# Patient Record
Sex: Female | Born: 1935
Health system: Southern US, Community
[De-identification: ages and names within clinical notes are randomized; demographics above are authoritative.]

## PROBLEM LIST (undated history)

## (undated) DIAGNOSIS — K649 Unspecified hemorrhoids: Secondary | ICD-10-CM

## (undated) DIAGNOSIS — S90221A Contusion of right lesser toe(s) with damage to nail, initial encounter: Secondary | ICD-10-CM

## (undated) DIAGNOSIS — M1990 Unspecified osteoarthritis, unspecified site: Secondary | ICD-10-CM

## (undated) DIAGNOSIS — K2970 Gastritis, unspecified, without bleeding: Secondary | ICD-10-CM

## (undated) DIAGNOSIS — I10 Essential (primary) hypertension: Secondary | ICD-10-CM

## (undated) DIAGNOSIS — S82892A Other fracture of left lower leg, initial encounter for closed fracture: Secondary | ICD-10-CM

## (undated) HISTORY — PX: APPENDECTOMY: SHX54

## (undated) HISTORY — DX: Contusion of right lesser toe(s) with damage to nail, initial encounter: S90.221A

## (undated) HISTORY — PX: BACK SURGERY: SHX140

## (undated) HISTORY — DX: Unspecified hemorrhoids: K64.9

## (undated) HISTORY — DX: Other fracture of left lower leg, initial encounter for closed fracture: S82.892A

## (undated) HISTORY — PX: TRABECULECTOMY: SHX107

## (undated) HISTORY — PX: LUMBAR DISC SURGERY: SHX700

## (undated) HISTORY — DX: Gastritis, unspecified, without bleeding: K29.70

## (undated) HISTORY — PX: CRYOABLATION: SHX1415

## (undated) HISTORY — PX: ABDOMINAL HYSTERECTOMY: SHX81

---
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
## 1985-08-02 HISTORY — PX: CHOLECYSTECTOMY: SHX55

## 1986-08-02 HISTORY — PX: VAGINAL HYSTERECTOMY: SHX2639

## 1997-10-31 ENCOUNTER — Encounter (INDEPENDENT_AMBULATORY_CARE_PROVIDER_SITE_OTHER): Payer: Self-pay | Admitting: *Deleted

## 1997-10-31 LAB — CONVERTED CEMR LAB

## 1997-11-19 ENCOUNTER — Encounter: Admission: RE | Admit: 1997-11-19 | Discharge: 1997-11-19 | Payer: Self-pay | Admitting: Family Medicine

## 1997-11-27 ENCOUNTER — Encounter: Admission: RE | Admit: 1997-11-27 | Discharge: 1997-11-27 | Payer: Self-pay | Admitting: Family Medicine

## 1998-10-01 DIAGNOSIS — S82892A Other fracture of left lower leg, initial encounter for closed fracture: Secondary | ICD-10-CM

## 1998-10-01 HISTORY — DX: Other fracture of left lower leg, initial encounter for closed fracture: S82.892A

## 1998-10-16 ENCOUNTER — Encounter: Admission: RE | Admit: 1998-10-16 | Discharge: 1998-10-16 | Payer: Self-pay | Admitting: Family Medicine

## 1999-04-28 ENCOUNTER — Encounter: Admission: RE | Admit: 1999-04-28 | Discharge: 1999-04-28 | Payer: Self-pay | Admitting: Family Medicine

## 1999-05-12 ENCOUNTER — Encounter: Admission: RE | Admit: 1999-05-12 | Discharge: 1999-05-12 | Payer: Self-pay | Admitting: Family Medicine

## 1999-09-14 ENCOUNTER — Encounter: Payer: Self-pay | Admitting: Family Medicine

## 1999-09-14 ENCOUNTER — Encounter: Admission: RE | Admit: 1999-09-14 | Discharge: 1999-09-14 | Payer: Self-pay | Admitting: Family Medicine

## 1999-11-03 ENCOUNTER — Encounter: Admission: RE | Admit: 1999-11-03 | Discharge: 1999-11-03 | Payer: Self-pay | Admitting: Sports Medicine

## 2000-03-07 ENCOUNTER — Encounter: Payer: Self-pay | Admitting: Family Medicine

## 2000-03-07 ENCOUNTER — Encounter: Admission: RE | Admit: 2000-03-07 | Discharge: 2000-03-07 | Payer: Self-pay | Admitting: Obstetrics and Gynecology

## 2000-05-10 ENCOUNTER — Encounter: Admission: RE | Admit: 2000-05-10 | Discharge: 2000-05-10 | Payer: Self-pay | Admitting: Family Medicine

## 2000-05-30 ENCOUNTER — Encounter: Admission: RE | Admit: 2000-05-30 | Discharge: 2000-05-30 | Payer: Self-pay | Admitting: Family Medicine

## 2000-07-18 ENCOUNTER — Encounter: Admission: RE | Admit: 2000-07-18 | Discharge: 2000-07-18 | Payer: Self-pay | Admitting: Family Medicine

## 2000-09-19 ENCOUNTER — Encounter: Admission: RE | Admit: 2000-09-19 | Discharge: 2000-09-19 | Payer: Self-pay | Admitting: Family Medicine

## 2000-09-19 ENCOUNTER — Encounter: Payer: Self-pay | Admitting: Family Medicine

## 2000-11-15 ENCOUNTER — Encounter: Admission: RE | Admit: 2000-11-15 | Discharge: 2000-11-15 | Payer: Self-pay | Admitting: Family Medicine

## 2001-06-06 ENCOUNTER — Encounter: Admission: RE | Admit: 2001-06-06 | Discharge: 2001-06-06 | Payer: Self-pay | Admitting: Family Medicine

## 2001-09-12 ENCOUNTER — Encounter: Payer: Self-pay | Admitting: Family Medicine

## 2001-09-12 ENCOUNTER — Encounter: Admission: RE | Admit: 2001-09-12 | Discharge: 2001-09-12 | Payer: Self-pay | Admitting: Family Medicine

## 2001-12-19 ENCOUNTER — Encounter: Admission: RE | Admit: 2001-12-19 | Discharge: 2001-12-19 | Payer: Self-pay | Admitting: Family Medicine

## 2002-03-08 ENCOUNTER — Encounter: Admission: RE | Admit: 2002-03-08 | Discharge: 2002-03-08 | Payer: Self-pay | Admitting: Family Medicine

## 2002-09-14 ENCOUNTER — Encounter: Admission: RE | Admit: 2002-09-14 | Discharge: 2002-09-14 | Payer: Self-pay | Admitting: Family Medicine

## 2002-09-14 ENCOUNTER — Encounter: Payer: Self-pay | Admitting: Family Medicine

## 2003-02-12 ENCOUNTER — Encounter: Admission: RE | Admit: 2003-02-12 | Discharge: 2003-02-12 | Payer: Self-pay | Admitting: Family Medicine

## 2003-09-24 ENCOUNTER — Encounter: Admission: RE | Admit: 2003-09-24 | Discharge: 2003-09-24 | Payer: Self-pay | Admitting: Family Medicine

## 2003-09-26 ENCOUNTER — Encounter: Admission: RE | Admit: 2003-09-26 | Discharge: 2003-09-26 | Payer: Self-pay | Admitting: Family Medicine

## 2003-11-13 ENCOUNTER — Encounter: Admission: RE | Admit: 2003-11-13 | Discharge: 2003-11-13 | Payer: Self-pay | Admitting: Family Medicine

## 2003-12-03 ENCOUNTER — Encounter: Admission: RE | Admit: 2003-12-03 | Discharge: 2003-12-03 | Payer: Self-pay | Admitting: Family Medicine

## 2004-01-31 HISTORY — PX: BLADDER SUSPENSION: SHX72

## 2004-02-11 ENCOUNTER — Encounter: Admission: RE | Admit: 2004-02-11 | Discharge: 2004-02-11 | Payer: Self-pay | Admitting: Urology

## 2004-02-14 ENCOUNTER — Ambulatory Visit (HOSPITAL_BASED_OUTPATIENT_CLINIC_OR_DEPARTMENT_OTHER): Admission: RE | Admit: 2004-02-14 | Discharge: 2004-02-14 | Payer: Self-pay | Admitting: Urology

## 2004-02-14 ENCOUNTER — Ambulatory Visit (HOSPITAL_COMMUNITY): Admission: RE | Admit: 2004-02-14 | Discharge: 2004-02-14 | Payer: Self-pay | Admitting: Urology

## 2004-10-13 ENCOUNTER — Encounter: Admission: RE | Admit: 2004-10-13 | Discharge: 2004-10-13 | Payer: Self-pay | Admitting: Family Medicine

## 2005-01-26 ENCOUNTER — Ambulatory Visit: Payer: Self-pay | Admitting: Family Medicine

## 2005-10-20 ENCOUNTER — Encounter: Admission: RE | Admit: 2005-10-20 | Discharge: 2005-10-20 | Payer: Self-pay | Admitting: Family Medicine

## 2006-04-18 ENCOUNTER — Ambulatory Visit: Payer: Self-pay | Admitting: Family Medicine

## 2006-05-02 ENCOUNTER — Ambulatory Visit: Payer: Self-pay | Admitting: Family Medicine

## 2006-07-02 DIAGNOSIS — K2970 Gastritis, unspecified, without bleeding: Secondary | ICD-10-CM

## 2006-07-02 HISTORY — DX: Gastritis, unspecified, without bleeding: K29.70

## 2006-09-29 DIAGNOSIS — N6029 Fibroadenosis of unspecified breast: Secondary | ICD-10-CM

## 2006-09-29 DIAGNOSIS — E785 Hyperlipidemia, unspecified: Secondary | ICD-10-CM | POA: Insufficient documentation

## 2006-09-29 DIAGNOSIS — N819 Female genital prolapse, unspecified: Secondary | ICD-10-CM | POA: Insufficient documentation

## 2006-09-29 DIAGNOSIS — I10 Essential (primary) hypertension: Secondary | ICD-10-CM | POA: Insufficient documentation

## 2006-09-29 DIAGNOSIS — E663 Overweight: Secondary | ICD-10-CM | POA: Insufficient documentation

## 2006-09-29 DIAGNOSIS — K649 Unspecified hemorrhoids: Secondary | ICD-10-CM

## 2006-09-29 DIAGNOSIS — H409 Unspecified glaucoma: Secondary | ICD-10-CM | POA: Insufficient documentation

## 2006-09-29 DIAGNOSIS — M5430 Sciatica, unspecified side: Secondary | ICD-10-CM

## 2006-09-29 HISTORY — DX: Unspecified hemorrhoids: K64.9

## 2006-09-30 ENCOUNTER — Encounter (INDEPENDENT_AMBULATORY_CARE_PROVIDER_SITE_OTHER): Payer: Self-pay | Admitting: *Deleted

## 2006-09-30 ENCOUNTER — Ambulatory Visit: Payer: Self-pay | Admitting: Family Medicine

## 2006-09-30 ENCOUNTER — Encounter: Admission: RE | Admit: 2006-09-30 | Discharge: 2006-09-30 | Payer: Self-pay | Admitting: Sports Medicine

## 2006-10-04 ENCOUNTER — Ambulatory Visit: Payer: Self-pay | Admitting: Family Medicine

## 2006-10-14 ENCOUNTER — Telehealth: Payer: Self-pay | Admitting: Family Medicine

## 2006-10-17 ENCOUNTER — Telehealth: Payer: Self-pay | Admitting: *Deleted

## 2006-11-02 ENCOUNTER — Encounter: Payer: Self-pay | Admitting: Family Medicine

## 2006-11-02 ENCOUNTER — Encounter: Admission: RE | Admit: 2006-11-02 | Discharge: 2006-11-02 | Payer: Self-pay | Admitting: Family Medicine

## 2007-11-06 ENCOUNTER — Encounter: Admission: RE | Admit: 2007-11-06 | Discharge: 2007-11-06 | Payer: Self-pay | Admitting: Family Medicine

## 2008-01-15 ENCOUNTER — Encounter: Payer: Self-pay | Admitting: Family Medicine

## 2008-01-17 ENCOUNTER — Encounter: Payer: Self-pay | Admitting: Family Medicine

## 2008-06-13 ENCOUNTER — Ambulatory Visit: Payer: Self-pay | Admitting: Family Medicine

## 2008-06-13 LAB — CONVERTED CEMR LAB
CO2: 24 meq/L (ref 19–32)
Creatinine, Ser: 0.88 mg/dL (ref 0.40–1.20)
HCT: 47.7 % — ABNORMAL HIGH (ref 36.0–46.0)
HDL: 55 mg/dL (ref >39)
MCV: 95.2 fL (ref 78.0–100.0)
Potassium: 4 meq/L (ref 3.5–5.3)
RDW: 12.7 % (ref 11.5–15.5)
Sodium: 142 meq/L (ref 135–145)
Total CHOL/HDL Ratio: 2.8
Total Protein: 7.3 g/dL (ref 6.0–8.3)
Vit D, 1,25-Dihydroxy: 34 (ref 30–89)
WBC: 5.7 10*3/uL (ref 4.0–10.5)

## 2008-06-18 ENCOUNTER — Encounter: Payer: Self-pay | Admitting: Family Medicine

## 2008-07-22 ENCOUNTER — Telehealth: Payer: Self-pay | Admitting: *Deleted

## 2008-07-23 ENCOUNTER — Ambulatory Visit: Payer: Self-pay | Admitting: Family Medicine

## 2008-08-02 HISTORY — PX: SPIDER VEIN INJECTION: SHX783

## 2008-11-06 ENCOUNTER — Encounter: Admission: RE | Admit: 2008-11-06 | Discharge: 2008-11-06 | Payer: Self-pay | Admitting: Family Medicine

## 2009-05-13 ENCOUNTER — Ambulatory Visit: Payer: Self-pay | Admitting: Family Medicine

## 2009-05-13 LAB — CONVERTED CEMR LAB
ALT: 23 U/L
AST: 27 U/L
Albumin: 4.5 g/dL
Alkaline Phosphatase: 50 U/L
BUN: 17 mg/dL
CO2: 25 meq/L
Calcium: 10.2 mg/dL
Chloride: 101 meq/L
Cholesterol, target level: 200 mg/dL
Cholesterol: 137 mg/dL
Creatinine, Ser: 0.92 mg/dL
Glucose, Bld: 91 mg/dL
HDL goal, serum: 40 mg/dL
HDL: 59 mg/dL
LDL Cholesterol: 61 mg/dL
LDL Goal: 130 mg/dL
Potassium: 3.8 meq/L
Sodium: 142 meq/L
Total Bilirubin: 0.6 mg/dL
Total CHOL/HDL Ratio: 2.3
Total Protein: 7.2 g/dL
Triglycerides: 87 mg/dL
VLDL: 17 mg/dL

## 2009-05-15 ENCOUNTER — Encounter: Payer: Self-pay | Admitting: Family Medicine

## 2009-06-20 ENCOUNTER — Encounter: Payer: Self-pay | Admitting: Family Medicine

## 2009-07-22 DIAGNOSIS — I872 Venous insufficiency (chronic) (peripheral): Secondary | ICD-10-CM

## 2009-08-19 ENCOUNTER — Telehealth: Payer: Self-pay | Admitting: *Deleted

## 2009-08-19 ENCOUNTER — Ambulatory Visit: Payer: Self-pay | Admitting: Family Medicine

## 2009-09-09 ENCOUNTER — Telehealth: Payer: Self-pay | Admitting: Family Medicine

## 2009-11-07 ENCOUNTER — Encounter: Admission: RE | Admit: 2009-11-07 | Discharge: 2009-11-07 | Payer: Self-pay | Admitting: Family Medicine

## 2009-11-17 ENCOUNTER — Ambulatory Visit: Payer: Self-pay | Admitting: Family Medicine

## 2009-11-17 LAB — CONVERTED CEMR LAB
CO2: 28 meq/L (ref 19–32)
Calcium: 10.1 mg/dL (ref 8.4–10.5)
Chloride: 101 meq/L (ref 96–112)
Creatinine, Ser: 0.89 mg/dL (ref 0.40–1.20)
Eosinophils Absolute: 0.3 10*3/uL (ref 0.0–0.7)
Hemoglobin: 15.7 g/dL — ABNORMAL HIGH (ref 12.0–15.0)
Lymphocytes Relative: 37 % (ref 12–46)
Lymphs Abs: 2.2 10*3/uL (ref 0.7–4.0)
MCHC: 33.3 g/dL (ref 30.0–36.0)
Monocytes Relative: 7 % (ref 3–12)
Neutro Abs: 3.1 10*3/uL (ref 1.7–7.7)
Neutrophils Relative %: 52 % (ref 43–77)
Platelets: 225 10*3/uL (ref 150–400)
RBC: 4.9 M/uL (ref 3.87–5.11)
RDW: 12.8 % (ref 11.5–15.5)
WBC: 6 10*3/uL (ref 4.0–10.5)

## 2009-11-20 ENCOUNTER — Ambulatory Visit: Payer: Self-pay | Admitting: Family Medicine

## 2009-12-05 ENCOUNTER — Encounter: Admission: RE | Admit: 2009-12-05 | Discharge: 2009-12-05 | Payer: Self-pay | Admitting: Family Medicine

## 2009-12-30 ENCOUNTER — Encounter: Payer: Self-pay | Admitting: Family Medicine

## 2010-05-25 ENCOUNTER — Telehealth: Payer: Self-pay | Admitting: *Deleted

## 2010-05-25 ENCOUNTER — Encounter: Payer: Self-pay | Admitting: Family Medicine

## 2010-05-28 ENCOUNTER — Encounter: Payer: Self-pay | Admitting: Family Medicine

## 2010-05-28 ENCOUNTER — Telehealth: Payer: Self-pay | Admitting: Family Medicine

## 2010-05-28 ENCOUNTER — Ambulatory Visit: Payer: Self-pay | Admitting: Family Medicine

## 2010-05-28 LAB — CONVERTED CEMR LAB
ALT: 14 units/L (ref 0–35)
CO2: 30 meq/L (ref 19–32)
Chloride: 101 meq/L (ref 96–112)
Cholesterol: 152 mg/dL (ref 0–200)
HDL: 58 mg/dL (ref >39)
LDL Cholesterol: 74 mg/dL (ref 0–99)
Potassium: 4.8 meq/L (ref 3.5–5.3)
Sodium: 142 meq/L (ref 135–145)
Total Bilirubin: 0.6 mg/dL (ref 0.3–1.2)

## 2010-05-29 ENCOUNTER — Encounter: Payer: Self-pay | Admitting: Family Medicine

## 2010-06-07 ENCOUNTER — Encounter: Payer: Self-pay | Admitting: Family Medicine

## 2010-06-11 ENCOUNTER — Ambulatory Visit: Payer: Self-pay | Admitting: Family Medicine

## 2010-06-11 DIAGNOSIS — E739 Lactose intolerance, unspecified: Secondary | ICD-10-CM | POA: Insufficient documentation

## 2010-06-11 LAB — CONVERTED CEMR LAB
Calcium: 11.1 mg/dL — ABNORMAL HIGH (ref 8.4–10.5)
Chloride: 97 meq/L (ref 96–112)
Creatinine, Ser: 1.04 mg/dL (ref 0.40–1.20)
Glucose, Bld: 104 mg/dL — ABNORMAL HIGH (ref 70–99)
Potassium: 4.3 meq/L (ref 3.5–5.3)
Sodium: 144 meq/L (ref 135–145)

## 2010-06-12 ENCOUNTER — Encounter: Payer: Self-pay | Admitting: *Deleted

## 2010-06-15 ENCOUNTER — Encounter: Payer: Self-pay | Admitting: Family Medicine

## 2010-06-15 DIAGNOSIS — N183 Chronic kidney disease, stage 3 (moderate): Secondary | ICD-10-CM

## 2010-06-29 ENCOUNTER — Telehealth: Payer: Self-pay | Admitting: Family Medicine

## 2010-07-03 ENCOUNTER — Encounter: Payer: Self-pay | Admitting: Family Medicine

## 2010-09-03 NOTE — Miscellaneous (Signed)
Summary: Stage 3 renal insufficiency,Calcium 11.1  Clinical Lists Changes  Problems: Added new problem of CHRONIC KIDNEY DISEASE STAGE III (MODERATE) (ICD-585.3)

## 2010-09-03 NOTE — Assessment & Plan Note (Signed)
Summary: infected 5th finger/[place]/Xaidyn Kepner   Vital Signs:  Patient profile:   77 year old female Menstrual status:  hysterectomy Height:      63 inches Weight:      157 pounds BMI:     27.91 BSA:     1.75 Temp:     98.1 degrees F Pulse rate:   69 / minute BP sitting:   146 / 83  Vitals Entered By: Jone Baseman CMA (November 17, 2009 9:24 AM) CC: infected finger Is Patient Diabetic? No Pain Assessment Patient in pain? yes     Location: finger   Primary Care Provider:  Zachery Dauer  CC:  infected finger.  History of Present Illness: Got a splinter in her L little finger 7 days ago. Got it out with a sterilized pin, but it has remained swollen despite applying fat back. Hurting more.   Lost weight by portion control and limiting carbs. Also increased walking. Came in fasting in case she needs blood drawn. Hasn't been checking her blood pressure, but can measure at home.   Habits & Providers  Alcohol-Tobacco-Diet     Tobacco Status: never  Current Medications (verified): 1)  Bayer Childrens Aspirin 81 Mg Chew (Aspirin) .... Take 1 Tablet Once A Day 2)  Caltrate 600+d Plus 600-400 Mg-Unit Tabs (Calcium Carbonate-Vit D-Min) .... Take 1 Tablet By Mouth Twice A Day 3)  Hydrochlorothiazide 25 Mg Tabs (Hydrochlorothiazide) .... Take One Tablet Daily 4)  Simvastatin 20 Mg Tabs (Simvastatin) .... Take One Tab Daily 5)  Tylenol 325 Mg Tabs (Acetaminophen) .... Take 2 Tablets 3 X Day. 6)  Tramadol Hcl 50 Mg Tabs (Tramadol Hcl) .... Take 1 Tablet Every 8 Hours As Needed For Pain 7)  Enalapril Maleate 5 Mg Tabs (Enalapril Maleate) .... Take One Tablet Daily  Allergies (verified): No Known Drug Allergies  Physical Exam  General:  Thinner appearing Extremities:  left 5th finger tuft reddened and mildly swollen. Closed over entry wound. 1 cm incision made from there proximally after digital block.  Neurologic:  Normal finger sensation   Impression & Recommendations:  Problem # 1:   FOREIGN BODY, FINGER (ICD-915.6)  Splinter removed. No antibiotic unless symptoms of infection develop  Orders: [facility]- Est Level  3 ([address])  Problem # 2:  OVERWEIGHT (ICD-278.02)  Congratulated on her weight loss. Goal of 7 pounds over 4 months discussed. Set action plan next visit  Orders: [facility]- Est Level  3 (60454)  Problem # 3:  RIB PAIN, LEFT SIDED (ICD-786.50)  Finally improving.   Orders: [facility]- Est Level  3 (09811)  Problem # 4:  HYPERTENSION, BENIGN SYSTEMIC (ICD-401.1) Still elevated. She'll check at home and we'll remeasure in 3 days to decide if medication increase indicated.  Her updated medication list for this problem includes:    Hydrochlorothiazide 25 Mg Tabs (Hydrochlorothiazide) .Marland Kitchen... Take one tablet daily    Enalapril Maleate 5 Mg Tabs (Enalapril maleate) .Marland Kitchen... Take one tablet daily  Orders: Basic Met-[facility] [phone]) CBC w/Diff-[facility] ([address]) [facility]- Est Level  3 (57846)  Complete Medication List: 1)  Bayer Childrens Aspirin 81 Mg Chew (Aspirin) .... Take 1 tablet once a day 2)  Caltrate 600+d Plus 600-400 Mg-unit Tabs (Calcium carbonate-vit d-min) .... Take 1 tablet by mouth twice a day 3)  Hydrochlorothiazide 25 Mg Tabs (Hydrochlorothiazide) .... Take one tablet daily 4)  Simvastatin 20 Mg Tabs (Simvastatin) .... Take one tab daily 5)  Tylenol 325 Mg Tabs (Acetaminophen) .... Take 2 tablets 3 x day. 6)  Tramadol Hcl 50 Mg Tabs (Tramadol hcl) .... Take 1 tablet every 8 hours as needed for pain 7)  Enalapril Maleate 5 Mg Tabs (Enalapril maleate) .... Take one tablet daily  Patient Instructions: 1)  Please come to [facility] in 3 days to check your finger and blood pressure.  Call if any signs of infection  Change the dressing tomorrow, sooner if it bleeds through. Apply antibiotic ointment to the cut with dressing changes.  2)  Your blood pressure today is 146/83  Procedure Note Last Tetanus: given (06/13/2008)  Foreign Body Removal: Date of onset:  11/10/2009 Indication: inflamed lesion Work related: no  Procedure # 1: incision-dissection & FB removal    Type of foreign body: wood    Region: palmar    Location: hand-palmar-left    Comment: fifth finger tip, no pus,  1.5 cm thin spinter removed    Instrument used: #11 blade-splinter forceps    Anesthesia: 1% lidocaine w/o epinephrine  Cleaned and prepped with: alcohol and betadine Wound dressing: bulky gauze dressing Instructions: daily dressing changes and RTC in 3 days  Prescriptions: SIMVASTATIN 20 MG TABS (SIMVASTATIN) Take one tab daily  #90 x 3   Entered and Authorized by:   Myrtie Soman  MD   Signed by:   Zachery Dauer MD on 11/17/2009   Method used:   Historical   RxID:   [id number]     Prevention & Chronic Care Immunizations   Influenza vaccine: Historical  (05/13/2009)   Influenza vaccine due: 04/26/2009    Tetanus booster: 06/13/2008: given   Tetanus booster due: 06/13/2018    Pneumococcal vaccine: Done.  (07/08/1998)   Pneumococcal vaccine due: None    H. zoster vaccine: Not documented  Colorectal Screening   Hemoccult: Done.  (01/30/2005)   Hemoccult due: Not Indicated    Colonoscopy: Done.  (06/02/2006)   Colonoscopy due: 06/02/2016  Other Screening   Pap smear: Done.  (10/31/1997)   Pap smear due: Not Indicated    Mammogram: ASSESSMENT: Negative - BI-RADS 1^MM DIGITAL SCREENING  (11/07/2009)   Mammogram due: 11/08/2010    DXA bone density scan: Done.  (03/02/2002)   DXA scan due: None    Smoking status: never  (11/17/2009)  Lipids   Total Cholesterol: 137  (05/13/2009)   Lipid panel action/deferral: Lipid Panel ordered   LDL: 61  (05/13/2009)   LDL Direct: Not documented   HDL: 59  (05/13/2009)   Triglycerides: 87  (05/13/2009)   Lipid panel due: 05/13/2010    SGOT (AST): 27  (05/13/2009)   SGPT (ALT): 23  (05/13/2009)   Alkaline phosphatase: 50  (05/13/2009)   Total bilirubin: 0.6  (05/13/2009)   Liver panel due:  05/13/2010  Hypertension   Last Blood Pressure: 146 / 83  (11/17/2009)   Serum creatinine: 0.92  (05/13/2009)   Serum potassium 3.8  (05/13/2009)  Self-Management Support :   Personal Goals (by the next clinic visit) :      Personal blood pressure goal: 140/90  (05/13/2009)     Personal LDL goal: 130  (05/13/2009)    Hypertension self-management support: BP self-monitoring log  (05/13/2009)    Lipid self-management support: Education handout  (05/13/2009)

## 2010-09-03 NOTE — Assessment & Plan Note (Signed)
Summary: recheck finger and bp /KH  Nurse Visit   Vital Signs:  Patient profile:   77 year old female Menstrual status:  hysterectomy Height:      63 inches Weight:      152 pounds BMI:     27.02 Temp:     97.6 degrees F oral Pulse rate:   72 / minute BP sitting:   151 / 80  (left arm) Cuff size:   regular  Vitals Entered By: Tessie Fass CMA (November 20, 2009 9:19 AM)  Serial Vital Signs/Assessments:  Time      Position  BP       Pulse  Resp  Temp     By                     126/76                         Zachery Dauer MD   Primary Care Provider:  Zachery Dauer  CC:  F/U splinter removal from finger and Hypertension Management.  History of Present Illness: The finger is improved without signs of infection.   Still hurting left side of chest.   Hypertension History:      She complains of peripheral edema and visual symptoms, but denies headache, chest pain, palpitations, dyspnea with exertion, orthopnea, PND, neurologic problems, syncope, and side effects from treatment.  She notes no problems with any antihypertensive medication side effects.  Further comments include: Usually wears support stockings Blurry vision Opth May 5 for glaucoma laser follow-up .        Positive major cardiovascular risk factors include female age 61 years old or older, hyperlipidemia, and hypertension.  Negative major cardiovascular risk factors include no history of diabetes and non-tobacco-user status.        Further assessment for target organ damage reveals no history of ASHD, stroke/TIA, or peripheral vascular disease.     CC: F/U splinter removal from finger, Hypertension Management Pain Assessment Patient in pain? no        Current Medications (verified): 1)  Bayer Childrens Aspirin 81 Mg Chew (Aspirin) .... Take 1 Tablet Once A Day 2)  Caltrate 600+d Plus 600-400 Mg-Unit Tabs (Calcium Carbonate-Vit D-Min) .... Take 1 Tablet By Mouth Twice A Day 3)  Hydrochlorothiazide 25 Mg Tabs  (Hydrochlorothiazide) .... Take One Tablet Daily 4)  Simvastatin 20 Mg Tabs (Simvastatin) .... Take One Tab Daily 5)  Tylenol 325 Mg Tabs (Acetaminophen) .... Take 2 Tablets 3 X Day. 6)  Tramadol Hcl 50 Mg Tabs (Tramadol Hcl) .... Take 1 Tablet Every 8 Hours As Needed For Pain 7)  Enalapril Maleate 5 Mg Tabs (Enalapril Maleate) .... Take One Tablet Daily  Allergies (verified): No Known Drug Allergies  Orders Added: 1)  [facility]- Est Level  3 [72536] 2)  Dexa scan [Dexa scan]   Prevention & Chronic Care Immunizations   Influenza vaccine: Historical  (05/13/2009)   Influenza vaccine due: 04/26/2009    Tetanus booster: 06/13/2008: given   Tetanus booster due: 06/13/2018    Pneumococcal vaccine: Done.  (07/08/1998)   Pneumococcal vaccine due: None    H. zoster vaccine: Not documented  Colorectal Screening   Hemoccult: Done.  (01/30/2005)   Hemoccult due: Not Indicated    Colonoscopy: Done.  (06/02/2006)   Colonoscopy due: 06/02/2016  Other Screening   Pap smear: Done.  (10/31/1997)   Pap smear due: Not Indicated    Mammogram: ASSESSMENT: Negative -  BI-RADS 1^MM DIGITAL SCREENING  (11/07/2009)   Mammogram due: 11/08/2010    DXA bone density scan: Done.  (03/02/2002)   DXA scan due: None    Smoking status: never  (11/17/2009)  Lipids   Total Cholesterol: 137  (05/13/2009)   Lipid panel action/deferral: Lipid Panel ordered   LDL: 61  (05/13/2009)   LDL Direct: Not documented   HDL: 59  (05/13/2009)   Triglycerides: 87  (05/13/2009)   Lipid panel due: 05/13/2010    SGOT (AST): 27  (05/13/2009)   SGPT (ALT): 23  (05/13/2009)   Alkaline phosphatase: 50  (05/13/2009)   Total bilirubin: 0.6  (05/13/2009)   Liver panel due: 05/13/2010    Lipid flowsheet reviewed?: Yes   Progress toward LDL goal: At goal  Hypertension   Last Blood Pressure: 151 / 80  (11/20/2009)   Serum creatinine: 0.89  (11/17/2009)   Serum potassium 4.8  (11/17/2009)    Hypertension flowsheet  reviewed?: Yes   Progress toward BP goal: Improved   Hypertension comments: BP at home 4/20 was 126/76  Self-Management Support :   Personal Goals (by the next clinic visit) :      Personal blood pressure goal: 140/90  (05/13/2009)     Personal LDL goal: 130  (05/13/2009)    Hypertension self-management support: BP self-monitoring log  (05/13/2009)    Lipid self-management support: Education handout  (05/13/2009)      Patient Instructions: 1)  Please schedule a follow-up appointment in 6 months .  2)  Please return for a FASTING Lipid Profile one(1) week before your next appointment as scheduled.  3)  Call the office when nearing time to refill HDTZ and Enalapril and we'll order a combo pill 4)  Limit your Sodium(salt) .     Physical Exam  General:  alert.  Thinner Chest Wall:  Tender with pressure over left lower lateral ribs Lungs:  Normal respiratory effort, chest expands symmetrically. Lungs are clear to auscultation, no crackles or wheezes.  Good air movement throughout all lobes bilateral including site of injury. Heart:  Normal rate and regular rhythm. S1 and S2 normal without gallop, murmur, click, rub or other extra sounds. Extremities:  left 5th finger tuft wound closed without signs of infection 1+ left pedal edema and 1+ right pedal edema.        Impression & Recommendations:  Problem # 1:  FOREIGN BODY, FINGER (ICD-915.6) healing without infection Orders: [facility]- Est Level  3 ([address])  Problem # 2:  RIB PAIN, LEFT SIDED (ICD-786.50) Persists. Due for Dexa and will make sure osteoporosis not contributing to slow healing Orders: [facility]- Est Level  3 (99213) Dexa scan (Dexa scan)  Problem # 3:  HYPERTENSION, BENIGN SYSTEMIC (ICD-401.1) Consider increasing Enalapril to 10 mg if remains elevated, but home measure reassuring Her updated medication list for this problem includes:    Hydrochlorothiazide 25 Mg Tabs (Hydrochlorothiazide) .Marland Kitchen... Take one tablet daily     Enalapril Maleate 5 Mg Tabs (Enalapril maleate) .Marland Kitchen... Take one tablet daily  Orders: [facility]- Est Level  3 (60454)  Problem # 4:  OVERWEIGHT (ICD-278.02) Congratulated on weight loss.  Orders: [facility]- Est Level  3 (09811)  Complete Medication List: 1)  Bayer Childrens Aspirin 81 Mg Chew (Aspirin) .... Take 1 tablet once a day 2)  Caltrate 600+d Plus 600-400 Mg-unit Tabs (Calcium carbonate-vit d-min) .... Take 1 tablet by mouth twice a day 3)  Hydrochlorothiazide 25 Mg Tabs (Hydrochlorothiazide) .... Take one tablet daily 4)  Simvastatin  20 Mg Tabs (Simvastatin) .... Take one tab daily 5)  Tylenol 325 Mg Tabs (Acetaminophen) .... Take 2 tablets 3 x day. 6)  Tramadol Hcl 50 Mg Tabs (Tramadol hcl) .... Take 1 tablet every 8 hours as needed for pain 7)  Enalapril Maleate 5 Mg Tabs (Enalapril maleate) .... Take one tablet daily  Hypertension Assessment/Plan:      The patient's hypertensive risk group is category B: At least one risk factor (excluding diabetes) with no target organ damage.  Her calculated 10 year risk of coronary heart disease is 9 %.  Today's blood pressure is 151/80.  Her blood pressure goal is < 140/90.

## 2010-09-03 NOTE — Progress Notes (Signed)
Summary: triage  Phone Note Call from Patient Call back at Home Phone [phone]   Caller: Patient Summary of Call: Pt fell last Friday and has hurting in back and wondered if she can be seen today. Initial call taken by: Clydell Hakim,  August 19, 2009 9:45 AM  Follow-up for Phone Call        taking advil. fell onto back. able to move everything well. hurts to breathe deeply. wearing a back brace to help pain. work in at 1:30. aware she will not be seeing her md & that there will be a wait. husband will drive her Follow-up by: Golden Circle RN,  August 19, 2009 9:52 AM

## 2010-09-03 NOTE — Miscellaneous (Signed)
  Clinical Lists Changes  Observations: Added new observation of FLU VAX: Historical (05/05/2010 12:15)      Immunization History:  Influenza Immunization History:    Influenza:  historical (05/05/2010)

## 2010-09-03 NOTE — Progress Notes (Signed)
Summary: Rx Req Enalapril  Phone Note Refill Request Call back at Home Phone [phone] Message from:  Patient  Refills Requested: Medication #1:  ENALAPRIL MALEATE 5 MG TABS Take one tablet daily [facility]  Initial call taken by: Clydell Hakim,  May 28, 2010 11:35 AM    New/Updated Medications: ENALAPRIL MALEATE 10 MG TABS (ENALAPRIL MALEATE) Take one tab daily Prescriptions: ENALAPRIL MALEATE 10 MG TABS (ENALAPRIL MALEATE) Take one tab daily  #30 x 11   Entered and Authorized by:   Zachery Dauer MD   Signed by:   Zachery Dauer MD on 05/28/2010   Method used:   Electronically to        [facility]* (retail)       [address]       [address] Shipping/mailing       [place], [place]  [address]       Ph: [phone]       Fax: [phone]   RxID:   [id number]  Use of larger tabs discussed with the patient by phone. She only has enough of the 5 mg to last 5 days.

## 2010-09-03 NOTE — Letter (Signed)
Summary: Results Follow-up Letter  [facility]  [address]   [place], [place] [address]   Phone: [phone]  Fax: [phone]    06/15/2010  [address] [place], [place]  [address]  Dear Ms. Dada,   The following are the results of your recent test(s):  Patient: Amanda Shaw  Tests: (1) BMP with Estimated GFR (2404)   Sodium                    144 mEq/L                   135-145   Potassium                 4.3 mEq/L                   3.5-5.3   Chloride                  97 mEq/L                    96-112   CO2                       32 mEq/L                    19-32   Glucose              [H]  104 mg/dL                   29-52   BUN                       21 mg/dL                    8-41   Creatinine                1.04 mg/dL                  0.40-1.20   Calcium              [H]  11.1 mg/dL                  3.2-44.0 ! Est GFR, NonAfrican American                        [L]  51 mL/min                   >60 Your calcium is mildly elevated and should be rechecked. Stop taking the Calcium and vitamin D and schedule a blood test in about a month.   Document Creation Date: 06/12/2010 12:38 AM _______________________________________________________________________  Sincerely,  Zachery Dauer MD [facility]           Appended Document: Results Follow-up Letter    Clinical Lists Changes  Orders: Added new Test order of Basic Met-[facility] [phone]) - Signed      Appended Document: Results Follow-up Letter mailed

## 2010-09-03 NOTE — Letter (Signed)
Summary: Results Follow-up Letter  [facility]  [address]   [place], [place] [address]   Phone: [phone]  Fax: [phone]    12/30/2009  [address] [place], [place]  [address]  Dear Ms. Fujiwara,   The following are the results of your recent test(s):  The bones in your forearm are well calcified (no loss of bone)whereas that in your hip showed osteopenia (mild loss of bone). Your should continue the calcium and vitamin D plus walking. We should repeat the test in about 3 years.   Sincerely,  Zachery Dauer MD [facility]            Appended Document: Results Follow-up Letter mailed letter to pt

## 2010-09-03 NOTE — Miscellaneous (Signed)
Summary:  shingles vaccine  Clinical Lists Changes received notification from [facility], [address]  that patient received Zostavax on 07/02/2010 . Theresia Lo RN  July 03, 2010 8:34 AM  Observations: Added new observation of ZOSTAVAX: Zostavax (07/02/2010 8:35)      Other Immunization History:    Zostavax # 1:  Zostavax (07/02/2010)

## 2010-09-03 NOTE — Progress Notes (Signed)
Summary: BP med increase   Phone Note Outgoing Call   Call placed by: Zachery Dauer MD,  September 09, 2009 10:37 AM Call placed to: Patient Summary of Call: BP's she recorded at home predominately above 140 systolic with highest 162, diastolics all under 86. She feels well. Plan: add Enalapril 5 mg one tab daily. She will call for follow-up appointment in one month.  Initial call taken by: patient    New/Updated Medications: ENALAPRIL MALEATE 5 MG TABS (ENALAPRIL MALEATE) Take one tablet daily

## 2010-09-03 NOTE — Assessment & Plan Note (Signed)
Summary: CPE/TS   Vital Signs:  Patient profile:   77 year old female Menstrual status:  hysterectomy Height:      63 inches Weight:      157.1 pounds BMI:     27.93 Pulse rate:   82 / minute BP sitting:   162 / 96  (right arm) Cuff size:   regular  Vitals Entered By: Arlyss Repress CMA, (May 28, 2010 8:39 AM)  Serial Vital Signs/Assessments:  Time      Position  BP       Pulse  Resp  Temp     By 8:53 AM             152/94                         Zachery Dauer MD  CC: CPE. refill meds. Is Patient Diabetic? No Pain Assessment Patient in pain? no        Primary Care Provider:  Zachery Dauer  CC:  CPE. refill meds..  History of Present Illness: Started nasal cogestion and took Coricidine HBP cough and cold Acetaminophen and Chlorpheniramine) last evening. This morning throat congestion, cough. No sore throat, ear ache, myalgias. No ill contacts.   Bumps on anterior neck and R arm for a few days. New one this AM, thinks they are mosquito bites  No back pain.   blood pressures at home systolics 140/?. Didn't take meds this AM.   Sees opthalmologist, Dr Dione Booze annually. No need for glaucoma drops since laser surgery.   Habits & Providers  Alcohol-Tobacco-Diet     Tobacco Status: never  Current Medications (verified): 1)  Bayer Childrens Aspirin 81 Mg Chew (Aspirin) .... Take 1 Tablet Once A Day 2)  Caltrate 600+d Plus 600-400 Mg-Unit Tabs (Calcium Carbonate-Vit D-Min) .... Take 1 Tablet By Mouth Twice A Day 3)  Hydrochlorothiazide 25 Mg Tabs (Hydrochlorothiazide) .... Take One Tablet Daily 4)  Simvastatin 20 Mg Tabs (Simvastatin) .... Take One Tab Daily 5)  Tylenol 325 Mg Tabs (Acetaminophen) .... Take 2 Tablets 3 X Day. 6)  Enalapril Maleate 5 Mg Tabs (Enalapril Maleate) .... Take One Tablet Daily  Allergies (verified): No Known Drug Allergies  Physical Exam  General:  alert and well-nourished.   Nasal congestion voice Eyes:  pupils equal, pupils round, pupils  reactive to light, and conjunctival injection.   Ears:  R ear normal and L ear normal.   Nose:  mucosal erythema.   Mouth:  Moist mucous membranes.  Mild erythema, no exudate. Lower dentures Neck:  No deformities, masses, or tenderness noted. Lungs:  Normal respiratory effort, chest expands symmetrically. Lungs are clear to auscultation, no crackles or wheezes. Heart:  Normal rate and regular rhythm. S1 and S2 normal without gallop, murmur, click, rub or other extra sounds. Abdomen:  Bowel sounds positive,abdomen soft and non-tender without masses, organomegaly or hernias noted.   Extremities:  Wearing compression stockings Skin:  Excoriated papules 2 anterior neck and 3 right upper arm suggestive of insect bites Psych:  Cognition and judgment appear intact. Alert and cooperative with normal attention span and concentration. No apparent delusions, illusions, hallucinations   Impression & Recommendations:  Problem # 1:  HYPERTENSION, BENIGN SYSTEMIC (ICD-401.1)  Not well controlled, increase Enalapril Her updated medication list for this problem includes:    Hydrochlorothiazide 25 Mg Tabs (Hydrochlorothiazide) .Marland Kitchen... Take one tablet daily    Enalapril Maleate 10 Mg Tabs (Enalapril maleate) .Marland Kitchen... Take one tab daily  Orders: [facility]- Est  Level 4 (74259)  Problem # 2:  URI (ICD-465.9)  Her updated medication list for this problem includes:    Bayer Childrens Aspirin 81 Mg Chew (Aspirin) .Marland Kitchen... Take 1 tablet once a day    Tylenol 325 Mg Tabs (Acetaminophen) .Marland Kitchen... Take 2 tablets 3 x day.    Coricidin Hbp Cough/cold 4-30 Mg Tabs (Chlorpheniramine-dm) .Marland Kitchen... Take one tablet daily at bedtime  Orders: [facility]- Est  Level 4 (56387)  Problem # 3:  HYPERLIPIDEMIA (ICD-272.4)  Adjust based on labs today Her updated medication list for this problem includes:    Simvastatin 20 Mg Tabs (Simvastatin) .Marland Kitchen... Take one tab daily  Orders: [facility]- Est  Level 4 (56433)  Problem # 4:  SCIATICA  (ICD-724.3)  Improved The following medications were removed from the medication list:    Tramadol Hcl 50 Mg Tabs (Tramadol hcl) .Marland Kitchen... Take 1 tablet every 8 hours as needed for pain Her updated medication list for this problem includes:    Bayer Childrens Aspirin 81 Mg Chew (Aspirin) .Marland Kitchen... Take 1 tablet once a day    Tylenol 325 Mg Tabs (Acetaminophen) .Marland Kitchen... Take 2 tablets 3 x day.  Orders: [facility]- Est  Level 4 (99214)  Problem # 5:  GLAUCOMA, HX OF (ICD-V13.8) better after laser surgery, Not on drops.   Complete Medication List: 1)  Bayer Childrens Aspirin 81 Mg Chew (Aspirin) .... Take 1 tablet once a day 2)  Caltrate 600+d Plus 600-400 Mg-unit Tabs (Calcium carbonate-vit d-min) .... Take 1 tablet by mouth twice a day 3)  Hydrochlorothiazide 25 Mg Tabs (Hydrochlorothiazide) .... Take one tablet daily 4)  Simvastatin 20 Mg Tabs (Simvastatin) .... Take one tab daily 5)  Tylenol 325 Mg Tabs (Acetaminophen) .... Take 2 tablets 3 x day. 6)  Enalapril Maleate 10 Mg Tabs (Enalapril maleate) .... Take one tab daily 7)  Zostavax 29518 Unt/0.13ml Solr (Zoster vaccine live) .... Inject x 1 8)  Coricidin Hbp Cough/cold 4-30 Mg Tabs (Chlorpheniramine-dm) .... Take one tablet daily at bedtime  Patient Instructions: 1)  Increase Enalapril to two 5 mg tablets daily in addition to the HCTZ one daily.  2)  Please schedule a follow-up appointment in 2 weeks.  3)  Walk 30 minutes 3 x weekly in good shoes 4)  Continue Coricidine 1-2 tabs at bedtime. Could Loratadine 10 mg one daily to dry up nose. Can take Acetaminophen (Tylenol) but not at same time as Coricidine 5)  Get plenty of rest, drink lots of clear liquids, and use Tylenol or Ibuprofen for fever and comfort. Return in 7-10 days if you're not better: sooner if you'er feeling worse.  Prescriptions: ZOSTAVAX 84166 UNT/0.65ML SOLR (ZOSTER VACCINE LIVE) inject x 1  #1 x 0   Entered and Authorized by:   Zachery Dauer MD   Signed by:   Zachery Dauer MD on  05/28/2010   Method used:   Print then Give to Patient   RxID:   [id number] SIMVASTATIN 20 MG TABS (SIMVASTATIN) Take one tab daily  #90 x 3   Entered and Authorized by:   Zachery Dauer MD   Signed by:   Zachery Dauer MD on 05/28/2010   Method used:   Electronically to        [facility] [phone]* (retail)       [address]       [place], [place]  [address]       Ph: [phone]       Fax: [phone]  RxID:   [id number]    Orders Added: 1)  [facility]- Est  Level 4 [[address]]     Prevention & Chronic Care Immunizations   Influenza vaccine: Historical  (05/13/2009)   Influenza vaccine due: 04/26/2009    Tetanus booster: 06/13/2008: given   Tetanus booster due: 06/13/2018    Pneumococcal vaccine: Done.  (07/08/1998)   Pneumococcal vaccine due: None    H. zoster vaccine: Not documented  Colorectal Screening   Hemoccult: Done.  (01/30/2005)   Hemoccult due: Not Indicated    Colonoscopy: Done.  (06/02/2006)   Colonoscopy due: 06/02/2016  Other Screening   Pap smear: Done.  (10/31/1997)   Pap smear due: Not Indicated    Mammogram: ASSESSMENT: Negative - BI-RADS 1^MM DIGITAL SCREENING  (11/07/2009)   Mammogram due: 11/08/2010    DXA bone density scan: Done.  (03/02/2002)   DXA scan due: None    Smoking status: never  (05/28/2010)  Lipids   Total Cholesterol: 137  (05/13/2009)   Lipid panel action/deferral: Lipid Panel ordered   LDL: 61  (05/13/2009)   LDL Direct: Not documented   HDL: 59  (05/13/2009)   Triglycerides: 87  (05/13/2009)   Lipid panel due: 05/13/2010    SGOT (AST): 27  (05/13/2009)   SGPT (ALT): 23  (05/13/2009)   Alkaline phosphatase: 50  (05/13/2009)   Total bilirubin: 0.6  (05/13/2009)   Liver panel due: 05/13/2010    Lipid flowsheet reviewed?: Yes   Progress toward LDL goal: At goal  Hypertension   Last Blood Pressure: 162 / 96  (05/28/2010)   Serum creatinine: 0.89  (11/17/2009)   Serum potassium 4.8  (11/17/2009)     Hypertension flowsheet reviewed?: Yes   Progress toward BP goal: Deteriorated  Self-Management Support :   Personal Goals (by the next clinic visit) :      Personal blood pressure goal: 140/90  (05/13/2009)     Personal LDL goal: 130  (05/13/2009)    Patient will work on the following items until the next clinic visit to reach self-care goals:     Medications and monitoring: take my medicines every day, check my blood pressure  (05/28/2010)     Eating: eat foods that are low in salt  (05/28/2010)     Activity: take a 30 minute walk every day  (05/28/2010)    Hypertension self-management support: BP self-monitoring log  (05/13/2009)    Lipid self-management support: Education handout  (05/13/2009)

## 2010-09-03 NOTE — Miscellaneous (Signed)
Summary: Procedure Consent   Procedure Consent   Imported By: Bradly Bienenstock 11/17/2009 17:10:10  _____________________________________________________________________  External Attachment:    Type:   Image     Comment:   External Document

## 2010-09-03 NOTE — Progress Notes (Signed)
Summary: refill simvastatin  Phone Note Refill Request Call back at [phone] Message from:  Patient  Refills Requested: Medication #1:  SIMVASTATIN 20 MG TABS Take one tab daily   Supply Requested: 3 months [facility] [address]  Initial call taken by: De Nurse,  June 29, 2010 10:12 AM    Prescriptions: SIMVASTATIN 20 MG TABS (SIMVASTATIN) Take one tab daily  #90 x 11   Entered and Authorized by:   Zachery Dauer MD   Signed by:   Zachery Dauer MD on 06/29/2010   Method used:   Electronically to        [facility] [phone]* (retail)       [address]       [place], [place]  [address]       Ph: [phone]       Fax: [phone]   RxID:   [id number]

## 2010-09-03 NOTE — Miscellaneous (Signed)
Summary: walk in  Clinical Lists Changes came in with c/o finger infection. got a splinter in 5th finger of L hand last Tuesday. now red, swollen & painful. warm to touch. placed in 8:30 work in slot.[place]en Circle RN  November 17, 2009 8:52 AM

## 2010-09-03 NOTE — Letter (Signed)
Summary: Results Follow-up Letter  [facility]  [address]   [place], [place] [address]   Phone: [phone]  Fax: [phone]    05/29/2010  [address] [place], [place]  [address]  Dear [name],   The following are the results of your recent test(s): Patient: Amanda Shaw Note: All result statuses are Final unless otherwise noted.  Tests: (1) CMP with Estimated GFR (2402)   Order Note: FASTING   Sodium                    142 mEq/L                   135-145   Potassium                 4.8 mEq/L                   3.5-5.3   Chloride                  101 mEq/L                   96-112   CO2                       30 mEq/L                    19-32   Glucose              [H]  113 mg/dL                   62-95   BUN                       18 mg/dL                    2-84   Creatinine                1.01 mg/dL                  0.40-1.20   Bilirubin, Total          0.6 mg/dL                   1.3-2.4   Alkaline Phosphatase      55 U/L                      39-117   AST/SGOT                  21 U/L                      0-37   ALT/SGPT                  14 U/L                      0-35   Total Protein             7.5 g/dL                    4.0-1.0   Albumin                   4.5 g/dL  3.5-5.2   Calcium                   10.2 mg/dL                  1.6-10.9 ! Est GFR, African American                             >60 mL/min                  >60 ! Est GFR, NonAfrican American                        [L]  53 mL/min                   >60 Your blood sugar is in the prediabetes range which is a reason to reduce weight and increase exercise. Fasting blood sugars above 124 make the diagnosis of diabetes.   Tests: (2) Lipid Profile ([address])   Cholesterol               152 mg/dL                   0-981     ATP III Classification:           < 200        mg/dL        Desirable          200 - 239     mg/dL        Borderline High          >= 240         mg/dL        High         Triglyceride              101 mg/dL                   <191   HDL Cholesterol           58 mg/dL                    >47   Total Chol/HDL Ratio      2.6 Ratio  VLDL Cholesterol (Calc)                             20 mg/dL                    8-29  LDL Cholesterol (Calc)                             74 mg/dL                    5-62        Document Creation Date: 05/29/2010 1:02 AM Cholesterol levels are good. Continue the Simvastatin the same.  LDL(Bad cholesterol):          Your goal is less than:  130     HDL (Good cholesterol):        Your goal is more than:   45 _________________________________________________________  Sincerely,  Zachery Dauer MD [facility]           Appended Document: Results Follow-up Letter mailed.

## 2010-09-03 NOTE — Miscellaneous (Signed)
Summary: RX transfer, needs labs  Prescriptions: SIMVASTATIN 20 MG TABS (SIMVASTATIN) Take one tab daily  #30 x 0   Entered and Authorized by:   Zachery Dauer MD   Signed by:   Zachery Dauer MD on 05/25/2010   Method used:   Electronically to        [facility] [phone]* (retail)       [address]       [place], [place]  [address]       Ph: [phone]       Fax: [phone]   RxID:   [id number]  Patient needs a refill of Simvastatin called in to [facility] [address]. Amanda Shaw  May 25, 2010 8:58 AM

## 2010-09-03 NOTE — Assessment & Plan Note (Signed)
Summary: left rib fracture   Vital Signs:  Patient profile:   77 year old female Menstrual status:  hysterectomy Weight:      166 pounds Pulse rate:   77 / minute BP sitting:   163 / 88  (right arm)  Vitals Entered By: Arlyss Repress CMA, (August 19, 2009 1:40 PM) CC: fell last friday in the grocery store. hit the counter. worse with movements. bruised. Is Patient Diabetic? No Pain Assessment Patient in pain? yes     Location: left flank Intensity: 8 Onset of pain  x 5 days.   Primary Care Provider:  Zachery Dauer  CC:  fell last friday in the grocery store. hit the counter. worse with movements. bruised.Marland Kitchen  History of Present Illness: Left Rib pain: Last Friday was in grocery store feel against counter hitting left side.   Tripped over shopping basket that was sitting on floor.  Pain located over left lateral ribs.  Pain with deep breath.  Using a supportive back "lifting belt" to help with pain.  No urinary or bowel complaints.  No head trauma. No fever.  No difficulty breathing.    Habits & Providers  Alcohol-Tobacco-Diet     Tobacco Status: never  Current Medications (verified): 1)  Bayer Childrens Aspirin 81 Mg Chew (Aspirin) .... Take 1 Tablet Once A Day 2)  Caltrate 600+d Plus 600-400 Mg-Unit Tabs (Calcium Carbonate-Vit D-Min) .... Take 1 Tablet By Mouth Twice A Day 3)  Hydrochlorothiazide 25 Mg Tabs (Hydrochlorothiazide) .... Take One Tablet Daily 4)  Zocor 80 Mg Tabs (Simvastatin) .... Take 1/4 Tablet By Mouth Once A Day 5)  Tylenol 325 Mg Tabs (Acetaminophen) .... Take 2 Tablets 3 X Day. 6)  Tramadol Hcl 50 Mg Tabs (Tramadol Hcl) .... Take 1 Tablet Every 8 Hours As Needed For Pain  Allergies (verified): No Known Drug Allergies  Review of Systems  The patient denies anorexia, fever, chest pain, syncope, dyspnea on exertion, peripheral edema, and hemoptysis.    Physical Exam  General:  VSS Well-developed,well-nourished,in no acute distress; alert,appropriate  and cooperative throughout examination Lungs:  Normal respiratory effort, chest expands symmetrically. Lungs are clear to auscultation, no crackles or wheezes.  Good air movement throughout all lobes bilateral including site of injury. Heart:  Normal rate and regular rhythm. S1 and S2 normal without gallop, murmur, click, rub or other extra sounds. Abdomen:  Bowel sounds positive,abdomen soft and non-tender without masses, organomegaly or hernias noted.  Bruising present along ribs in mid axillary line.  Pain present in this location with deep respiration. Msk:  Pain in left rib area with deep breath. Pulses:  2+ pulses bilateral Extremities:  no edema Psych:  Oriented X3.     Impression & Recommendations:  Problem # 1:  RIB PAIN, LEFT SIDED (ICD-786.50) Assessment New Physical exam indicates most likely patient has rib fracture in left rib mid axillary area.  Pt has good airmovement in all lobes and no SOB which help to rule out pneumothorax 2/2 to rib fracture.  No x-ray indicated since not able to always pick up rib fracture on film.  Pt clinically has a rib fracture and the treatment plan is to use scheduled tylenol paired with tramadol for breakthrough pain.  Pt aware that it may take 4-6 weeks before pain resolves.  Recommended pt take deep breaths and continue to move/exercise as tolerated in order to prevent Pnuemonia.  Pt is to return if any concerns or questions.  Orders: [facility]- Est Level  3 ([address])  Complete Medication List: 1)  Bayer Childrens Aspirin 81 Mg Chew (Aspirin) .... Take 1 tablet once a day 2)  Caltrate 600+d Plus 600-400 Mg-unit Tabs (Calcium carbonate-vit d-min) .... Take 1 tablet by mouth twice a day 3)  Hydrochlorothiazide 25 Mg Tabs (Hydrochlorothiazide) .... Take one tablet daily 4)  Zocor 80 Mg Tabs (Simvastatin) .... Take 1/4 tablet by mouth once a day 5)  Tylenol 325 Mg Tabs (Acetaminophen) .... Take 2 tablets 3 x day. 6)  Tramadol Hcl 50 Mg Tabs (Tramadol hcl)  .... Take 1 tablet every 8 hours as needed for pain  Patient Instructions: 1)  Take tylenol and tramadol as directed. 2)  Return if any worsening of symptoms, shortness of breath, or any lack of pain control.  Prescriptions: TRAMADOL HCL 50 MG TABS (TRAMADOL HCL) take 1 tablet every 8 hours as needed for pain  #40 x 0   Entered and Authorized by:   Ellin Mayhew MD   Signed by:   Ellin Mayhew MD on 08/19/2009   Method used:   Print then Give to Patient   RxID:   [id number]

## 2010-09-03 NOTE — Miscellaneous (Signed)
Summary: Problem review   

## 2010-09-03 NOTE — Progress Notes (Signed)
Summary: NEEDS OV/TS  ---- Converted from flag ---- ---- 05/25/2010 11:25 AM, Zachery Dauer MD wrote: Please call her to schedule a fasting lipid profile and follow-up office visit. Will need before further cholesterol medicine refills. ------------------------------  called pt and lmvm to sched. ov with dr.hale

## 2010-09-03 NOTE — Assessment & Plan Note (Signed)
Summary: LAB AND F/U  [facility]   Vital Signs:  Patient profile:   77 year old female Menstrual status:  hysterectomy Height:      63 inches Weight:      153 pounds BMI:     27.20 Pulse rate:   76 / minute BP sitting:   136 / 79  (left arm) Cuff size:   regular  Vitals Entered By: Tessie Fass CMA (June 11, 2010 8:40 AM) CC: F/U BP Is Patient Diabetic? No Pain Assessment Patient in pain? no        Primary Care Provider:  Zachery Dauer  CC:  F/U BP.  History of Present Illness: She is her for follow-up of her elevated blood sugar on her fasting labs last visit. She denies polydipsia or polyuria. Has been exercising at least 3 days weekly walking 30 minutes on most of those days.   Her uri symptoms have improved though still has a dry cough.   She is now taking 10 mg of lisinopril daily. blood pressure from home range from 117-143/68-87 with more recent blood pressure readings being at the lower end of that range.   No numbness in her feet unless her stockings are too tight.   Allergies: No Known Drug Allergies  Physical Exam  Neck:  No deformities, masses, or tenderness noted. Lungs:  Normal respiratory effort, chest expands symmetrically. Lungs are clear to auscultation, no crackles or wheezes. Heart:  Normal rate and regular rhythm. S1 and S2 normal without gallop, murmur, click, rub or other extra sounds. Extremities:  Very thick callouses under the pressure areas on her feet Neurologic:  sensation intact to light touch of monofilament on great toes. .     Impression & Recommendations:  Problem # 1:  IMPAIRED GLUCOSE TOLERANCE (ICD-271.3) Her A1c indicates that she is not averaging in the diabetic range. Her increased exercise and weight loss may be helping Orders: A1C-[facility] ([address]) [facility]- Est Level  3 (60454)  Problem # 2:  HYPERLIPIDEMIA (ICD-272.4)  Her updated medication list for this problem includes:    Simvastatin 20 Mg Tabs (Simvastatin) .Marland Kitchen... Take one tab  daily  Problem # 3:  HYPERTENSION, BENIGN SYSTEMIC (ICD-401.1) Controlled in increased Enalapril. HCTZ helps her chronic ankle edema. Will check creatinine and K  Her updated medication list for this problem includes:    Hydrochlorothiazide 25 Mg Tabs (Hydrochlorothiazide) .Marland Kitchen... Take one tablet daily    Enalapril Maleate 10 Mg Tabs (Enalapril maleate) .Marland Kitchen... Take one tab daily  Orders: Basic Met-[facility] [phone]) [facility]- Est Level  3 ([address])  Problem # 4:  OVERWEIGHT (ICD-278.02) Assessment: Improved  Complete Medication List: 1)  Bayer Childrens Aspirin 81 Mg Chew (Aspirin) .... Take 1 tablet once a day 2)  Caltrate 600+d Plus 600-400 Mg-unit Tabs (Calcium carbonate-vit d-min) .... Take 1 tablet by mouth twice a day 3)  Hydrochlorothiazide 25 Mg Tabs (Hydrochlorothiazide) .... Take one tablet daily 4)  Simvastatin 20 Mg Tabs (Simvastatin) .... Take one tab daily 5)  Tylenol 325 Mg Tabs (Acetaminophen) .... Take 2 tablets 3 x day. 6)  Enalapril Maleate 10 Mg Tabs (Enalapril maleate) .... Take one tab daily 7)  Zostavax 13086 Unt/0.20ml Solr (Zoster vaccine live) .... Inject x 1  Patient Instructions: 1)  Congratulations on losing weight and exercising regularly 2)  Your A1c is 5.9 which is in the normal range.  3)  Get a pummice file to keep your callouses from getting too hard.  4)  Please schedule a follow-up appointment  in 6 months .    Orders Added: 1)  Basic Met-[facility] [[address]] 2)  A1C-[facility] [83036] 3)  [facility]- Est Level  3 [[address]11]     Prevention & Chronic Care Immunizations   Influenza vaccine: Historical  (05/13/2009)   Influenza vaccine due: 04/26/2009    Tetanus booster: 06/13/2008: given   Tetanus booster due: 06/13/2018    Pneumococcal vaccine: Done.  (07/08/1998)   Pneumococcal vaccine due: None    H. zoster vaccine: Not documented  Colorectal Screening   Hemoccult: Done.  (01/30/2005)   Hemoccult due: Not Indicated    Colonoscopy: Done.  (06/02/2006)    Colonoscopy due: 06/02/2016  Other Screening   Pap smear: Done.  (10/31/1997)   Pap smear due: Not Indicated    Mammogram: ASSESSMENT: Negative - BI-RADS 1^MM DIGITAL SCREENING  (11/07/2009)   Mammogram due: 11/08/2010    DXA bone density scan: Done.  (03/02/2002)   DXA scan due: None    Smoking status: never  (05/28/2010)  Lipids   Total Cholesterol: 152  (05/28/2010)   Lipid panel action/deferral: Lipid Panel ordered   LDL: 74  (05/28/2010)   LDL Direct: Not documented   HDL: 58  (05/28/2010)   Triglycerides: 101  (05/28/2010)   Lipid panel due: 05/13/2010    SGOT (AST): 21  (05/28/2010)   SGPT (ALT): 14  (05/28/2010)   Alkaline phosphatase: 55  (05/28/2010)   Total bilirubin: 0.6  (05/28/2010)   Liver panel due: 05/13/2010    Lipid flowsheet reviewed?: Yes   Progress toward LDL goal: At goal  Hypertension   Last Blood Pressure: 136 / 79  (06/11/2010)   Serum creatinine: 1.01  (05/28/2010)   Serum potassium 4.8  (05/28/2010)    Hypertension flowsheet reviewed?: Yes   Progress toward BP goal: At goal  Self-Management Support :   Personal Goals (by the next clinic visit) :      Personal blood pressure goal: 140/90  (05/13/2009)     Personal LDL goal: 130  (05/13/2009)    Hypertension self-management support: BP self-monitoring log  (05/13/2009)    Lipid self-management support: Education handout  (05/13/2009)   Laboratory Results   Blood Tests   Date/Time Received: June 11, 2010 9:09 AM  Date/Time Reported: June 11, 2010 9:21 AM   HGBA1C: 5.9%   (Normal Range: Non-Diabetic - 3-6%   Control Diabetic - 6-8%)  Comments: ...........test performed by...........Marland KitchenTerese Door, CMA

## 2010-10-02 ENCOUNTER — Other Ambulatory Visit: Payer: Self-pay | Admitting: Family Medicine

## 2010-10-02 DIAGNOSIS — Z1231 Encounter for screening mammogram for malignant neoplasm of breast: Secondary | ICD-10-CM

## 2010-10-12 ENCOUNTER — Encounter: Payer: Self-pay | Admitting: Home Health Services

## 2010-11-10 ENCOUNTER — Ambulatory Visit
Admission: RE | Admit: 2010-11-10 | Discharge: 2010-11-10 | Disposition: A | Discharge status: Home / Self Care | Payer: Medicare Other | Source: Ambulatory Visit | Attending: Family Medicine | Admitting: Family Medicine

## 2010-11-10 DIAGNOSIS — Z1231 Encounter for screening mammogram for malignant neoplasm of breast: Secondary | ICD-10-CM

## 2010-12-10 ENCOUNTER — Encounter: Payer: Self-pay | Admitting: Home Health Services

## 2010-12-10 ENCOUNTER — Ambulatory Visit (INDEPENDENT_AMBULATORY_CARE_PROVIDER_SITE_OTHER): Payer: Medicare Other | Admitting: Home Health Services

## 2010-12-10 VITALS — BP 119/78 | Temp 97.5°F | Ht 64.5 in | Wt 157.8 lb

## 2010-12-10 DIAGNOSIS — Z0000 Encounter for general adult medical examination without abnormal findings: Secondary | ICD-10-CM

## 2010-12-10 DIAGNOSIS — I10 Essential (primary) hypertension: Secondary | ICD-10-CM

## 2010-12-10 NOTE — Progress Notes (Signed)
Patient here for annual wellness visit, patient reports: Risk Factors/Conditions needing evaluation or treatment: Patient has had consistent blood pressure readings in the 90/60 range but has not had any dizziness or weakness.  Scheduled fu appointment with PCP. Home Safety: Patient lives in 1 story home with husband.  Patient reports having smoke detectors and does not have adaptive equipment in bathroom. Other Information: Corrective lens: Patient uses corrective lens for reading.  Had glaucoma in both eyes but received lasik surgery. Dentures: Patient does not have dentures. Memory: Patient denies any memory problems.  Patient's Mini Mental Score (recorded in doc. flowsheet): 30  Balance/Gait: Patient does not have any balance/gait impairments Balance Abnormal Patient value  Sitting balance    Arise    Attempts to arise    Immediate standing balance    Standing balance    Nudge    Eyes closed    360 degree turn    Sitting down     Gait Abnormal Patient value  Initiation of gait    Step length-left    Step length-right    Step height-left    Step height-right    Step symmetry    Step continuity    Path    Trunk    Walking stance        Annual Wellness Visit Requirements Recorded Today In  Medical, family, social history Past Medical, Family, Social History Section  Current providers Care team  Current medications Medications  Wt, BP, Ht, BMI Vital signs  Hearing assessment (welcome visit) declined  Tobacco, alcohol, illicit drug use History  ADL Nurse Assessment  Depression Screening Nurse Assessment  Cognitive impairment Nurse Assessment  Mini Mental Status Document Flowsheet  Fall Risk Nurse Assessment  Home Safety Progress Note  End of Life Planning (welcome visit) Social Documentation  Medicare preventative services Progress Note  Risk factors/conditions needing evaluation/treatment Progress Note  Personalized health advice Patient Instructions, goals,  letter  Diet & Exercise Social Documentation  Emergency Contact Social Documentation  Seat Belts Social Documentation  Sun exposure/protection Social Documentation    Prevention Plan: Patient is up to date with all recommended screenings.   Recommended Medicare Prevention Screenings Women over 81 Test For Frequency Date of Last- BOLD if needed  Breast Cancer 1-2 yrs 4/11  Cervical Cancer 1-3 yrs 4/99  Colorectal Cancer 1-10 yrs 11/07  Osteoporosis once 8/03  Cholesterol 5 yrs 10/11  Diabetes yearly 11/11  HIV yearly declined  Influenza Shot yearly 10/11  Pneumonia Shot once 12/99  Zostavax Shot once 12/11   .I have reviewed this visit and discussed with Arlys John and agree with her documentation.

## 2010-12-10 NOTE — Patient Instructions (Signed)
1. Focus on eating 3-4 vegetables a day. 2. Continue walking 3-4 times a week for at least 30 minutes. 3. Continue to take your blood pressure daily and keep a record. 4. Complete Living Will and bring a copy to Dr. Sheffield Slider.

## 2010-12-11 ENCOUNTER — Other Ambulatory Visit: Payer: Medicare Other

## 2010-12-11 DIAGNOSIS — I10 Essential (primary) hypertension: Secondary | ICD-10-CM

## 2010-12-11 LAB — BASIC METABOLIC PANEL
Chloride: 101 mEq/L (ref 96–112)
Creat: 0.98 mg/dL (ref 0.40–1.20)
Potassium: 5 mEq/L (ref 3.5–5.3)
Sodium: 141 mEq/L (ref 135–145)

## 2010-12-11 NOTE — Progress Notes (Signed)
Bmp done today [place] 

## 2010-12-15 ENCOUNTER — Ambulatory Visit (INDEPENDENT_AMBULATORY_CARE_PROVIDER_SITE_OTHER): Payer: Medicare Other | Admitting: Family Medicine

## 2010-12-15 ENCOUNTER — Encounter: Payer: Self-pay | Admitting: Family Medicine

## 2010-12-15 VITALS — BP 133/84 | HR 69 | Temp 97.5°F | Ht 64.5 in | Wt 154.0 lb

## 2010-12-15 DIAGNOSIS — M7061 Trochanteric bursitis, right hip: Secondary | ICD-10-CM

## 2010-12-15 DIAGNOSIS — M76899 Other specified enthesopathies of unspecified lower limb, excluding foot: Secondary | ICD-10-CM

## 2010-12-15 DIAGNOSIS — M5430 Sciatica, unspecified side: Secondary | ICD-10-CM

## 2010-12-15 DIAGNOSIS — I10 Essential (primary) hypertension: Secondary | ICD-10-CM

## 2010-12-15 DIAGNOSIS — E739 Lactose intolerance, unspecified: Secondary | ICD-10-CM

## 2010-12-15 NOTE — Assessment & Plan Note (Signed)
Now adequately controlled. Recommended that she move the HCTZ to AM dosing when her edema is least for better diuresis

## 2010-12-15 NOTE — Progress Notes (Signed)
  Subjective:    Patient ID: Amanda Shaw, female    DOB: 01/28/1930, 77 y.o.   MRN: [id number]  HPI is here for followup of her blood pressure measurements have been generally below 120/80 when checked at home Is always higher in the offic, she's had no effects ill effects from the enalapril 10 mg daily that  was added to the hydrochlorothiazide 25 mg daily except that she now notes dry mouth  She recalls having a bone mineral density test done which had a mammogram in August of 2009  For couple years she has had pain over the right lateral hip this particular bothers her when she lies on that side which first gets up after sitting   She is followup of her of the superficial vein injections in February and no further treatment was planned she does get trace ankle edema  She is occasional mild urge incontinence. She's been taking her hydrochlorothiazide at nighttime because that's when she notes most ankle swelling    Review of Systems     Objective:   Physical Exam well-appearing in good spirit Thyroid normal Chest clear Heart regular rhythm without murmur Abdomen soft without masses or tenderness no surgical scars Trace ankle edema Local tenderness over the right greater trochanter with a diameter of about 4 cm Straight leg raising is completely normal bilateral       Assessment & Plan:

## 2010-12-15 NOTE — Assessment & Plan Note (Signed)
Still at lower end of range with fasting glucose of 102

## 2010-12-15 NOTE — Patient Instructions (Signed)
Return in 6 months for blood pressure check and blood test.  Recheck sooner if the right hip worsens again.

## 2010-12-15 NOTE — Assessment & Plan Note (Signed)
After explaining the procedure and cleansing with Betadine and Alcohol. The tender area was marked, Ethyl Chloride spray for cryoanesthesia, the bursa was injected with 2 cc Triamcinolone 10 mg and 3 cc Marcaine via injection fanning from the central point

## 2010-12-15 NOTE — Assessment & Plan Note (Addendum)
Unchanged on last labs after starting Enalapril. Her calcium was reduced to one tab daily due to Calcium of 11.1. It is now back in the normal range. May be able to cut HCTZ dose in half later, especially if she loses some weight.

## 2010-12-15 NOTE — Assessment & Plan Note (Signed)
No recent sx  

## 2010-12-17 ENCOUNTER — Telehealth: Payer: Self-pay | Admitting: *Deleted

## 2010-12-17 NOTE — Telephone Encounter (Signed)
Called [facility]. They will fax result of Bone Density Test (12-05-09)  to Dr.Hale .Arlyss Repress

## 2010-12-18 NOTE — Op Note (Signed)
NAME:  Amanda Shaw, Amanda Shaw                    ACCOUNT NO.:  [id number]   MEDICAL RECORD NO.:  [id number]                   PATIENT TYPE:  AMB   LOCATION:  [facility]                                 FACILITY:  [facility]   PHYSICIAN:  Mark C. Vernie Ammons, M.D.               DATE OF BIRTH:  05/12/1930   DATE OF PROCEDURE:  02/14/2004  DATE OF DISCHARGE:                                 OPERATIVE REPORT   PREOPERATIVE DIAGNOSES:  1. Cystocele.  2. Stress urinary incontinence.   POSTOPERATIVE DIAGNOSES:  1. Cystocele.  2. Stress urinary incontinence.   PROCEDURE:  1. Anterior repair.  2. Obturator tape sling.   SURGEON:  Mark C. Vernie Ammons, M.D.   ANESTHESIA:  General LMA.   ESTIMATED BLOOD LOSS:  Approximately 75 mL.   DRAINS:  None.   SPECIMENS:  None.   COMPLICATIONS:  None.   INDICATIONS:  The patient is a 77 year old white female who was found to  have a significant cystocele, which was present for a number of years.  She  was found at the time of urodynamic study to have a normal to low bladder  capacity but nonobstructed voiding pattern and significant stress  incontinence with elevation of her cystocele.  She is therefore brought to  the OR today for surgical correction, and I have discussed the risks,  complications, alternatives, and limitations with her.   DESCRIPTION OF OPERATION:  After informed consent, patient brought to the  major OR and placed on the table and administered general anesthesia, then  moved to the dorsal lithotomy position.  Her genitalia, vagina, and perineal  regions were sterilely prepped and draped.  A 16 French Foley catheter was  then placed in the bladder and 1% lidocaine with epinephrine was used to  infiltrate the subvaginal mucosa in the midline over a large cystocele.  After allowing adequate time for epinephrine effect, a midline incision was  then made from just inside the introitus at the 12 o'clock position back to  the vaginal cuff.  Using a  combination of blunt and sharp technique, the  mucosa was then dissected off of the bulging bladder back to the  pubocervical ligament on each side.  Vicryl 2-0 suture was then used in an  interrupted fashion to reapproximate the pubocervical ligament in the  midline, obliterating the cystocele completely.  The redundant vaginal  mucosa was then excised.   The obturator fossa was palpated on each side lateral and just superior to  the level of the clitoris.  Stab incisions were made at this location at its  medial border.  The bladder was then fully drained with a Foley catheter and  the sling trocar was passed through the skin incision through the obturator  fossa, behind the bone, and out at the midlevel of the urethra, noted by  palpation of the Foley catheter balloon at the bladder neck.  This was  performed on the left side, then  right side.  The ob-tape sling material was  placed in the eyes of the trocar and brought out through the skin incisions.   The Foley catheter was removed, a 21 French cystoscope was inserted in the  bladder with 70 degree lens.  The patient received an amp of indigo carmine,  and blue efflux was noted from both ureteral orifices.  I then fully  inspected the bladder and noted it to be free of any tumors or stones.  Some  areas of cystitis follicularis were noted.  No evidence of injury to the  bladder, perforation, or foreign bodies were noted.  The urethra was also  noted to be intact.  The cystoscope was therefore removed and the Foley  catheter replaced, the ob-tape sling adjusted so that no tension was  present, and the excess material was cut at the skin level.  The skin  incisions on either side were then closed with Dermabond.  I closed the  vaginal incision with a running 2-0 Vicryl suture.  Two-inch iodoform gauze  with Neosporin was then used as a vaginal packing and the Foley catheter was  used to drain the bladder, after which the patient was  awakened and taken to  the recovery room in stable and satisfactory condition.  She tolerated the  procedure well with no intraoperative complications, with needle, sponge,  and instrument counts correct at the end of the operation.                                               Mark C. Vernie Ammons, M.D.    MCO/MEDQ  D:  02/14/2004  T:  02/14/2004  Job:  [id number]

## 2011-01-29 ENCOUNTER — Ambulatory Visit (INDEPENDENT_AMBULATORY_CARE_PROVIDER_SITE_OTHER): Payer: Medicare Other

## 2011-01-29 ENCOUNTER — Ambulatory Visit (INDEPENDENT_AMBULATORY_CARE_PROVIDER_SITE_OTHER): Payer: Medicare Other | Admitting: Family Medicine

## 2011-01-29 ENCOUNTER — Encounter: Payer: Self-pay | Admitting: Family Medicine

## 2011-01-29 VITALS — BP 126/78 | Temp 97.5°F | Ht 64.0 in | Wt 152.0 lb

## 2011-01-29 DIAGNOSIS — M169 Osteoarthritis of hip, unspecified: Secondary | ICD-10-CM

## 2011-01-29 MED ORDER — TRAMADOL HCL 50 MG PO TABS: 50.0000 mg | ORAL_TABLET | Freq: Two times a day (BID) | ORAL | Status: AC

## 2011-02-01 NOTE — Progress Notes (Signed)
  Subjective:    Patient ID: Amanda Shaw, female    DOB: 02-10-1930, 77 y.o.   MRN: [id number]  HPI Chronic right hip pain.  Worsening.  Relates onset to a time several years ago when felt a popping of hip during a pelvic exam.  Did not recall any X rays.  Reviewing, had Rt hip films done 2008 which showed osteoarthritis.  Also has chronic back pain that may be contributing.  Seen recently by Sheffield Slider who did injection for greater trochanteric bursitis without much benefit.    Review of Systems     Objective:   Physical Exam Right hip pain on internal and rotation of Rt hip.  Some paraspinous muscle spasm of lumbar spine.  Mild diffuse tenderness over greater trochanter.       Assessment & Plan:

## 2011-02-01 NOTE — Assessment & Plan Note (Signed)
I feel most of pain likely due to osteoarthritis of hip.  Could also have component of trochanteric bursitis and/or lumbar radiculopathy.  Given my approach to treat as a chronic pain syndrome, the differentiation is less important.  Scheduled tramadol.  She was not taking scheduled tylenol.

## 2011-02-05 ENCOUNTER — Telehealth: Payer: Self-pay | Admitting: Family Medicine

## 2011-02-05 MED ORDER — IBUPROFEN 600 MG PO TABS: 600.0000 mg | ORAL_TABLET | Freq: Four times a day (QID) | ORAL | Status: AC | PRN

## 2011-02-05 NOTE — Telephone Encounter (Signed)
Called husband for his cholesterol. Amanda Shaw continues weight bearing hip pain. She will try a 5 day course of Ibuprofen and call me for orthopedics referral if not improved

## 2011-03-16 ENCOUNTER — Ambulatory Visit (INDEPENDENT_AMBULATORY_CARE_PROVIDER_SITE_OTHER): Payer: Medicare Other | Admitting: Family Medicine

## 2011-03-16 ENCOUNTER — Encounter: Payer: Self-pay | Admitting: Family Medicine

## 2011-03-16 VITALS — BP 128/78 | HR 73 | Ht 64.0 in | Wt 156.0 lb

## 2011-03-16 DIAGNOSIS — E785 Hyperlipidemia, unspecified: Secondary | ICD-10-CM

## 2011-03-16 DIAGNOSIS — I10 Essential (primary) hypertension: Secondary | ICD-10-CM

## 2011-03-16 DIAGNOSIS — M1610 Unilateral primary osteoarthritis, unspecified hip: Secondary | ICD-10-CM

## 2011-03-16 DIAGNOSIS — M169 Osteoarthritis of hip, unspecified: Secondary | ICD-10-CM

## 2011-03-16 MED ORDER — HYDROCHLOROTHIAZIDE 25 MG PO TABS: 12.5000 mg | ORAL_TABLET | Freq: Every day | ORAL | Status: DC

## 2011-03-16 NOTE — Assessment & Plan Note (Signed)
Schedule lipid profile before her next visit

## 2011-03-16 NOTE — Assessment & Plan Note (Addendum)
well controlled pressure is on the low side of normal so we will decrease her hydrochlorothiazide to half of a 25 mg tablet daily. BP's at home 97-113/74-80

## 2011-03-16 NOTE — Progress Notes (Signed)
  Subjective:    Patient ID: Amanda Shaw, female    DOB: [birth year], 77 y.o.   MRN: [id number]  HPI she is here for followup of her right hip pain. This did not improve with the trochanteric bursa injection, nor with the subsequent course of ibuprofen and tramadol prescribed by Dr. Leveda Anna. She stopped the tramadol after a few days because it made her stool yellow but did not cause constipation or diarrhea. She also took the ibuprofen for only a short course. She dates this right hip pains since so 2008 when she felt a pop and she was abducting her right leg for pelvic examination. she felt that the practitioner pushed the leg sideways which cause the injury. She questions whether it could relate to a bladder 6 sling procedure she had in years past. She's not noted any increase in urinary incontinence. Her husband does complain that the hip pain is interfering with their sexual activity. She indicates the pain is sometimes lateral over the trochanter area but more often in the right groin. It is interfering with her walking.    Review of Systems  Cardiovascular: Negative for chest pain.  Musculoskeletal: Positive for gait problem. Negative for back pain.       Objective:   Physical Exam  Cardiovascular: Normal rate and regular rhythm.   Murmur heard. Pulmonary/Chest: Effort normal and breath sounds normal.  Musculoskeletal:       Well-healed surgical scar over the lumbar spine. She has some low back discomfort with hyperextension but no radiation down to the legs. Straight leg raising does not cause discomfort but she had pain with hip abduction and external rotation. Mild decrease in internal rotation range of motion. Mildly tender over the greater trochanter.           Assessment & Plan:

## 2011-03-16 NOTE — Patient Instructions (Addendum)
We will contact you with the orthopedic consult for your right hip pain  Decrease your HCTZ to 1/2 tablet daily.  Recheck blood pressure with Dr Sheffield Slider in three months. (printed as one month, but will call her to change it.)  Lipid profile and CMET a week before that visit

## 2011-03-18 ENCOUNTER — Telehealth: Payer: Self-pay | Admitting: *Deleted

## 2011-03-18 NOTE — Telephone Encounter (Signed)
Message copied by Arlyss Repress on Thu Mar 18, 2011 12:01 PM ------      Message from: Zachery Dauer      Created: Tue Mar 16, 2011  8:24 PM      Regarding: followup appt       When you call her for the orthopedic referral, ask her to change her follow-up blood pressure visit to 3 months from now with a lab visit for lipid profile and CMET a week before that visit.

## 2011-03-18 NOTE — Telephone Encounter (Signed)
Waiting for pt to call back. Please tell pt: Scheduled appt with Dr.Kendall (ph [phone]) for 03-25-11 at 8:45 am. [address] .Arlyss Repress

## 2011-03-18 NOTE — Telephone Encounter (Signed)
Waiting for call back. (see previous message with appt info with dr.kendall) .Amanda Shaw

## 2011-03-19 NOTE — Telephone Encounter (Signed)
Called pt again with appt info and Dr.Hale's message. Waiting for call back to confirm .Amanda Shaw

## 2011-05-26 ENCOUNTER — Other Ambulatory Visit: Payer: Self-pay | Admitting: Family Medicine

## 2011-05-26 NOTE — Telephone Encounter (Signed)
Refill request

## 2011-06-03 ENCOUNTER — Ambulatory Visit (INDEPENDENT_AMBULATORY_CARE_PROVIDER_SITE_OTHER): Payer: Medicare Other | Admitting: Family Medicine

## 2011-06-03 VITALS — BP 148/84 | HR 76 | Ht 64.0 in | Wt 152.5 lb

## 2011-06-03 DIAGNOSIS — E785 Hyperlipidemia, unspecified: Secondary | ICD-10-CM

## 2011-06-03 DIAGNOSIS — L089 Local infection of the skin and subcutaneous tissue, unspecified: Secondary | ICD-10-CM

## 2011-06-03 DIAGNOSIS — Z87898 Personal history of other specified conditions: Secondary | ICD-10-CM

## 2011-06-03 DIAGNOSIS — K1120 Sialoadenitis, unspecified: Secondary | ICD-10-CM

## 2011-06-03 DIAGNOSIS — M7061 Trochanteric bursitis, right hip: Secondary | ICD-10-CM

## 2011-06-03 DIAGNOSIS — R197 Diarrhea, unspecified: Secondary | ICD-10-CM

## 2011-06-03 DIAGNOSIS — I10 Essential (primary) hypertension: Secondary | ICD-10-CM

## 2011-06-03 DIAGNOSIS — M76899 Other specified enthesopathies of unspecified lower limb, excluding foot: Secondary | ICD-10-CM

## 2011-06-03 DIAGNOSIS — A499 Bacterial infection, unspecified: Secondary | ICD-10-CM

## 2011-06-03 DIAGNOSIS — H81399 Other peripheral vertigo, unspecified ear: Secondary | ICD-10-CM

## 2011-06-03 MED ORDER — SULFAMETHOXAZOLE-TRIMETHOPRIM 800-160 MG PO TABS: 1.0000 | ORAL_TABLET | Freq: Two times a day (BID) | ORAL | Status: AC

## 2011-06-03 NOTE — Patient Instructions (Signed)
Could take husband's Meclizine, but will dry mouth. Do exercises as tolerated. Call if not better in one week.   Take the antibiotic for one week. If the sore gland doesn't get better or comes back I will refer you to an ENT.

## 2011-06-03 NOTE — Assessment & Plan Note (Signed)
She is to call if this worsens on the antibiotic or doesn't improve

## 2011-06-03 NOTE — Assessment & Plan Note (Signed)
Awaiting a letter from Dr. Penni Bombard who had done an MRI of the hip

## 2011-06-03 NOTE — Assessment & Plan Note (Signed)
This is about prescribed it would not seem to predispose to this but she does report dry mouth. She will take the trimethoprim sulfa and suck on lemon drops and drink lots of fluids ENT referral if this fails to improve

## 2011-06-03 NOTE — Assessment & Plan Note (Addendum)
Apparent peripheral vertigo do to latency and improvement with time. She was given exercises at home to attempt to reposition labyrinthine crystals. Her husband had responded to Epley's maneuver he had vertigo this past year Could take husband's Meclizine, but will dry mouth. Do exercises as tolerated. Call if not better in one week.

## 2011-06-03 NOTE — Progress Notes (Signed)
  Subjective:    Patient ID: Amanda Shaw, female    DOB: 04-Sep-1929, 77 y.o.   MRN: [id number]  HPI her primary concern is vertigo which started about a week ago. This can come when she is lying down and changes her head position. Her left external ear feels tender. No pain inside the ear or changes in hearing. No pressure feeling in the ear.  Also for about a week she has had pain at the right angle of her jaw. Does not note a bad taste in her mouth. Is not taking drying of over-the-counter medications. He wonders if her blood pressure medicines are contributing to her dry mouth.   She's had loose bowel movements intermittently for the past 2 weeks. No ill contacts. No recent antibiotics. she had a formed bowel movement this morning.  She has continued right hip pain spite having received 3 injections from Dr. Penni Bombard. 2 shots were in the trochanteric area and one in the inguinal area. She still cannot sleep on her right side  3 days ago she got a splinter out of her right thumb, but it remains painful and she would like it opened to see if there is more splinter remaining    Review of Systems     Objective:   Physical ExamShe is well-nourished and in no acute distress Chest is clear heart regular rhythm Ears both tympanic membranes are normal. Pinnae are normal in both sides She is tender and slightly swollen at the right mandibular angle.  Mouth there is a small amount of pus coming from Wharton's duct on the Right. No stone was physical nor palpable. Dix-Hallpike maneuver was positive for rotary nystagmus with head turned to the left, but not to the right. I cannot do Epley's maneuver due to her right hip discomfort. Neurologic cranial nerves worse symmetric and normal Gait is steady. Finger to nose testing normal. Sharpened Romberg was normal. Extremities right thumb distal tuft had 2 punctate darkened areas in the skin. The more distal was tender. This was anesthetized with  ethyl chloride, and opened with a 23-gauge needle. A small amount of pus was expressed but no foreign body material was apparent. Dressed with antibiotic and Band-Aid      Assessment & Plan:

## 2011-06-03 NOTE — Assessment & Plan Note (Signed)
Check if it persists and will begin for possible foreign body

## 2011-06-04 ENCOUNTER — Other Ambulatory Visit: Payer: Medicare Other

## 2011-06-04 DIAGNOSIS — K1120 Sialoadenitis, unspecified: Secondary | ICD-10-CM

## 2011-06-04 DIAGNOSIS — E785 Hyperlipidemia, unspecified: Secondary | ICD-10-CM

## 2011-06-04 DIAGNOSIS — I10 Essential (primary) hypertension: Secondary | ICD-10-CM

## 2011-06-04 LAB — COMPREHENSIVE METABOLIC PANEL
AST: 22 U/L (ref 0–37)
BUN: 18 mg/dL (ref 6–23)
Chloride: 102 mEq/L (ref 96–112)
Glucose, Bld: 90 mg/dL (ref 70–99)
Potassium: 4.6 mEq/L (ref 3.5–5.3)
Sodium: 141 mEq/L (ref 135–145)

## 2011-06-04 LAB — LIPID PANEL
Cholesterol: 154 mg/dL (ref 0–200)
LDL Cholesterol: 66 mg/dL (ref 0–99)
Total CHOL/HDL Ratio: 2 Ratio
Triglycerides: 52 mg/dL (ref <150)

## 2011-06-04 LAB — CBC
Hemoglobin: 15.6 g/dL — ABNORMAL HIGH (ref 12.0–15.0)
MCH: 32 pg (ref 26.0–34.0)
MCV: 94.9 fL (ref 78.0–100.0)
WBC: 7.7 10*3/uL (ref 4.0–10.5)

## 2011-06-04 NOTE — Progress Notes (Signed)
CMP,CBC AND FLP DONE TODAY Amanda Shaw 

## 2011-06-06 ENCOUNTER — Encounter: Payer: Self-pay | Admitting: Family Medicine

## 2011-06-28 ENCOUNTER — Other Ambulatory Visit: Payer: Self-pay | Admitting: Family Medicine

## 2011-06-28 DIAGNOSIS — E785 Hyperlipidemia, unspecified: Secondary | ICD-10-CM

## 2011-06-28 NOTE — Telephone Encounter (Signed)
Refill request

## 2011-09-07 ENCOUNTER — Other Ambulatory Visit: Payer: Self-pay | Admitting: Orthopedic Surgery

## 2011-09-14 ENCOUNTER — Other Ambulatory Visit: Payer: Self-pay

## 2011-09-14 ENCOUNTER — Encounter (HOSPITAL_COMMUNITY): Payer: Self-pay | Admitting: Pharmacy Technician

## 2011-09-14 ENCOUNTER — Ambulatory Visit (HOSPITAL_COMMUNITY)
Admission: RE | Admit: 2011-09-14 | Discharge: 2011-09-14 | Disposition: A | Discharge status: Home / Self Care | Payer: Medicare Other | Source: Ambulatory Visit | Attending: Orthopedic Surgery | Admitting: Orthopedic Surgery

## 2011-09-14 ENCOUNTER — Encounter (HOSPITAL_COMMUNITY)
Admission: RE | Admit: 2011-09-14 | Discharge: 2011-09-14 | Disposition: A | Discharge status: Home / Self Care | Payer: Medicare Other | Source: Ambulatory Visit | Attending: Orthopedic Surgery | Admitting: Orthopedic Surgery

## 2011-09-14 ENCOUNTER — Encounter (HOSPITAL_COMMUNITY): Payer: Self-pay

## 2011-09-14 DIAGNOSIS — I10 Essential (primary) hypertension: Secondary | ICD-10-CM | POA: Insufficient documentation

## 2011-09-14 DIAGNOSIS — M479 Spondylosis, unspecified: Secondary | ICD-10-CM | POA: Insufficient documentation

## 2011-09-14 DIAGNOSIS — Z01812 Encounter for preprocedural laboratory examination: Secondary | ICD-10-CM | POA: Insufficient documentation

## 2011-09-14 DIAGNOSIS — M76899 Other specified enthesopathies of unspecified lower limb, excluding foot: Secondary | ICD-10-CM | POA: Insufficient documentation

## 2011-09-14 DIAGNOSIS — I77810 Thoracic aortic ectasia: Secondary | ICD-10-CM | POA: Insufficient documentation

## 2011-09-14 DIAGNOSIS — Z01810 Encounter for preprocedural cardiovascular examination: Secondary | ICD-10-CM | POA: Insufficient documentation

## 2011-09-14 DIAGNOSIS — Z9089 Acquired absence of other organs: Secondary | ICD-10-CM | POA: Insufficient documentation

## 2011-09-14 HISTORY — DX: Essential (primary) hypertension: I10

## 2011-09-14 HISTORY — DX: Unspecified osteoarthritis, unspecified site: M19.90

## 2011-09-14 LAB — BASIC METABOLIC PANEL
BUN: 20 mg/dL (ref 6–23)
Calcium: 10.1 mg/dL (ref 8.4–10.5)
Creatinine, Ser: 0.81 mg/dL (ref 0.50–1.10)
GFR calc Af Amer: 78 mL/min — ABNORMAL LOW (ref >90)
GFR calc non Af Amer: 67 mL/min — ABNORMAL LOW (ref >90)

## 2011-09-14 LAB — CBC
HCT: 43.5 % (ref 36.0–46.0)
MCH: 31.9 pg (ref 26.0–34.0)
MCHC: 34 g/dL (ref 30.0–36.0)
MCV: 93.8 fL (ref 78.0–100.0)
Platelets: 250 10*3/uL (ref 150–400)
RDW: 11.9 % (ref 11.5–15.5)
WBC: 7.3 10*3/uL (ref 4.0–10.5)

## 2011-09-14 NOTE — Patient Instructions (Signed)
20 KACY HEGNA  09/14/2011   Your procedure is scheduled on:  09/22/11 230pm-330pm  Report to [facility] at 1230pm.  Call this number if you have problems the morning of surgery: [phone]   Remember:   Do not eat food:After Midnight.  May have clear liquids:until 0800am then npo .  Clear liquids include soda, tea, black coffee, apple or grape juice, broth.  Take these medicines the morning of surgery with A SIP OF WATER:    Do not wear jewelry, make-up or nail polish.  Do not wear lotions, powders, or perfumes.   Do not shave 48 hours prior to surgery.  Do not bring valuables to the hospital.  Contacts, dentures or bridgework may not be worn into surgery.  Leave suitcase in the car. After surgery it may be brought to your room.  For patients admitted to the hospital, checkout time is 11:00 AM the day of discharge.    Special Instructions: CHG Shower Use Special Wash: 1/2 bottle night before surgery and 1/2 bottle morning of surgery. shower chin to toes with CHG.  Wash face and private parts with regular soap.     Please read over the following fact sheets that you were given: MRSA Information, Incentive Spirometry Fact sheet, coughing and deep breathing exercises, leg exercises

## 2011-09-15 NOTE — Pre-Procedure Instructions (Signed)
09/15/11 Pt called at home and instructed not to take any am meds morning of surgery.  Pt voiced understanding.

## 2011-09-15 NOTE — Pre-Procedure Instructions (Signed)
09/15/11 EKG of 09/14/11 shown to Dr Rica Mast along with EKG from 2005.  Dr Rica Mast reported no changes in ekg.

## 2011-09-17 ENCOUNTER — Other Ambulatory Visit: Payer: Self-pay | Admitting: Orthopedic Surgery

## 2011-09-17 MED ORDER — SODIUM CHLORIDE 0.9 % IV SOLN: INTRAVENOUS | Status: DC

## 2011-09-17 MED ORDER — BUPIVACAINE LIPOSOME 1.3 % IJ SUSP: 20.0000 mL | Freq: Once | INTRAMUSCULAR | Status: DC

## 2011-09-17 MED ORDER — CHLORHEXIDINE GLUCONATE 4 % EX LIQD: 60.0000 mL | Freq: Once | CUTANEOUS | Status: DC

## 2011-09-21 NOTE — H&P (Signed)
  CC- Amanda Shaw is a 78 y.o. female who presents with right hip pain  Hip Pain: Patient complains of right hip pain. Onset of the symptoms was several months ago. Inciting event: none. Current symptoms include is aggravated by walking and is worse with lying on right side. Associated symptoms: none. Aggravating symptoms: pivoting, rising after sitting, squatting and walking. Patient's overall course: gradually worsening. Patient has had no prior hip problems.  Evaluation to date: plain films, which were abnormal  mild OA. MRI with gluteal tendon tear.  Treatment to date: injections with partial temporary relief.  Past Medical History  Diagnosis Date  . Fracture of ankle, left, closed 10/1998  . Gastritis 07/2006    by endoscopy  . Hypertension   . Arthritis     arthritis in hips     Past Surgical History  Procedure Date  . Tonsillectomy 1956  . Cholecystectomy 1987  . Trabeculectomy     laser  . Vaginal hysterectomy 1988    benign disease  . Lumbar disc surgery [phone]  . Bladder suspension 01/2004  . Cryoablation     bridge of nose, Dr Yetta Barre  . Spider vein injection 2010    Prior to Admission medications   Medication Sig Start Date End Date Taking? Authorizing Provider  acetaminophen (TYLENOL) 325 MG tablet Take 650 mg by mouth every 6 (six) hours as needed. Pain     Historical Provider, MD  aspirin (BAYER CHILDRENS ASPIRIN) 81 MG chewable tablet Chew 81 mg by mouth every evening.     Historical Provider, MD  Calcium Carbonate-Vitamin D (CALTRATE 600+D) 600-400 MG-UNIT per tablet Take 1 tablet by mouth daily.     Historical Provider, MD  enalapril (VASOTEC) 10 MG tablet  05/26/11   Tobin Chad, MD  hydrochlorothiazide (HYDRODIURIL) 25 MG tablet Take 12.5 mg by mouth daily with breakfast. 03/16/11   Tobin Chad, MD  ibuprofen (ADVIL,MOTRIN) 600 MG tablet Take 600 mg by mouth every 6 (six) hours as needed. PAIN  02/05/11   Historical Provider, MD    Loteprednol-Tobramycin (ZYLET) 0.5-0.3 % SUSP Place 1 drop into the left eye 3 (three) times daily. For infection in left eye started eye drop on 09/07/11 finish on eye drops prior to surgery    Historical Provider, MD  simvastatin (ZOCOR) 20 MG tablet  06/28/11   Tobin Chad, MD    Physical Examination: General appearance - alert, well appearing, and in no distress Mental status - alert, oriented to person, place, and time Chest - clear to auscultation, no wheezes, rales or rhonchi, symmetric air entry Heart - normal rate, regular rhythm, normal S1, S2, no murmurs, rubs, clicks or gallops Abdomen - soft, nontender, nondistended, no masses or organomegaly Neurological - alert, oriented, normal speech, no focal findings or movement disorder noted  A right hip exam was performed. GENERAL: no acute distress TENDERNESS: moderate and maximal at greater trochanter ROM: normal STRENGTH: 4 of 5 hip abduction GAIT: antalgic  ASSESSMENT: Right hip pain  Plan- Right  hip bursectomy with gluteal tendon repair. Discussed procedure risks and potential comps with patient who elects to proceed. Goals are decreased pain and functional improvement; both of which should be acheived  Gus Rankin. Ornella Coderre, MD    09/21/2011, 10:01 PM

## 2011-09-22 ENCOUNTER — Encounter (HOSPITAL_COMMUNITY): Payer: Self-pay | Admitting: *Deleted

## 2011-09-22 ENCOUNTER — Encounter (HOSPITAL_COMMUNITY)
Admission: RE | Disposition: A | Discharge status: Home / Self Care | Payer: Self-pay | Source: Ambulatory Visit | Attending: Orthopedic Surgery

## 2011-09-22 ENCOUNTER — Ambulatory Visit (HOSPITAL_COMMUNITY): Payer: Medicare Other | Admitting: Anesthesiology

## 2011-09-22 ENCOUNTER — Ambulatory Visit (HOSPITAL_COMMUNITY)
Admission: RE | Admit: 2011-09-22 | Discharge: 2011-09-23 | Disposition: A | Discharge status: Home / Self Care | Payer: Medicare Other | Source: Ambulatory Visit | Attending: Orthopedic Surgery | Admitting: Orthopedic Surgery

## 2011-09-22 ENCOUNTER — Encounter (HOSPITAL_COMMUNITY): Payer: Self-pay | Admitting: Anesthesiology

## 2011-09-22 DIAGNOSIS — I10 Essential (primary) hypertension: Secondary | ICD-10-CM | POA: Insufficient documentation

## 2011-09-22 DIAGNOSIS — Z79899 Other long term (current) drug therapy: Secondary | ICD-10-CM | POA: Insufficient documentation

## 2011-09-22 DIAGNOSIS — IMO0002 Reserved for concepts with insufficient information to code with codable children: Secondary | ICD-10-CM | POA: Insufficient documentation

## 2011-09-22 DIAGNOSIS — Z7982 Long term (current) use of aspirin: Secondary | ICD-10-CM | POA: Insufficient documentation

## 2011-09-22 DIAGNOSIS — X58XXXA Exposure to other specified factors, initial encounter: Secondary | ICD-10-CM | POA: Insufficient documentation

## 2011-09-22 DIAGNOSIS — M76899 Other specified enthesopathies of unspecified lower limb, excluding foot: Secondary | ICD-10-CM | POA: Insufficient documentation

## 2011-09-22 DIAGNOSIS — M25559 Pain in unspecified hip: Secondary | ICD-10-CM | POA: Insufficient documentation

## 2011-09-22 DIAGNOSIS — M7071 Other bursitis of hip, right hip: Secondary | ICD-10-CM | POA: Diagnosis present

## 2011-09-22 HISTORY — PX: EXCISION/RELEASE BURSA HIP: SHX5014

## 2011-09-22 SURGERY — RELEASE, BURSA, TROCHANTERIC
Anesthesia: General | Site: Hip | Laterality: Right | Wound class: Clean

## 2011-09-22 MED ORDER — MORPHINE SULFATE 2 MG/ML IJ SOLN
1.0000 mg | INTRAMUSCULAR | Status: DC | PRN
  Administered 2011-09-22 – 2011-09-23 (×3): 2 mg via INTRAVENOUS
  Filled 2011-09-22 (×3): qty 1

## 2011-09-22 MED ORDER — FENTANYL CITRATE 0.05 MG/ML IJ SOLN
INTRAMUSCULAR | Status: DC | PRN
  Administered 2011-09-22 (×3): 50 ug via INTRAVENOUS
  Administered 2011-09-22: 100 ug via INTRAVENOUS

## 2011-09-22 MED ORDER — LIDOCAINE HCL (CARDIAC) 20 MG/ML IV SOLN
INTRAVENOUS | Status: DC | PRN
  Administered 2011-09-22: 80 mg via INTRAVENOUS

## 2011-09-22 MED ORDER — ACETAMINOPHEN 10 MG/ML IV SOLN
1000.0000 mg | Freq: Once | INTRAVENOUS | Status: AC
  Administered 2011-09-22: 1000 mg via INTRAVENOUS

## 2011-09-22 MED ORDER — OXYCODONE HCL 5 MG PO TABS: 5.0000 mg | ORAL_TABLET | ORAL | Status: DC | PRN

## 2011-09-22 MED ORDER — CEFAZOLIN SODIUM-DEXTROSE 2-3 GM-% IV SOLR: 2.0000 g | INTRAVENOUS | Status: DC

## 2011-09-22 MED ORDER — SODIUM CHLORIDE 0.9 % IV SOLN
INTRAVENOUS | Status: DC
  Administered 2011-09-22: 1000 mL via INTRAVENOUS

## 2011-09-22 MED ORDER — 0.9 % SODIUM CHLORIDE (POUR BTL) OPTIME
TOPICAL | Status: DC | PRN
  Administered 2011-09-22: 1000 mL

## 2011-09-22 MED ORDER — ONDANSETRON HCL 4 MG/2ML IJ SOLN: 4.0000 mg | Freq: Four times a day (QID) | INTRAMUSCULAR | Status: DC | PRN

## 2011-09-22 MED ORDER — HYDROMORPHONE HCL PF 1 MG/ML IJ SOLN
INTRAMUSCULAR | Status: AC
  Filled 2011-09-22: qty 1

## 2011-09-22 MED ORDER — CEFAZOLIN SODIUM 1-5 GM-% IV SOLN
INTRAVENOUS | Status: AC
  Filled 2011-09-22: qty 50

## 2011-09-22 MED ORDER — LACTATED RINGERS IV SOLN
INTRAVENOUS | Status: DC | PRN
  Administered 2011-09-22 (×2): via INTRAVENOUS

## 2011-09-22 MED ORDER — HYDROMORPHONE HCL PF 1 MG/ML IJ SOLN
0.2500 mg | INTRAMUSCULAR | Status: DC | PRN
  Administered 2011-09-22 (×4): 0.5 mg via INTRAVENOUS

## 2011-09-22 MED ORDER — CHLORHEXIDINE GLUCONATE 4 % EX LIQD: 60.0000 mL | Freq: Once | CUTANEOUS | Status: DC

## 2011-09-22 MED ORDER — LACTATED RINGERS IV SOLN
INTRAVENOUS | Status: DC
  Administered 2011-09-22: 1000 mL via INTRAVENOUS

## 2011-09-22 MED ORDER — SUCCINYLCHOLINE CHLORIDE 20 MG/ML IJ SOLN
INTRAMUSCULAR | Status: DC | PRN
  Administered 2011-09-22: 100 mg via INTRAVENOUS

## 2011-09-22 MED ORDER — DEXAMETHASONE SODIUM PHOSPHATE 10 MG/ML IJ SOLN: 10.0000 mg | Freq: Once | INTRAMUSCULAR | Status: DC

## 2011-09-22 MED ORDER — SODIUM CHLORIDE 0.9 % IV SOLN
INTRAVENOUS | Status: DC
  Administered 2011-09-23: 06:00:00 via INTRAVENOUS

## 2011-09-22 MED ORDER — METHOCARBAMOL 100 MG/ML IJ SOLN
500.0000 mg | Freq: Four times a day (QID) | INTRAVENOUS | Status: DC | PRN
  Filled 2011-09-22: qty 5

## 2011-09-22 MED ORDER — PROMETHAZINE HCL 25 MG/ML IJ SOLN: 6.2500 mg | INTRAMUSCULAR | Status: DC | PRN

## 2011-09-22 MED ORDER — METOCLOPRAMIDE HCL 10 MG PO TABS: 5.0000 mg | ORAL_TABLET | Freq: Three times a day (TID) | ORAL | Status: DC | PRN

## 2011-09-22 MED ORDER — SODIUM CHLORIDE 0.9 % IJ SOLN
INTRAMUSCULAR | Status: DC | PRN
  Administered 2011-09-22: 50 mL

## 2011-09-22 MED ORDER — PROPOFOL 10 MG/ML IV EMUL
INTRAVENOUS | Status: DC | PRN
  Administered 2011-09-22: 150 mg via INTRAVENOUS

## 2011-09-22 MED ORDER — METHOCARBAMOL 500 MG PO TABS: 500.0000 mg | ORAL_TABLET | Freq: Four times a day (QID) | ORAL | Status: DC

## 2011-09-22 MED ORDER — OXYCODONE HCL 5 MG PO TABS
5.0000 mg | ORAL_TABLET | ORAL | Status: DC | PRN
  Administered 2011-09-23 (×2): 10 mg via ORAL
  Filled 2011-09-22 (×2): qty 2

## 2011-09-22 MED ORDER — ONDANSETRON HCL 4 MG PO TABS: 4.0000 mg | ORAL_TABLET | Freq: Four times a day (QID) | ORAL | Status: DC | PRN

## 2011-09-22 MED ORDER — METOCLOPRAMIDE HCL 5 MG/ML IJ SOLN
5.0000 mg | Freq: Three times a day (TID) | INTRAMUSCULAR | Status: DC | PRN
Start: 2011-09-22 — End: 2011-09-23

## 2011-09-22 MED ORDER — ONDANSETRON HCL 4 MG/2ML IJ SOLN
INTRAMUSCULAR | Status: DC | PRN
  Administered 2011-09-22: 4 mg via INTRAVENOUS

## 2011-09-22 MED ORDER — ROCURONIUM BROMIDE 100 MG/10ML IV SOLN
INTRAVENOUS | Status: DC | PRN
  Administered 2011-09-22: 5 mg via INTRAVENOUS

## 2011-09-22 MED ORDER — METHOCARBAMOL 500 MG PO TABS
500.0000 mg | ORAL_TABLET | Freq: Four times a day (QID) | ORAL | Status: DC | PRN
  Administered 2011-09-23: 500 mg via ORAL
  Filled 2011-09-22: qty 1

## 2011-09-22 MED ORDER — ENOXAPARIN SODIUM 40 MG/0.4ML ~~LOC~~ SOLN
40.0000 mg | SUBCUTANEOUS | Status: DC
  Filled 2011-09-22: qty 0.4

## 2011-09-22 MED ORDER — BUPIVACAINE LIPOSOME 1.3 % IJ SUSP
20.0000 mL | Freq: Once | INTRAMUSCULAR | Status: AC
  Administered 2011-09-22: 20 mL
  Filled 2011-09-22: qty 20

## 2011-09-22 MED ORDER — ACETAMINOPHEN 10 MG/ML IV SOLN
INTRAVENOUS | Status: AC
  Filled 2011-09-22: qty 100

## 2011-09-22 MED ORDER — SODIUM CHLORIDE 0.9 % IV SOLN: INTRAVENOUS | Status: DC

## 2011-09-22 MED ORDER — CEFAZOLIN SODIUM 1-5 GM-% IV SOLN
INTRAVENOUS | Status: DC | PRN
  Administered 2011-09-22: 1 g via INTRAVENOUS

## 2011-09-22 MED ORDER — MIDAZOLAM HCL 5 MG/5ML IJ SOLN
INTRAMUSCULAR | Status: DC | PRN
  Administered 2011-09-22: 0.5 mg via INTRAVENOUS

## 2011-09-22 SURGICAL SUPPLY — 39 items
ANCHOR SUPER QUICK (Anchor) ×2 IMPLANT
BAG ZIPLOCK 12X15 (MISCELLANEOUS) ×2 IMPLANT
BLADE EXTENDED COATED 6.5IN (ELECTRODE) IMPLANT
CLOTH BEACON ORANGE TIMEOUT ST (SAFETY) ×2 IMPLANT
DRAPE INCISE IOBAN 66X45 STRL (DRAPES) ×2 IMPLANT
DRAPE ORTHO SPLIT 77X108 STRL (DRAPES) ×2
DRAPE POUCH INSTRU U-SHP 10X18 (DRAPES) ×2 IMPLANT
DRAPE SURG ORHT 6 SPLT 77X108 (DRAPES) ×2 IMPLANT
DRAPE U-SHAPE 47X51 STRL (DRAPES) ×2 IMPLANT
DRSG ADAPTIC 3X8 NADH LF (GAUZE/BANDAGES/DRESSINGS) ×2 IMPLANT
DRSG MEPILEX BORDER 4X4 (GAUZE/BANDAGES/DRESSINGS) IMPLANT
DRSG MEPILEX BORDER 4X8 (GAUZE/BANDAGES/DRESSINGS) ×2 IMPLANT
ELECT REM PT RETURN 9FT ADLT (ELECTROSURGICAL) ×2
ELECTRODE REM PT RTRN 9FT ADLT (ELECTROSURGICAL) ×1 IMPLANT
GLOVE BIO SURGEON STRL SZ7.5 (GLOVE) ×2 IMPLANT
GLOVE BIO SURGEON STRL SZ8 (GLOVE) ×4 IMPLANT
GLOVE BIOGEL PI IND STRL 6.5 (GLOVE) ×1 IMPLANT
GLOVE BIOGEL PI IND STRL 7.5 (GLOVE) ×1 IMPLANT
GLOVE BIOGEL PI INDICATOR 6.5 (GLOVE) ×1
GLOVE BIOGEL PI INDICATOR 7.5 (GLOVE) ×1
GLOVE SURG SS PI 6.5 STRL IVOR (GLOVE) ×2 IMPLANT
GOWN STRL NON-REIN LRG LVL3 (GOWN DISPOSABLE) ×4 IMPLANT
GOWN STRL REIN XL XLG (GOWN DISPOSABLE) ×4 IMPLANT
IV SET HUBERPLUS 22X1 SAFETY (NEEDLE) ×2 IMPLANT
MANIFOLD NEPTUNE II (INSTRUMENTS) ×2 IMPLANT
NS IRRIG 1000ML POUR BTL (IV SOLUTION) ×2 IMPLANT
PACK TOTAL JOINT (CUSTOM PROCEDURE TRAY) ×2 IMPLANT
POSITIONER SURGICAL ARM (MISCELLANEOUS) ×2 IMPLANT
SPONGE GAUZE 4X4 12PLY (GAUZE/BANDAGES/DRESSINGS) ×2 IMPLANT
STAPLER VISISTAT 35W (STAPLE) ×2 IMPLANT
STRIP CLOSURE SKIN 1/2X4 (GAUZE/BANDAGES/DRESSINGS) ×2 IMPLANT
SUT MNCRL AB 4-0 PS2 18 (SUTURE) ×2 IMPLANT
SUT VIC AB 1 CT1 27 (SUTURE) ×3
SUT VIC AB 1 CT1 27XBRD ANTBC (SUTURE) ×3 IMPLANT
SUT VIC AB 2-0 CT1 27 (SUTURE) ×3
SUT VIC AB 2-0 CT1 TAPERPNT 27 (SUTURE) ×3 IMPLANT
SYR 30ML LL (SYRINGE) ×2 IMPLANT
TOWEL OR 17X26 10 PK STRL BLUE (TOWEL DISPOSABLE) ×4 IMPLANT
WATER STERILE IRR 1500ML POUR (IV SOLUTION) ×2 IMPLANT

## 2011-09-22 NOTE — Brief Op Note (Signed)
09/22/2011  4:47 PM  PATIENT:  Amanda Shaw  78 y.o. female  PRE-OPERATIVE DIAGNOSIS:  right hip intractable bursitis with gluteal tendon tear  POST-OPERATIVE DIAGNOSIS:  right hip intractable bursitis with gluteal tendon tear  PROCEDURE:  Procedure(s) (LRB): EXCISION/RELEASE BURSA HIP (Right) OPEN SURGICAL REPAIR OF GLUTEAL TENDON (Right)  SURGEON:  Surgeon(s) and Role:    * Loanne Drilling, MD - Primary  PHYSICIAN ASSISTANT:   ASSISTANTS: Avel Peace, PA-C   ANESTHESIA:   general  EBL:  Total I/O In: 1000 [I.V.:1000] Out: -   COUNTS:  YES  TOURNIQUET:  * No tourniquets in log *  DICTATION: .Other Dictation: Dictation Number [id number]  PLAN OF CARE: Discharge to home after PACU  PATIENT DISPOSITION:  PACU - hemodynamically stable.

## 2011-09-22 NOTE — Anesthesia Postprocedure Evaluation (Signed)
  Anesthesia Post-op Note  Patient: Amanda Shaw  Procedure(s) Performed: Procedure(s) (LRB): EXCISION/RELEASE BURSA HIP (Right) OPEN SURGICAL REPAIR OF GLUTEAL TENDON (Right)  Patient Location: PACU  Anesthesia Type: General  Level of Consciousness: oriented and sedated  Airway and Oxygen Therapy: Patient Spontanous Breathing and Patient connected to nasal cannula oxygen  Post-op Pain: mild  Post-op Assessment: Post-op Vital signs reviewed, Patient's Cardiovascular Status Stable, Respiratory Function Stable and Patent Airway  Post-op Vital Signs: stable  Complications: No apparent anesthesia complications

## 2011-09-22 NOTE — Anesthesia Preprocedure Evaluation (Signed)
Anesthesia Evaluation  Patient identified by MRN, date of birth, ID band Patient awake    Reviewed: Allergy & Precautions, H&P , NPO status , Patient's Chart, lab work & pertinent test results, reviewed documented beta blocker date and time   Airway Mallampati: II TM Distance: >3 FB Neck ROM: Full    Dental  (+) Lower Dentures and Dental Advisory Given   Pulmonary neg pulmonary ROS,  clear to auscultation  + decreased breath sounds      Cardiovascular hypertension, Pt. on medications Regular Normal Denies cardiac symptoms   Neuro/Psych Negative Neurological ROS  Negative Psych ROS   GI/Hepatic negative GI ROS, Neg liver ROS,   Endo/Other  Negative Endocrine ROS  Renal/GU negative Renal ROS  Genitourinary negative   Musculoskeletal negative musculoskeletal ROS (+)   Abdominal   Peds negative pediatric ROS (+)  Hematology negative hematology ROS (+)   Anesthesia Other Findings   Reproductive/Obstetrics negative OB ROS                           Anesthesia Physical Anesthesia Plan  ASA: II  Anesthesia Plan: General   Post-op Pain Management:    Induction: Intravenous  Airway Management Planned: Oral ETT  Additional Equipment:   Intra-op Plan:   Post-operative Plan: Extubation in OR  Informed Consent: I have reviewed the patients History and Physical, chart, labs and discussed the procedure including the risks, benefits and alternatives for the proposed anesthesia with the patient or authorized representative who has indicated his/her understanding and acceptance.   Dental advisory given  Plan Discussed with: CRNA and Surgeon  Anesthesia Plan Comments:         Anesthesia Quick Evaluation

## 2011-09-22 NOTE — Transfer of Care (Signed)
Immediate Anesthesia Transfer of Care Note  Patient: Amanda Shaw  Procedure(s) Performed: Procedure(s) (LRB): EXCISION/RELEASE BURSA HIP (Right) OPEN SURGICAL REPAIR OF GLUTEAL TENDON (Right)  Patient Location: PACU  Anesthesia Type: General  Level of Consciousness: awake, alert , oriented and responds to stimulation  Airway & Oxygen Therapy: Patient Spontanous Breathing and Patient connected to face mask oxygen  Post-op Assessment: Report given to PACU RN and Post -op Vital signs reviewed and stable  Post vital signs: stable  Complications: No apparent anesthesia complications

## 2011-09-22 NOTE — Anesthesia Postprocedure Evaluation (Signed)
  Anesthesia Post-op Note  Patient: Amanda Shaw  Procedure(s) Performed: Procedure(s) (LRB): EXCISION/RELEASE BURSA HIP (Right) OPEN SURGICAL REPAIR OF GLUTEAL TENDON (Right)  Patient Location: PACU  Anesthesia Type: General  Level of Consciousness: oriented and sedated  Airway and Oxygen Therapy: Patient Spontanous Breathing and Patient connected to nasal cannula oxygen  Post-op Pain: mild  Post-op Assessment: Post-op Vital signs reviewed, Patient's Cardiovascular Status Stable, Respiratory Function Stable and Patent Airway  Post-op Vital Signs: stable  Complications: No apparent anesthesia complications 

## 2011-09-22 NOTE — Interval H&P Note (Signed)
History and Physical Interval Note:  09/22/2011 3:34 PM  Amanda Shaw  has presented today for surgery, with the diagnosis of right hip bursitis  The various methods of treatment have been discussed with the patient and family. After consideration of risks, benefits and other options for treatment, the patient has consented to  Procedure(s) (LRB): EXCISION/RELEASE BURSA HIP (Right) OPEN SURGICAL REPAIR OF GLUTEAL TENDON (Right) as a surgical intervention .  The patients' history has been reviewed, patient examined, no change in status, stable for surgery.  I have reviewed the patients' chart and labs.  Questions were answered to the patient's satisfaction.     Loanne Drilling

## 2011-09-23 MED ORDER — METHOCARBAMOL 500 MG PO TABS: 500.0000 mg | ORAL_TABLET | Freq: Four times a day (QID) | ORAL | Status: AC | PRN

## 2011-09-23 MED ORDER — OXYCODONE HCL 5 MG PO TABS: 5.0000 mg | ORAL_TABLET | ORAL | Status: AC | PRN

## 2011-09-23 MED ORDER — ENOXAPARIN SODIUM 40 MG/0.4ML ~~LOC~~ SOLN: 40.0000 mg | SUBCUTANEOUS | Status: DC

## 2011-09-23 NOTE — Evaluation (Signed)
Physical Therapy Evaluation Patient Details Name: Amanda Shaw MRN: [id number] DOB: 02/29/1930 Today's Date: 09/23/2011  Problem List:  Patient Active Problem List  Diagnoses  . IMPAIRED GLUCOSE TOLERANCE  . HYPERLIPIDEMIA  . OVERWEIGHT  . HYPERTENSION, BENIGN SYSTEMIC  . HEMORRHOIDS, NOS  . VENOUS INSUFFICIENCY, CHRONIC  . CHRONIC KIDNEY DISEASE STAGE III (MODERATE)  . FIBROADENOSIS, BREAST  . CYSTOCELE/RECTOCELE/PROLAPSE,UNSPEC.  Marland Kitchen GLAUCOMA, HX OF  . Trochanteric bursitis of right hip  . Osteoarthritis of hip  . Vertigo, peripheral  . Sialadenitis  . Bacterial infection of finger or toe  . Diarrhea  . Bursitis of hip, right    Past Medical History:  Past Medical History  Diagnosis Date  . Fracture of ankle, left, closed 10/1998  . Gastritis 07/2006    by endoscopy  . Hypertension   . Arthritis     arthritis in hips    Past Surgical History:  Past Surgical History  Procedure Date  . Tonsillectomy 1956  . Cholecystectomy 1987  . Trabeculectomy     laser  . Vaginal hysterectomy 1988    benign disease  . Lumbar disc surgery [phone]  . Bladder suspension 01/2004  . Cryoablation     bridge of nose, Dr Yetta Barre  . Spider vein injection 2010    PT Assessment/Plan/Recommendation PT Assessment Clinical Impression Statement: Patient s/p right hip surgery for bursa excision and glut tendon repair presents with decreased mobility due to pain and limited AROM/strength in the LE's.  She will benefit from skilled PT in the acute setting to maximize independence and allow d/c home with spouse assist. PT Recommendation/Assessment: Patient will need skilled PT in the acute care venue PT Problem List: Decreased strength;Decreased range of motion;Decreased activity tolerance;Decreased mobility;Pain;Decreased knowledge of use of DME PT Therapy Diagnosis : Difficulty walking;Acute pain PT Plan PT Frequency: Min 5X/week PT Treatment/Interventions: Gait  training;Functional mobility training;Therapeutic activities;Therapeutic exercise;Patient/family education PT Recommendation Follow Up Recommendations: Home health PT (though patient refuses) Equipment Recommended: None recommended by PT PT Goals  Acute Rehab PT Goals PT Goal Formulation: With patient/family Time For Goal Achievement: 5 days Pt will go Supine/Side to Sit: with modified independence PT Goal: Supine/Side to Sit - Progress: Goal set today Pt will go Sit to Supine/Side: with modified independence PT Goal: Sit to Supine/Side - Progress: Goal set today Pt will go Sit to Stand: with modified independence PT Goal: Sit to Stand - Progress: Goal set today Pt will go Stand to Sit: with modified independence PT Goal: Stand to Sit - Progress: Goal set today Pt will Ambulate: 51 - 150 feet;with modified independence;with least restrictive assistive device PT Goal: Ambulate - Progress: Goal set today Pt will Perform Home Exercise Program: Independently PT Goal: Perform Home Exercise Program - Progress: Goal set today  PT Evaluation Precautions/Restrictions  Precautions Precautions: Fall Restrictions Weight Bearing Restrictions: Yes RLE Weight Bearing: Weight bearing as tolerated Prior Functioning  Home Living Lives With: Spouse Type of Home: House Home Layout: One level Home Access: Stairs to enter Entrance Stairs-Rails: None Entrance Stairs-Number of Steps: 2 Home Adaptive Equipment: Walker - standard;Bedside commode/3-in-1 Prior Function Level of Independence: Independent with basic ADLs;Independent with transfers;Independent with gait;Independent with homemaking with ambulation Cognition Cognition Arousal/Alertness: Awake/alert Overall Cognitive Status: Appears within functional limits for tasks assessed Sensation/Coordination   Extremity Assessment RLE Assessment RLE Assessment: Exceptions to [facility] RLE AROM (degrees) Overall AROM Right Lower Extremity: Deficits;Due  to pain RLE Strength RLE Overall Strength Comments: limited due to pain, positive quad set  and assist for hip/knee flexion LLE Assessment LLE Assessment: Within Functional Limits Mobility (including Balance) Bed Mobility Bed Mobility: Yes Supine to Sit: 3: Mod assist Supine to Sit Details (indicate cue type and reason): assist to scoot over in bed and to lift up into sitting position Sitting - Scoot to Edge of Bed: 4: Min assist Sitting - Scoot to Edge of Bed Details (indicate cue type and reason): for right LE Transfers Transfers: Yes Sit to Stand: 4: Min assist;With upper extremity assist;From bed;From chair/3-in-1;With armrests Sit to Stand Details (indicate cue type and reason): cues for safety with hand placement Stand to Sit: 4: Min assist;5: Supervision;With upper extremity assist;With armrests;To chair/3-in-1 Stand to Sit Details: cues for hand placement Stand Pivot Transfers: 4: Min assist Stand Pivot Transfer Details (indicate cue type and reason): with rolling walker and cues bed to 3:1, then 3:1 to recliner  Ambulation/Gait Ambulation/Gait: No (deferred due to pain meds just given)    Exercise  Total Joint Exercises Ankle Circles/Pumps: AROM;Both;10 reps;Supine Quad Sets: AROM;Right;5 reps;Supine Heel Slides: AAROM;Right;5 reps;Supine End of Session PT - End of Session Equipment Utilized During Treatment: Gait belt Activity Tolerance: Patient limited by pain Patient left: in chair;with call bell in reach General Behavior During Session: [facility] for tasks performed Cognition: [facility] for tasks performed  [facility] 09/23/2011, 2:19 PM

## 2011-09-23 NOTE — Progress Notes (Signed)
Physical Therapy Treatment Patient Details Name: Amanda Shaw MRN: [id number] DOB: 13-Aug-1929 Today's Date: 09/23/2011  PT Assessment/Plan  PT - Assessment/Plan Comments on Treatment Session: Educated pt/spouse in HEP, car transfers and stairs technique as well as in assistance for safety with ambulation.  Recommended HHPT. though patient doesn't feel she needs it.  Safe for d/c home with spouse assist. PT Plan: Discharge plan remains appropriate PT Frequency: Min 5X/week Follow Up Recommendations: Home health PT Equipment Recommended: None recommended by PT PT Goals  Acute Rehab PT Goals PT Goal Formulation: With patient/family Time For Goal Achievement: 5 days Pt will go Sit to Stand: with modified independence PT Goal: Sit to Stand - Progress: Progressing toward goal Pt will go Stand to Sit: with modified independence PT Goal: Stand to Sit - Progress: Progressing toward goal Pt will Ambulate: 51 - 150 feet;with modified independence;with least restrictive assistive device PT Goal: Ambulate - Progress: Progressing toward goal Pt will Perform Home Exercise Program: Independently PT Goal: Perform Home Exercise Program - Progress: Progressing toward goal  PT Treatment Precautions/Restrictions  Precautions Precautions: Fall Restrictions Weight Bearing Restrictions: Yes RLE Weight Bearing: Weight bearing as tolerated Mobility (including Balance) Transfers Transfers: Yes Sit to Stand: 5: Supervision;With upper extremity assist;From chair/3-in-1 Sit to Stand Details (indicate cue type and reason): cues for safety with hand placement Stand to Sit: 5: Supervision;With upper extremity assist;To chair/3-in-1 Stand to Sit Details: cues for hand placement Ambulation/Gait Ambulation/Gait: Yes Ambulation/Gait Assistance: 5: Supervision Ambulation/Gait Assistance Details (indicate cue type and reason): cues for sequence Ambulation Distance (Feet): 150 Feet (60' then 63' with  seated rest in between) Assistive device: Standard walker Gait Pattern: Step-to pattern;Antalgic Stairs: No (educated pt/spouse on technique handout given)    Exercise  Other Exercises Other Exercises: instructed on HEP for rigjht LE to include quad sets, ankle pumps, heel slides assisted and short arc quads.  Cautioned against unassisted hip abduction.  Issued handout to patient. End of Session PT - End of Session Equipment Utilized During Treatment: Gait belt Activity Tolerance: Patient tolerated treatment well Patient left: in chair;with family/visitor present General Behavior During Session: [facility] for tasks performed Cognition: [facility] for tasks performed  [facility] 09/23/2011, 2:27 PM

## 2011-09-23 NOTE — Discharge Instructions (Addendum)
Pick up stool softner and laxative for home. Do not submerge incision under water. May shower starting Saturday. Continue to use ice for pain and swelling from surgery. After finishing the Lovenox, Resume Baby Aspirin.

## 2011-09-23 NOTE — Progress Notes (Signed)
Subjective: 1 Day Post-Op Procedure(s) (LRB): EXCISION/RELEASE BURSA HIP (Right) OPEN SURGICAL REPAIR OF GLUTEAL TENDON (Right) Patient reports pain as mild.   Patient has complaints of soreness in hip.  Has not been up yet.  Will get PT to work with patient and then home.  Objective: Vital signs in last 24 hours: Temp:  [97.3 F (36.3 C)-98.2 F (36.8 C)] 97.5 F (36.4 C) (02/21 0623) Pulse Rate:  [71-96] 85  (02/21 0623) Resp:  [9-27] 18  (02/21 0623) BP: (101-143)/(51-81) 129/67 mmHg (02/21 0623) SpO2:  [77 %-100 %] 95 % (02/21 0623) Weight:  [70.761 kg (156 lb)] 70.761 kg (156 lb) (02/20 2130)  Intake/Output from previous day:  Intake/Output Summary (Last 24 hours) at 09/23/11 0853 Last data filed at 09/23/11 0700  Gross per 24 hour  Intake   2890 ml  Output    900 ml  Net   1990 ml    Intake/Output this shift:    Labs: Results for orders placed during the hospital encounter of 09/14/11  SURGICAL PCR SCREEN      Component Value Range   MRSA, PCR NEGATIVE  NEGATIVE    Staphylococcus aureus NEGATIVE  NEGATIVE   CBC      Component Value Range   WBC 7.3  4.0 - 10.5 (K/uL)   RBC 4.64  3.87 - 5.11 (MIL/uL)   Hemoglobin 14.8  12.0 - 15.0 (g/dL)   HCT 16.1  09.6 - 04.5 (%)   MCV 93.8  78.0 - 100.0 (fL)   MCH 31.9  26.0 - 34.0 (pg)   MCHC 34.0  30.0 - 36.0 (g/dL)   RDW 40.9  81.1 - 91.4 (%)   Platelets 250  150 - 400 (K/uL)  BASIC METABOLIC PANEL      Component Value Range   Sodium 142  135 - 145 (mEq/L)   Potassium 4.0  3.5 - 5.1 (mEq/L)   Chloride 102  96 - 112 (mEq/L)   CO2 32  19 - 32 (mEq/L)   Glucose, Bld 97  70 - 99 (mg/dL)   BUN 20  6 - 23 (mg/dL)   Creatinine, Ser 7.82  0.50 - 1.10 (mg/dL)   Calcium 95.6  8.4 - 10.5 (mg/dL)   GFR calc non Af Amer 67 (*) >90 (mL/min)   GFR calc Af Amer 78 (*) >90 (mL/min)    Exam: Neurovascular intact Sensation intact distally Incision - clean, dry, no drainage Motor function intact - moving foot and toes well on  exam.   Assessment/Plan: 1 Day Post-Op Procedure(s) (LRB): EXCISION/RELEASE BURSA HIP (Right) OPEN SURGICAL REPAIR OF GLUTEAL TENDON (Right) Procedure(s) (LRB): EXCISION/RELEASE BURSA HIP (Right) OPEN SURGICAL REPAIR OF GLUTEAL TENDON (Right) Past Medical History  Diagnosis Date  . Fracture of ankle, left, closed 10/1998  . Gastritis 07/2006    by endoscopy  . Hypertension   . Arthritis     arthritis in hips    Principal Problem:  *Bursitis of hip, right   Advance diet Up with therapy Discharge home with home health Diet - heart healthy Follow up - in 2 weeks Activity - WBAT Condition Upon Discharge - Stable D/C Meds - See DC Summary DVT Prophylaxis - lovenox Protocol   Amanda Shaw 09/23/2011, 8:53 AM

## 2011-09-23 NOTE — Op Note (Signed)
NAMECORDA, SHUTT NO.:  [id number]  MEDICAL RECORD NO.:  [id number]  LOCATION:  1532                         FACILITY:  [facility]  PHYSICIAN:  Ollen Gross, M.D.    DATE OF BIRTH:  06-Apr-1930  DATE OF PROCEDURE:  09/22/2011 DATE OF DISCHARGE:                              OPERATIVE REPORT   PREOPERATIVE DIAGNOSIS:  Right hip intractable bursitis with gluteal tendon tear.  POSTOPERATIVE DIAGNOSIS:  Right hip intractable bursitis with gluteal tendon tear.  PROCEDURE:  Right hip bursectomy and gluteal tendon repair.  SURGEON:  Ollen Gross, M.D.  ASSISTANT:  Alexzandrew L. Perkins, P.A.C.  ANESTHESIA:  General.  ESTIMATED BLOOD LOSS:  Minimal.  DRAINS:  None.  COMPLICATIONS:  None.  CONDITION:  Stable to Recovery.  BRIEF CLINICAL NOTE:  [name] is a 78 year old female with significant history of persistent right lateral hip pain.  She has had several injections.  I initially saw her a few weeks ago and noted that she had intractable bursitis as well as a tear of the gluteus medius tendinous seen on MRI.  She presents now for bursectomy and tendon repair.  PROCEDURE IN DETAIL:  After successful administration of general anesthetic, the patient was placed in the left lateral decubitus position with the right side up and held with the hip positioner.  Right lower extremity was isolated from her perineum with plastic drapes and prepped and draped in the usual sterile fashion.  The trochanter was palpated and an incision made about 6 cm in length, starting 3 cm above the tip of the trochanter, and coursing 3 cm distally, the tip of the trochanter appeared lateral-based incision.  Skin was cut with a 10- blade through the subcutaneous tissue to the level of fascia lata.  This was incised in line with the skin incision.  There was a very thick and hypertrophied bursa just on to the fascia lata.  This was dissected from the underlying tissue and  removed.  Once removed, it was noted that there was a significant tear in the gluteus medius tendon about the central one-third of the fibers.  It was retracted about 2 cm to 3 cm. A Mitek anchor was then passed into the greater trochanter.  There was a spur in the tip of the greater trochanter, which was removed.  The sutures from the anchor were then passed through the leading edge of the tendon and this was advanced to the tip of the greater trochanter, sewed, and tied into place.  It was very stable repair.  I over sewed it with Ethibond suture.  Meticulous hemostasis was then achieved and the wound copiously irrigated with saline solution.  The fascia lata was closed with interrupted #1 Vicryl and a triangle of tissue was removed that was overlying the tip of the trochanter.  Subcu was then closed with #1 and 2-0 Vicryl and subcuticular running 4-0 Monocryl.  Prior to this, a total of 20 cc of Exparel mixed 50 cc of saline were injected into the fascia lata, gluteal muscles, and subcu tissues.  Once close, the incision was cleaned and dried and Steri-Strips and a bulky sterile dressing were applied.  The patient was then awakened and transported to  Recovery in stable condition.  Please note that a surgical assistant was a medical necessity for this procedure to be performed in a safe and expeditious manner.  Surgical assistant was necessary for retracting vital structures to prevent injury to these structures during removal of the bursa, especially posteriorly.  The assistant was also necessary keeping the leg abducted while the tendon repair was performed in order to ensure a sturdy repair.     Ollen Gross, M.D.     FA/MEDQ  D:  09/22/2011  T:  09/23/2011  Job:  [id number]

## 2011-10-04 ENCOUNTER — Encounter (HOSPITAL_COMMUNITY): Payer: Self-pay | Admitting: Orthopedic Surgery

## 2011-10-04 NOTE — Discharge Summary (Signed)
Physician Discharge Summary   Patient ID: Amanda Shaw MRN: [id number] DOB/AGE: 1930/01/24 78 y.o.  Admit date: 09/22/2011 Discharge date: 09/23/2011  Primary Diagnosis: Right hip pain  Admission Diagnoses: Past Medical History  Diagnosis Date  . Fracture of ankle, left, closed 10/1998  . Gastritis 07/2006    by endoscopy  . Hypertension   . Arthritis     arthritis in hips    Discharge Diagnoses:  Principal Problem:  *Bursitis of hip, right  Procedure: Procedure(s) (LRB): EXCISION/RELEASE BURSA HIP (Right) OPEN SURGICAL REPAIR OF GLUTEAL TENDON (Right)   Consults: None  HPI: Amanda Shaw is a 78 year old female with significant history of persistent right lateral hip pain. She has had several injections. I initially saw her a few weeks ago and noted that she had intractable bursitis as well as a tear of the gluteus medius tendinous seen on MRI. She presents now for bursectomy and tendon  repair.  Laboratory Data: Hospital Outpatient Visit on 09/14/2011  Component Date Value Range Status  . MRSA, PCR  09/14/2011 NEGATIVE  NEGATIVE Final  . Staphylococcus aureus  09/14/2011 NEGATIVE  NEGATIVE Final   Comment:                                 The Xpert SA Assay (FDA                          approved for NASAL specimens                          only), is one component of                          a comprehensive surveillance                          program.  It is not intended                          to diagnose infection nor to                          guide or monitor treatment.  . WBC (K/uL) 09/14/2011 7.3  4.0-10.5 Final  . RBC (MIL/uL) 09/14/2011 4.64  3.87-5.11 Final  . Hemoglobin (g/dL) 40/98/1191 47.8  29.5-62.1 Final  . HCT (%) 09/14/2011 43.5  36.0-46.0 Final  . MCV (fL) 09/14/2011 93.8  78.0-100.0 Final  . MCH (pg) 09/14/2011 31.9  26.0-34.0 Final  . MCHC (g/dL) 30/86/5784 69.6  29.5-28.4 Final  . RDW (%) 09/14/2011 11.9  11.5-15.5 Final  .  Platelets (K/uL) 09/14/2011 250  150-400 Final  . Sodium (mEq/L) 09/14/2011 142  135-145 Final  . Potassium (mEq/L) 09/14/2011 4.0  3.5-5.1 Final  . Chloride (mEq/L) 09/14/2011 102  96-112 Final  . CO2 (mEq/L) 09/14/2011 32  19-32 Final  . Glucose, Bld (mg/dL) 13/24/4010 97  27-25 Final  . BUN (mg/dL) 36/64/4034 20  7-42 Final  . Creatinine, Ser (mg/dL) 59/56/3875 6.43  3.29-5.18 Final  . Calcium (mg/dL) 84/16/6063 01.6  0.1-09.3 Final  . GFR calc non Af Amer (mL/min) 09/14/2011 67* >90 Final  . GFR calc Af Amer (mL/min) 09/14/2011 78* >90 Final   Comment:  The eGFR has been calculated                          using the CKD EPI equation.                          This calculation has not been                          validated in all clinical                          situations.                          eGFR's persistently                          <90 mL/min signify                          possible Chronic Kidney Disease.   No results found for this basename: HGB:5 in the last 72 hours No results found for this basename: WBC:2,RBC:2,HCT:2,PLT:2 in the last 72 hours No results found for this basename: NA:2,K:2,CL:2,CO2:2,BUN:2,CREATININE:2,GLUCOSE:2,CALCIUM:2 in the last 72 hours No results found for this basename: LABPT:2,INR:2 in the last 72 hours  X-Rays:Dg Chest 2 View  09/14/2011  *RADIOLOGY REPORT*  Clinical Data: Preoperative evaluation.  History of hypertension.  CHEST - 2 VIEW  Comparison: 02/11/2004.  Findings: The cardiac silhouette is normal size and shape.  Slight ectasia and tortuosity of thoracic aorta is present.  No pulmonary edema, pneumonia, or pleural effusion is evident.  Mediastinal and hilar contours appear stable.  Cholecystectomy clips are present. There is osteopenic appearance of bones.  There is minimal degenerative spondylosis.  IMPRESSION: No acute cardiopulmonary or pleural abnormalities are seen. Chronic findings detailed above.   Original Report Authenticated By: Crawford Givens, M.D.    EKG: Orders placed during the hospital encounter of 09/22/11  . EKG     Hospital Course: Patient was admitted to [facility] and taken to the OR and underwent the above state procedure without complications.  Patient tolerated the procedure well and was later transferred to the recovery room and then to the orthopaedic floor for postoperative care.  They were given PO and IV analgesics for pain control following their surgery.  They were given 24 hours of postoperative antibiotics and started on DVT prophylaxis.  Progressed with therapy.   Patient was seen in rounds on day one and was ready to go home.   Discharge Medications: Prior to Admission medications   Medication Sig Start Date End Date Taking? Authorizing Provider  acetaminophen (TYLENOL) 325 MG tablet Take 650 mg by mouth every 6 (six) hours as needed. Pain    Yes Historical Provider, MD  aspirin ([employer] ASPIRIN) 81 MG chewable tablet Chew 81 mg by mouth every evening.    Yes Historical Provider, MD  Calcium Carbonate-Vitamin D (CALTRATE 600+D) 600-400 MG-UNIT per tablet Take 1 tablet by mouth daily.    Yes Historical Provider, MD  enalapril (VASOTEC) 10 MG tablet  05/26/11  Yes Tobin Chad, MD  hydrochlorothiazide (HYDRODIURIL) 25 MG tablet Take 12.5 mg by mouth daily with breakfast. 03/16/11  Yes Tobin Chad, MD  ibuprofen (ADVIL,MOTRIN) 600 MG  tablet Take 600 mg by mouth every 6 (six) hours as needed. PAIN  02/05/11  Yes Historical Provider, MD  simvastatin (ZOCOR) 20 MG tablet  06/28/11  Yes Tobin Chad, MD  enoxaparin (LOVENOX) 40 MG/0.4ML SOLN Inject 0.4 mLs (40 mg total) into the skin daily. 09/23/11   Caylea Foronda, PA  Loteprednol-Tobramycin (ZYLET) 0.5-0.3 % SUSP Place 1 drop into the left eye 3 (three) times daily. For infection in left eye started eye drop on 09/07/11 finish on eye drops prior to surgery    Historical Provider, MD   methocarbamol (ROBAXIN) 500 MG tablet Take 1 tablet (500 mg total) by mouth every 6 (six) hours as needed. 09/23/11 10/03/11  Jerod Mcquain, PA  oxyCODONE (ROXICODONE) 5 MG immediate release tablet Take 1 tablet (5 mg total) by mouth every 4 (four) hours as needed for pain. 09/23/11 10/03/11  Tylesha Gibeault, PA    Diet: heart healthy  Activity:WBAT  Follow-up:in 2 weeks  Disposition: home Discharged Condition: stable   Discharge Orders    Future Orders Please Complete By Expires   Diet - low sodium heart healthy      Call MD / Call 911      Comments:   If you experience chest pain or shortness of breath, CALL 911 and be transported to the hospital emergency room.  If you develope a fever above 101 F, pus (white drainage) or increased drainage or redness at the wound, or calf pain, call your surgeon's office.   Constipation Prevention      Comments:   Drink plenty of fluids.  Prune juice may be helpful.  You may use a stool softener, such as Colace (over the counter) 100 mg twice a day.  Use MiraLax (over the counter) for constipation as needed.   Increase activity slowly as tolerated      Discharge instructions      Comments:   You may remove your large bandage and shower in 2 days  Resume your 81 mg Aspirin tomorrow  You may bear full weight on your right leg once comfortable doing so. Use a walker until you feel comfortable enough to walk without it   Call MD / Call 911      Comments:   If you experience chest pain or shortness of breath, CALL 911 and be transported to the hospital emergency room.  If you develope a fever above 101 F, pus (white drainage) or increased drainage or redness at the wound, or calf pain, call your surgeon's office.   Constipation Prevention      Comments:   Drink plenty of fluids.  Prune juice may be helpful.  You may use a stool softener, such as Colace (over the counter) 100 mg twice a day.  Use MiraLax (over the counter) for constipation as  needed.   Increase activity slowly as tolerated      Weight Bearing as taught in Physical Therapy      Comments:   Use a walker or crutches as instructed.   Discharge instructions      Comments:   Pick up stool softner and laxative for home. Do not submerge incision under water. May shower starting Saturday. Continue to use ice for pain and swelling from surgery.    Driving restrictions      Comments:   No driving   Lifting restrictions      Comments:   No lifting     Medication List  As of 10/04/2011  7:50 AM  TAKE these medications         BAY[employer] ASPIRIN 81 MG chewable tablet   Generic drug: aspirin   Chew 81 mg by mouth every evening.      CALTRATE 600+D 600-400 MG-UNIT per tablet   Generic drug: Calcium Carbonate-Vitamin D   Take 1 tablet by mouth daily.      enalapril 10 MG tablet   Commonly known as: VASOTEC      enoxaparin 40 MG/0.4ML Soln   Commonly known as: LOVENOX   Inject 0.4 mLs (40 mg total) into the skin daily.      hydrochlorothiazide 25 MG tablet   Commonly known as: HYDRODIURIL   Take 12.5 mg by mouth daily with breakfast.      ibuprofen 600 MG tablet   Commonly known as: ADVIL,MOTRIN   Take 600 mg by mouth every 6 (six) hours as needed. PAIN        simvastatin 20 MG tablet   Commonly known as: ZOCOR      TYLENOL 325 MG tablet   Generic drug: acetaminophen   Take 650 mg by mouth every 6 (six) hours as needed. Pain        ZYLET 0.5-0.3 % Susp   Generic drug: Loteprednol-Tobramycin   Place 1 drop into the left eye 3 (three) times daily. For infection in left eye started eye drop on 09/07/11 finish on eye drops prior to surgery           Follow-up Information    Follow up with Loanne Drilling, MD. Schedule an appointment as soon as possible for a visit in 2 weeks. (Call tomorrow to make the appointment)    Contact information:   [facility] [address], [address] [place]  [address] [phone]          Signed: Patrica Duel 10/04/2011, 7:50 AM

## 2011-10-05 ENCOUNTER — Other Ambulatory Visit: Payer: Self-pay | Admitting: Family Medicine

## 2011-10-05 DIAGNOSIS — Z1231 Encounter for screening mammogram for malignant neoplasm of breast: Secondary | ICD-10-CM

## 2011-11-12 ENCOUNTER — Ambulatory Visit
Admission: RE | Admit: 2011-11-12 | Discharge: 2011-11-12 | Disposition: A | Discharge status: Home / Self Care | Payer: Medicare Other | Source: Ambulatory Visit | Attending: Family Medicine | Admitting: Family Medicine

## 2011-11-12 DIAGNOSIS — Z1231 Encounter for screening mammogram for malignant neoplasm of breast: Secondary | ICD-10-CM

## 2011-12-07 ENCOUNTER — Encounter: Payer: Self-pay | Admitting: Family Medicine

## 2011-12-09 ENCOUNTER — Encounter: Payer: Self-pay | Admitting: Home Health Services

## 2011-12-14 ENCOUNTER — Other Ambulatory Visit: Payer: Medicare Other

## 2011-12-21 ENCOUNTER — Encounter: Payer: Medicare Other | Admitting: Family Medicine

## 2011-12-21 ENCOUNTER — Encounter: Payer: Self-pay | Admitting: Home Health Services

## 2011-12-21 ENCOUNTER — Ambulatory Visit (INDEPENDENT_AMBULATORY_CARE_PROVIDER_SITE_OTHER): Payer: Medicare Other | Admitting: Home Health Services

## 2011-12-21 VITALS — BP 114/65 | HR 89 | Temp 96.6°F | Ht 64.0 in | Wt 157.2 lb

## 2011-12-21 DIAGNOSIS — Z0000 Encounter for general adult medical examination without abnormal findings: Secondary | ICD-10-CM

## 2011-12-21 NOTE — Patient Instructions (Signed)
1. Continue to work on strengthening your hip. 2. Continue to focus on eating 3-4 vegetables a day. 3. Consider starting to walk again once you feel comfortable. 4. Consider using resources to help care for your brother.

## 2011-12-21 NOTE — Progress Notes (Signed)
Patient here for annual wellness visit, patient reports: Risk Factors/Conditions needing evaluation or treatment: Pt does not have any risk factors that need evaluation. Home Safety: Pt lives with husband and brother in 1 story home.  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  Pt reports having smoke alarms. Other Information: Corrective lens: Pt wears corrective lens for reading.  Visits dr annually. Dentures: Pt has lower dentures.   Memory: Pt denies any memory problems Patient's Mini Mental Score (recorded in doc. flowsheet): 30  Balance/Gait: Pt has surgery on right hip ligament 2/13 Balance Abnormal Patient value  Sitting balance    Sit to stand    Attempts to arise    Immediate standing balance    Standing balance    Nudge    Eyes closed- Romberg    Tandem stance    Back lean    Neck Rotation    360 degree turn    Sitting down     Gait Abnormal Patient value  Initiation of gait    Step length-left    Step length-right    Step height-left    Step height-right x   Step symmetry x   Step continuity    Path deviation    Trunk movement    Walking stance x Slightly bent over      Annual Wellness Visit Requirements Recorded Today In  Medical, family, social history Past Medical, Family, Social History Section  Current providers Care team  Current medications Medications  Wt, BP, Ht, BMI Vital signs  Tobacco, alcohol, illicit drug use History  ADL Nurse Assessment  Depression Screening Nurse Assessment  Cognitive impairment Nurse Assessment  Mini Mental Status Document Flowsheet  Fall Risk Nurse Assessment  Home Safety Progress Note  End of Life Planning (welcome visit) Social Documentation  Medicare preventative services Progress Note  Risk factors/conditions needing evaluation/treatment Progress Note  Personalized health advice Patient Instructions, goals, letter  Diet & Exercise Social Documentation  Emergency Contact Social Documentation  Seat Belts Social  Documentation  Sun exposure/protection Social Documentation    Prevention Plan:   Recommended Medicare Prevention Screenings Women over 65 Test For Frequency Date of Last- BOLD if needed  Breast Cancer 1-2 yrs 4/13  Cervical Cancer 1-3 yrs Not indicated due to age  Colorectal Cancer 1-10 yrs 11/07  Osteoporosis once 5/11  Cholesterol 5 yrs 11/12  Diabetes yearly 2/13  HIV yearly declined  Influenza Shot yearly 10/12  Pneumonia Shot once 12/99  Zostavax Shot once 12/11

## 2011-12-29 ENCOUNTER — Encounter: Payer: Self-pay | Admitting: Home Health Services

## 2011-12-29 NOTE — Progress Notes (Signed)
Patient ID: Amanda Shaw, female   DOB: [birth year], 78 y.o.   MRN: [id number] I have reviewed this visit and discussed with Arlys John and agree with her documentation

## 2012-05-03 ENCOUNTER — Other Ambulatory Visit: Payer: Self-pay | Admitting: Family Medicine

## 2012-05-03 NOTE — Telephone Encounter (Signed)
Needs office visit before further refills

## 2012-05-04 NOTE — Telephone Encounter (Signed)
Patient notified

## 2012-06-08 ENCOUNTER — Ambulatory Visit (INDEPENDENT_AMBULATORY_CARE_PROVIDER_SITE_OTHER): Payer: Medicare Other | Admitting: Family Medicine

## 2012-06-08 VITALS — BP 145/85 | HR 67 | Ht 64.0 in | Wt 157.9 lb

## 2012-06-08 DIAGNOSIS — I10 Essential (primary) hypertension: Secondary | ICD-10-CM

## 2012-06-08 DIAGNOSIS — E785 Hyperlipidemia, unspecified: Secondary | ICD-10-CM

## 2012-06-08 DIAGNOSIS — M545 Low back pain: Secondary | ICD-10-CM

## 2012-06-08 DIAGNOSIS — M169 Osteoarthritis of hip, unspecified: Secondary | ICD-10-CM

## 2012-06-08 DIAGNOSIS — M9080 Osteopathy in diseases classified elsewhere, unspecified site: Secondary | ICD-10-CM

## 2012-06-08 DIAGNOSIS — M810 Age-related osteoporosis without current pathological fracture: Secondary | ICD-10-CM | POA: Insufficient documentation

## 2012-06-08 DIAGNOSIS — M48061 Spinal stenosis, lumbar region without neurogenic claudication: Secondary | ICD-10-CM | POA: Insufficient documentation

## 2012-06-08 LAB — COMPREHENSIVE METABOLIC PANEL
Albumin: 4.2 g/dL (ref 3.5–5.2)
BUN: 18 mg/dL (ref 6–23)
CO2: 33 mEq/L — ABNORMAL HIGH (ref 19–32)
Calcium: 10.4 mg/dL (ref 8.4–10.5)
Chloride: 100 mEq/L (ref 96–112)
Glucose, Bld: 91 mg/dL (ref 70–99)
Potassium: 4 mEq/L (ref 3.5–5.3)

## 2012-06-08 LAB — LIPID PANEL
Cholesterol: 148 mg/dL (ref 0–200)
HDL: 59 mg/dL (ref >39)
LDL Cholesterol: 74 mg/dL (ref 0–99)
Triglycerides: 75 mg/dL (ref <150)

## 2012-06-08 NOTE — Assessment & Plan Note (Signed)
Fasting so will check lipids.

## 2012-06-08 NOTE — Assessment & Plan Note (Signed)
Systolics under 140 on home testing so won't increase medication. Reminded to avoid salt

## 2012-06-08 NOTE — Assessment & Plan Note (Signed)
The leaning forward with walking may be a precursor of lumbar spinal stenosis, so may need an MRI in the future

## 2012-06-08 NOTE — Assessment & Plan Note (Signed)
Repeat BMD next spring since will be 5 years

## 2012-06-08 NOTE — Assessment & Plan Note (Signed)
May relate to degenerative joint disease. Recommended that she follow up with Dr Despina Hick for this.

## 2012-06-08 NOTE — Progress Notes (Signed)
  Subjective:    Patient ID: Amanda Shaw, female    DOB: January 05, 1930, 78 y.o.   MRN: [id number]  HPI right hip discomfort - Since surgery by Dr Despina Hick for lateral right hip pain due to bursitis and tendon tear, that pain has resolved. She still gets medial right proximal thigh pain particularly after intercourse, but not with walking. Pain 5/10.  Doesn't walk much and husband says that she tends to bend forward when she does.   Back pain - has had some since her remote lumbar disk surgeries, but she rarely takes Acetaminophen for it, not liking to take pain meds.  Hypertension - 105-137/69 - 108. Trying to avoid excess salt.   Vison - good with glasses. No need for glaucoma drops since her laser surgery.  Review of Systems  Constitutional: Negative for activity change, fatigue and unexpected weight change.  HENT: Negative for congestion.   Eyes: Negative for visual disturbance.  Respiratory: Negative for cough and shortness of breath.   Cardiovascular: Negative for chest pain and leg swelling.  Gastrointestinal: Negative for diarrhea and constipation.  Genitourinary: Negative for dysuria.  Musculoskeletal: Positive for back pain.       Frequent leg cramps  Skin: Positive for rash.       Purple spots dorsum right hand from minor trauma  Hematological: Bruises/bleeds easily.  Psychiatric/Behavioral: Negative for dysphoric mood.       Objective:   Physical Exam  Constitutional: She is oriented to person, place, and time. She appears well-developed and well-nourished. No distress.  HENT:  Head: Normocephalic.  Right Ear: External ear normal.  Left Ear: External ear normal.  Nose: Nose normal.  Mouth/Throat: Oropharynx is clear and moist.  Eyes: Conjunctivae normal and EOM are normal. Pupils are equal, round, and reactive to light. Right eye exhibits no discharge. Left eye exhibits no discharge.  Neck: Normal range of motion. Neck supple. No tracheal deviation present.  Thyromegaly present.  Cardiovascular: Normal rate and regular rhythm.   No murmur heard. Pulmonary/Chest: Effort normal and breath sounds normal. She has no wheezes. She has no rales.  Abdominal: Soft. She exhibits no mass. There is no tenderness.  Musculoskeletal: Normal range of motion.       Trace bilateral ankle edema  Lymphadenopathy:    She has no cervical adenopathy.  Neurological: She is alert and oriented to person, place, and time. No cranial nerve deficit. Coordination normal.  Skin:       Irregular purpura dorsum of right hand in various stages of resolution  Psychiatric: She has a normal mood and affect. Her behavior is normal. Judgment and thought content normal.       Registers 3 words, recalls 2    Gait testing was normal except left shoulder slightly lower and she        Assessment & Plan:

## 2012-06-08 NOTE — Assessment & Plan Note (Signed)
Check creatinine today. 

## 2012-06-08 NOTE — Patient Instructions (Addendum)
Please continue to measure your blood pressures and call if they are consistently above 140/90 Continue avoiding salt  I will send you your lab results  You are due for bone mineral testing this coming April.   You should take 1200 mg of Calcium and 800 to 2000 IU.   Please return to see Dr Sheffield Slider in 6 months

## 2012-06-09 ENCOUNTER — Encounter: Payer: Self-pay | Admitting: Family Medicine

## 2012-06-26 ENCOUNTER — Ambulatory Visit
Admission: RE | Admit: 2012-06-26 | Discharge: 2012-06-26 | Disposition: A | Discharge status: Home / Self Care | Payer: Medicare Other | Source: Ambulatory Visit | Attending: Family Medicine | Admitting: Family Medicine

## 2012-06-26 ENCOUNTER — Other Ambulatory Visit: Payer: Self-pay | Admitting: Family Medicine

## 2012-06-26 DIAGNOSIS — M9080 Osteopathy in diseases classified elsewhere, unspecified site: Secondary | ICD-10-CM

## 2012-06-29 ENCOUNTER — Encounter: Payer: Self-pay | Admitting: Family Medicine

## 2012-07-17 ENCOUNTER — Other Ambulatory Visit: Payer: Self-pay | Admitting: Family Medicine

## 2012-07-31 ENCOUNTER — Other Ambulatory Visit: Payer: Self-pay | Admitting: Family Medicine

## 2012-10-04 ENCOUNTER — Other Ambulatory Visit: Payer: Self-pay

## 2012-10-04 DIAGNOSIS — Z1231 Encounter for screening mammogram for malignant neoplasm of breast: Secondary | ICD-10-CM

## 2012-10-23 ENCOUNTER — Telehealth: Payer: Self-pay | Admitting: Family Medicine

## 2012-10-23 NOTE — Telephone Encounter (Signed)
Spoke with patient and husband. On Sat 02/22 patient had vomiting and diarrhea. Later Sat , vomiting stopped and diarrhea continued. Has had diarrhea 3 times today so far.  No fever. Has been drinking milk, apple juice , water. Everytime she tried to eat had diarrhea yesterday. Recommended to leave off the milk and apple juice for now and drink clear liquids  like Gator ade and Gingerale. BRAT type foods for now. No meds for diarrhea now and will check with Dr. Sheffield Slider and call patient back. States she did take Rolaids.

## 2012-10-23 NOTE — Telephone Encounter (Signed)
Husband is calling requesting for the nurse to call him back with a suggestion on how to treat diarrhea.

## 2012-10-23 NOTE — Telephone Encounter (Signed)
Patient notified

## 2012-10-23 NOTE — Telephone Encounter (Signed)
She has no contraindications to taking imodium for the diarrhea. Please let her know.

## 2012-11-13 ENCOUNTER — Ambulatory Visit
Admission: RE | Admit: 2012-11-13 | Discharge: 2012-11-13 | Disposition: A | Discharge status: Home / Self Care | Payer: Medicare Other | Source: Ambulatory Visit

## 2012-11-13 DIAGNOSIS — Z1231 Encounter for screening mammogram for malignant neoplasm of breast: Secondary | ICD-10-CM

## 2012-11-14 ENCOUNTER — Other Ambulatory Visit: Payer: Self-pay | Admitting: Family Medicine

## 2012-11-14 DIAGNOSIS — R928 Other abnormal and inconclusive findings on diagnostic imaging of breast: Secondary | ICD-10-CM

## 2012-12-06 ENCOUNTER — Ambulatory Visit
Admission: RE | Admit: 2012-12-06 | Discharge: 2012-12-06 | Disposition: A | Discharge status: Home / Self Care | Payer: Medicare Other | Source: Ambulatory Visit | Attending: Family Medicine | Admitting: Family Medicine

## 2012-12-06 DIAGNOSIS — R928 Other abnormal and inconclusive findings on diagnostic imaging of breast: Secondary | ICD-10-CM

## 2012-12-29 ENCOUNTER — Ambulatory Visit (INDEPENDENT_AMBULATORY_CARE_PROVIDER_SITE_OTHER): Payer: Medicare Other | Admitting: Family Medicine

## 2012-12-29 ENCOUNTER — Encounter: Payer: Self-pay | Admitting: Family Medicine

## 2012-12-29 VITALS — BP 117/79 | HR 75 | Ht 64.0 in | Wt 157.0 lb

## 2012-12-29 DIAGNOSIS — M169 Osteoarthritis of hip, unspecified: Secondary | ICD-10-CM

## 2012-12-29 DIAGNOSIS — M9080 Osteopathy in diseases classified elsewhere, unspecified site: Secondary | ICD-10-CM

## 2012-12-29 NOTE — Progress Notes (Signed)
  Subjective:    Patient ID: Amanda Shaw, female    DOB: Oct 02, 1929, 79 y.o.   MRN: [id number]  HPI Left hip pain for about a month. Hurts mostly when she's having sex. Does not like to take any pain medicine including over-the-counter Tylenol or Advil but on the one or 2 occasions she is taking it, it does seem to help the pain. Pain radiates from the lateral part of her hip to mid groin. Walking does not seem to aggravate it.  Had similar pain in her right hip 3 years ago when she evidently had some type of surgery removing the greater trochanteric bursa. That pain was there for about 3 years until she had some type of hip injection and has resolved now, although she still cannot lay on that side at night because the area since that period  She is here today with her husband. He has a lot of questions about her prior surgery, whether or not a pelvic examination done to 3 years ago by another provider could have been problematic in the origin of her hip pain. He also mostly look at several skin lesions on her leg.  PERTINENT  PMH / PSH: History of what sounds like bursectomy on the right hip, question of whether or not she had IT band tear at that time. I do not have records History of bladder sling procedure Per Dr. Martin Majestic notes there is a question of early spinal stenosis.  IMAGING: There are no x-rays of hip or pelvis available.   Review of Systems Denies bowel or bladder incontinence, lower extremity weakness or numbness. No unusual weight loss or gain.    Objective:   Physical Exam  Vital signs are reviewed GENERAL: Well-developed female no acute distress HIPS: Full range of motion internal and external rotation bilaterally. Hip flexor strength is 5 out of 5 bilaterally. Knee extension and flexion 5 out of 5 bilaterally. Left hip tender to palpation over the greater trochanteric bursa area. SKIN: 2 or 3 flat macular hyperpigmented areas, all appear benign and have no  worrisome characteristics. Located left posterior knee/popliteal space and bilateral forearms.  INJECTION: Patient was given informed consent, signed copy in the chart. Appropriate time out was taken. Area prepped and draped in usual sterile fashion. One cc of methylprednisolone 80 mg/ml plus  4 left greater trochanteric bursa cc of 1% lidocaine without epinephrine was injected into the perpendicular using a(n) perpendicular approach. The patient tolerated the procedure well. There were no complications. Post procedure instructions were given.       Assessment & Plan:  #1. Left hip and groin pain I think this certainly got a component of greater trochanteric bursitis. We will do injection today. I tried to answer other questions about her prior hip issues.  If this does not resolve her pain now recommend hip x-rays to look for DJD. There may be a component that is well. I did talk to her at length about use of Tylenol, she was concerned about becoming addicted and I think acetaminophen better for her than NSAIDS given her CKD 3.Marland Kitchen Hope I have reassured her about that. She does not have resolution she can either follow up with me or PCP, Dr. Sheffield Slider. #2. Several skin lesions consistent with benign issues.

## 2013-02-19 ENCOUNTER — Encounter: Payer: Self-pay | Admitting: Home Health Services

## 2013-03-09 ENCOUNTER — Other Ambulatory Visit: Payer: Self-pay | Admitting: Orthopedic Surgery

## 2013-03-09 DIAGNOSIS — M48061 Spinal stenosis, lumbar region without neurogenic claudication: Secondary | ICD-10-CM

## 2013-03-13 ENCOUNTER — Ambulatory Visit
Admission: RE | Admit: 2013-03-13 | Discharge: 2013-03-13 | Disposition: A | Discharge status: Home / Self Care | Payer: Medicare Other | Source: Ambulatory Visit | Attending: Orthopedic Surgery | Admitting: Orthopedic Surgery

## 2013-03-13 VITALS — BP 148/66 | HR 74

## 2013-03-13 DIAGNOSIS — M545 Low back pain: Secondary | ICD-10-CM

## 2013-03-13 DIAGNOSIS — M48061 Spinal stenosis, lumbar region without neurogenic claudication: Secondary | ICD-10-CM

## 2013-03-13 MED ORDER — DIAZEPAM 5 MG PO TABS: 5.0000 mg | ORAL_TABLET | Freq: Once | ORAL | Status: DC

## 2013-03-13 MED ORDER — IOHEXOL 180 MG/ML  SOLN
18.0000 mL | Freq: Once | INTRAMUSCULAR | Status: AC | PRN
  Administered 2013-03-13: 15 mL via INTRATHECAL

## 2013-03-20 ENCOUNTER — Ambulatory Visit (INDEPENDENT_AMBULATORY_CARE_PROVIDER_SITE_OTHER): Payer: Medicare Other | Admitting: Family Medicine

## 2013-03-20 ENCOUNTER — Encounter: Payer: Self-pay | Admitting: Family Medicine

## 2013-03-20 VITALS — BP 152/86 | HR 69 | Ht 63.75 in | Wt 149.0 lb

## 2013-03-20 DIAGNOSIS — R350 Frequency of micturition: Secondary | ICD-10-CM

## 2013-03-20 DIAGNOSIS — I10 Essential (primary) hypertension: Secondary | ICD-10-CM

## 2013-03-20 DIAGNOSIS — E739 Lactose intolerance, unspecified: Secondary | ICD-10-CM

## 2013-03-20 DIAGNOSIS — M48061 Spinal stenosis, lumbar region without neurogenic claudication: Secondary | ICD-10-CM

## 2013-03-20 DIAGNOSIS — N318 Other neuromuscular dysfunction of bladder: Secondary | ICD-10-CM

## 2013-03-20 DIAGNOSIS — R358 Other polyuria: Secondary | ICD-10-CM

## 2013-03-20 LAB — POCT URINALYSIS DIPSTICK
Bilirubin, UA: NEGATIVE
Ketones, UA: NEGATIVE
Nitrite, UA: POSITIVE
Protein, UA: NEGATIVE
pH, UA: 7

## 2013-03-20 LAB — POCT UA - MICROSCOPIC ONLY

## 2013-03-20 LAB — BASIC METABOLIC PANEL
Calcium: 10.7 mg/dL — ABNORMAL HIGH (ref 8.4–10.5)
Potassium: 4.5 mEq/L (ref 3.5–5.3)
Sodium: 136 mEq/L (ref 135–145)

## 2013-03-20 NOTE — Patient Instructions (Addendum)
I will be retiring in the summer of this year. Thank you for allowing me to work with you in maintaining your health. Donnella Sham, MD will be my replacement beginning the second week in August. He is moving to [place] having completed his family medicine residency training in [place], [place]. Please let me know if you would like to be assigned to a different physician.   I will call you with your lab tests  Please return to see Dr Randolm Idol in 3months.

## 2013-03-20 NOTE — Assessment & Plan Note (Signed)
Urine suggests possible UTI. cultured

## 2013-03-20 NOTE — Progress Notes (Signed)
  Subjective:    Patient ID: Amanda Shaw, female    DOB: [birth year], 79 y.o.   MRN: [id number]  HPI Lumbar spinal stenosis - per scans by Dr Darrelyn Hillock, but plans to see Dr Newell Coral next week since he did her third surgery. No loss of bowel or bladder control. Pain mainly goes down her left leg. The NP in his office gave her a shot in the greater trochanter area yesterday. Walking with a walker and has a  potty chair  Polyuria and urgency - started suddenly 3 weeks ago without burning dysuria. Has been more thirsty with increased appetite.   blood pressure - 99/68, 88/65, 103/70 at home. Not lightheaded.  Review of Systems     Objective:   Physical Exam        Assessment & Plan:

## 2013-03-20 NOTE — Assessment & Plan Note (Signed)
Higher in the office. No ORTHOSTATIC HYPOTENSION here

## 2013-03-20 NOTE — Assessment & Plan Note (Signed)
Only taking acetaminophen Considering surgery.  May benefit from gabapentin

## 2013-03-20 NOTE — Assessment & Plan Note (Signed)
BMET to rule out hyperglycemia as cause of polyuria

## 2013-03-22 ENCOUNTER — Telehealth: Payer: Self-pay | Admitting: Family Medicine

## 2013-03-22 NOTE — Telephone Encounter (Signed)
The patient is returning a call Dr. Sheffield Slider and will wait his return call at her home #.

## 2013-03-23 ENCOUNTER — Telehealth: Payer: Self-pay | Admitting: Family Medicine

## 2013-03-23 LAB — URINE CULTURE

## 2013-03-23 MED ORDER — CEPHALEXIN 500 MG PO CAPS: 500.0000 mg | ORAL_CAPSULE | Freq: Three times a day (TID) | ORAL | Status: DC

## 2013-03-23 NOTE — Telephone Encounter (Signed)
I left a message on her phone recorder that her urine culture was positive and I am sending a prescription into her pharmacy.

## 2013-03-27 ENCOUNTER — Other Ambulatory Visit: Payer: Self-pay | Admitting: Neurosurgery

## 2013-04-09 ENCOUNTER — Encounter (HOSPITAL_COMMUNITY): Payer: Self-pay

## 2013-04-09 ENCOUNTER — Encounter (HOSPITAL_COMMUNITY)
Admission: RE | Admit: 2013-04-09 | Discharge: 2013-04-09 | Disposition: A | Discharge status: Home / Self Care | Payer: Medicare Other | Source: Ambulatory Visit | Attending: Anesthesiology | Admitting: Anesthesiology

## 2013-04-09 ENCOUNTER — Encounter (HOSPITAL_COMMUNITY)
Admission: RE | Admit: 2013-04-09 | Discharge: 2013-04-09 | Disposition: A | Discharge status: Home / Self Care | Payer: Medicare Other | Source: Ambulatory Visit | Attending: Neurosurgery | Admitting: Neurosurgery

## 2013-04-09 LAB — BASIC METABOLIC PANEL
BUN: 17 mg/dL (ref 6–23)
Chloride: 99 mEq/L (ref 96–112)
GFR calc non Af Amer: 64 mL/min — ABNORMAL LOW (ref >90)
Glucose, Bld: 102 mg/dL — ABNORMAL HIGH (ref 70–99)
Potassium: 3.9 mEq/L (ref 3.5–5.1)

## 2013-04-09 LAB — ABO/RH: ABO/RH(D): A POS

## 2013-04-09 LAB — CBC
HCT: 45 % (ref 36.0–46.0)
Hemoglobin: 15.7 g/dL — ABNORMAL HIGH (ref 12.0–15.0)
MCH: 32.4 pg (ref 26.0–34.0)
MCHC: 34.9 g/dL (ref 30.0–36.0)

## 2013-04-09 NOTE — Pre-Procedure Instructions (Addendum)
Amanda Shaw  04/09/2013   Your procedure is scheduled on: 04/12/13  Report to [facility] at 845 AM.  Call this number if you have problems the morning of surgery: [phone]   Remember:   Do not eat food or drink liquids after midnight.   Take these medicines the morning of surgery with A SIP OF WATER: *none                         STOP aspirin now, also any nsaids, blood thinners, herbal meds   Do not wear jewelry, make-up or nail polish.  Do not wear lotions, powders, or perfumes. You may wear deodorant.  Do not shave 48 hours prior to surgery. Men may shave face and neck.  Do not bring valuables to the hospital.  [facility] is not responsible                   for any belongings or valuables.  Contacts, dentures or bridgework may not be worn into surgery.  Leave suitcase in the car. After surgery it may be brought to your room.  For patients admitted to the hospital, checkout time is 11:00 AM the day of  discharge.   Patients discharged the day of surgery will not be allowed to drive  home.  Name and phone number of your driver:   Special Instructions: Shower using CHG 2 nights before surgery and the night before surgery.  If you shower the day of surgery use CHG.  Use special wash - you have one bottle of CHG for all showers.  You should use approximately 1/3 of the bottle for each shower.   Please read over the following fact sheets that you were given: Pain Booklet, Coughing and Deep Breathing, Blood Transfusion Information, MRSA Information and Surgical Site Infection Prevention

## 2013-04-10 NOTE — Progress Notes (Signed)
Anesthesia chart review: Patient is an 79 year old female scheduled for L3-4 decompression with PLIF on 04/12/2013 by Dr. Newell Coral. History includes nonsmoker, hypertension, arthritis, gastritis 2007, tonsillectomy, hysterectomy, cholecystectomy, prior back surgery, appendectomy, bladder suspension, right hip bursectomy and gluteal tendon repair 09/22/11. She receives primary care through [facility] family medicine clinic.  Last visit was with Dr. Zachery Dauer on 03/20/13 indicates he was aware that she was considering lumbar surgery.  EKG on 04/09/13 showed NSR, low voltage QRS, inferior infarct (age undetermined), cannot rule out anterior infarct (age undetermined).  She has two prior EKGs in [place].  Reverse r wave progression in new in V2-3, but otherwise not significantly changed since 02/11/04.  No CV symptoms were documented from her PAT visit.  She has no documented history of MI, CHF, or DM.  Preoperative chest x-ray and labs noted.  She will be further evaluated by her assigned anesthesiologist on the day of surgery.  If she remains asymptomatic from a CV standpoint then I would anticipate that she could proceed as planned.  Velna [facility]/Anesthesiology Phone [phone] 04/10/2013 9:56 AM

## 2013-04-11 MED ORDER — CEFAZOLIN SODIUM-DEXTROSE 2-3 GM-% IV SOLR
2.0000 g | INTRAVENOUS | Status: AC
  Administered 2013-04-12: 2 g via INTRAVENOUS
  Filled 2013-04-11: qty 50

## 2013-04-11 MED ORDER — BUPIVACAINE-EPINEPHRINE PF 0.25-1:200000 % IJ SOLN
INTRAMUSCULAR | Status: AC
  Filled 2013-04-11: qty 30

## 2013-04-12 ENCOUNTER — Encounter (HOSPITAL_COMMUNITY): Payer: Self-pay | Admitting: Vascular Surgery

## 2013-04-12 ENCOUNTER — Encounter (HOSPITAL_COMMUNITY)
Admission: RE | Disposition: A | Discharge status: Home / Self Care | Payer: Self-pay | Source: Ambulatory Visit | Attending: Neurosurgery

## 2013-04-12 ENCOUNTER — Inpatient Hospital Stay (HOSPITAL_COMMUNITY): Payer: Medicare Other

## 2013-04-12 ENCOUNTER — Inpatient Hospital Stay (HOSPITAL_COMMUNITY): Payer: Medicare Other | Admitting: Certified Registered"

## 2013-04-12 ENCOUNTER — Encounter (HOSPITAL_COMMUNITY): Payer: Self-pay | Admitting: *Deleted

## 2013-04-12 ENCOUNTER — Inpatient Hospital Stay (HOSPITAL_COMMUNITY)
Admission: RE | Admit: 2013-04-12 | Discharge: 2013-04-13 | DRG: 460 | Disposition: A | Discharge status: Home / Self Care | Payer: Medicare Other | Source: Ambulatory Visit | Attending: Neurosurgery | Admitting: Neurosurgery

## 2013-04-12 DIAGNOSIS — E785 Hyperlipidemia, unspecified: Secondary | ICD-10-CM | POA: Diagnosis present

## 2013-04-12 DIAGNOSIS — Z01810 Encounter for preprocedural cardiovascular examination: Secondary | ICD-10-CM

## 2013-04-12 DIAGNOSIS — I872 Venous insufficiency (chronic) (peripheral): Secondary | ICD-10-CM | POA: Diagnosis present

## 2013-04-12 DIAGNOSIS — N183 Chronic kidney disease, stage 3 unspecified: Secondary | ICD-10-CM | POA: Diagnosis present

## 2013-04-12 DIAGNOSIS — Z01818 Encounter for other preprocedural examination: Secondary | ICD-10-CM

## 2013-04-12 DIAGNOSIS — Z01812 Encounter for preprocedural laboratory examination: Secondary | ICD-10-CM

## 2013-04-12 DIAGNOSIS — Z981 Arthrodesis status: Secondary | ICD-10-CM

## 2013-04-12 DIAGNOSIS — I129 Hypertensive chronic kidney disease with stage 1 through stage 4 chronic kidney disease, or unspecified chronic kidney disease: Secondary | ICD-10-CM | POA: Diagnosis present

## 2013-04-12 DIAGNOSIS — M48062 Spinal stenosis, lumbar region with neurogenic claudication: Principal | ICD-10-CM | POA: Diagnosis present

## 2013-04-12 SURGERY — POSTERIOR LUMBAR FUSION 1 LEVEL
Anesthesia: General | Site: Back | Wound class: Clean

## 2013-04-12 MED ORDER — PHENOL 1.4 % MT LIQD: 1.0000 | OROMUCOSAL | Status: DC | PRN

## 2013-04-12 MED ORDER — ONDANSETRON HCL 4 MG/2ML IJ SOLN: 4.0000 mg | Freq: Once | INTRAMUSCULAR | Status: DC | PRN

## 2013-04-12 MED ORDER — HYDROCODONE-ACETAMINOPHEN 5-325 MG PO TABS
1.0000 | ORAL_TABLET | ORAL | Status: DC | PRN
  Administered 2013-04-12: 1 via ORAL
  Filled 2013-04-12: qty 1

## 2013-04-12 MED ORDER — CYCLOBENZAPRINE HCL 10 MG PO TABS
ORAL_TABLET | ORAL | Status: AC
  Filled 2013-04-12: qty 1

## 2013-04-12 MED ORDER — ACETAMINOPHEN 325 MG PO TABS: 650.0000 mg | ORAL_TABLET | ORAL | Status: DC | PRN

## 2013-04-12 MED ORDER — ONDANSETRON HCL 4 MG/2ML IJ SOLN: 4.0000 mg | Freq: Four times a day (QID) | INTRAMUSCULAR | Status: DC | PRN

## 2013-04-12 MED ORDER — HYDROMORPHONE HCL PF 1 MG/ML IJ SOLN
INTRAMUSCULAR | Status: AC
  Filled 2013-04-12: qty 1

## 2013-04-12 MED ORDER — VECURONIUM BROMIDE 10 MG IV SOLR
INTRAVENOUS | Status: DC | PRN
  Administered 2013-04-12 (×2): 3 mg via INTRAVENOUS

## 2013-04-12 MED ORDER — MAGNESIUM HYDROXIDE 400 MG/5ML PO SUSP: 30.0000 mL | Freq: Every day | ORAL | Status: DC | PRN

## 2013-04-12 MED ORDER — GLYCOPYRROLATE 0.2 MG/ML IJ SOLN
INTRAMUSCULAR | Status: DC | PRN
  Administered 2013-04-12: 0.4 mg via INTRAVENOUS

## 2013-04-12 MED ORDER — KETOROLAC TROMETHAMINE 30 MG/ML IJ SOLN
INTRAMUSCULAR | Status: AC
  Filled 2013-04-12: qty 1

## 2013-04-12 MED ORDER — ROCURONIUM BROMIDE 100 MG/10ML IV SOLN
INTRAVENOUS | Status: DC | PRN
  Administered 2013-04-12: 50 mg via INTRAVENOUS
  Administered 2013-04-12 (×3): 10 mg via INTRAVENOUS

## 2013-04-12 MED ORDER — HYDROXYZINE HCL 25 MG PO TABS: 50.0000 mg | ORAL_TABLET | ORAL | Status: DC | PRN

## 2013-04-12 MED ORDER — THROMBIN 20000 UNITS EX SOLR
CUTANEOUS | Status: DC | PRN
  Administered 2013-04-12: 12:00:00 via TOPICAL

## 2013-04-12 MED ORDER — OXYCODONE-ACETAMINOPHEN 5-325 MG PO TABS: 1.0000 | ORAL_TABLET | ORAL | Status: DC | PRN

## 2013-04-12 MED ORDER — HYDROCHLOROTHIAZIDE 12.5 MG PO CAPS
12.5000 mg | ORAL_CAPSULE | Freq: Every day | ORAL | Status: DC
  Administered 2013-04-13: 12.5 mg via ORAL
  Filled 2013-04-12 (×2): qty 1

## 2013-04-12 MED ORDER — SODIUM CHLORIDE 0.9 % IR SOLN
Status: DC | PRN
  Administered 2013-04-12: 12:00:00

## 2013-04-12 MED ORDER — LACTATED RINGERS IV SOLN
INTRAVENOUS | Status: DC
  Administered 2013-04-12 (×2): via INTRAVENOUS

## 2013-04-12 MED ORDER — BISACODYL 10 MG RE SUPP: 10.0000 mg | Freq: Every day | RECTAL | Status: DC | PRN

## 2013-04-12 MED ORDER — FENTANYL CITRATE 0.05 MG/ML IJ SOLN
INTRAMUSCULAR | Status: DC | PRN
  Administered 2013-04-12: 50 ug via INTRAVENOUS
  Administered 2013-04-12: 100 ug via INTRAVENOUS

## 2013-04-12 MED ORDER — SIMVASTATIN 20 MG PO TABS
20.0000 mg | ORAL_TABLET | Freq: Every evening | ORAL | Status: DC
  Filled 2013-04-12 (×2): qty 1

## 2013-04-12 MED ORDER — ONDANSETRON HCL 4 MG/2ML IJ SOLN
INTRAMUSCULAR | Status: DC | PRN
  Administered 2013-04-12: 4 mg via INTRAVENOUS

## 2013-04-12 MED ORDER — MORPHINE SULFATE 4 MG/ML IJ SOLN: 4.0000 mg | INTRAMUSCULAR | Status: DC | PRN

## 2013-04-12 MED ORDER — NEOSTIGMINE METHYLSULFATE 1 MG/ML IJ SOLN
INTRAMUSCULAR | Status: DC | PRN
  Administered 2013-04-12: 3 mg via INTRAVENOUS

## 2013-04-12 MED ORDER — PHENYLEPHRINE HCL 10 MG/ML IJ SOLN
10.0000 mg | INTRAVENOUS | Status: DC | PRN
  Administered 2013-04-12: 20 ug/min via INTRAVENOUS

## 2013-04-12 MED ORDER — ONDANSETRON HCL 4 MG/2ML IJ SOLN
INTRAMUSCULAR | Status: AC
  Administered 2013-04-12: 4 mg
  Filled 2013-04-12: qty 2

## 2013-04-12 MED ORDER — PHENYLEPHRINE HCL 10 MG/ML IJ SOLN
INTRAMUSCULAR | Status: DC | PRN
  Administered 2013-04-12: 80 ug via INTRAVENOUS
  Administered 2013-04-12 (×2): 120 ug via INTRAVENOUS
  Administered 2013-04-12: 80 ug via INTRAVENOUS

## 2013-04-12 MED ORDER — KETOROLAC TROMETHAMINE 30 MG/ML IJ SOLN
30.0000 mg | Freq: Four times a day (QID) | INTRAMUSCULAR | Status: DC
  Administered 2013-04-12 – 2013-04-13 (×3): 30 mg via INTRAVENOUS
  Filled 2013-04-12 (×6): qty 1

## 2013-04-12 MED ORDER — ARTIFICIAL TEARS OP OINT
TOPICAL_OINTMENT | OPHTHALMIC | Status: DC | PRN
  Administered 2013-04-12: 1 via OPHTHALMIC

## 2013-04-12 MED ORDER — PROPOFOL 10 MG/ML IV BOLUS
INTRAVENOUS | Status: DC | PRN
  Administered 2013-04-12: 190 mg via INTRAVENOUS

## 2013-04-12 MED ORDER — ZOLPIDEM TARTRATE 5 MG PO TABS: 5.0000 mg | ORAL_TABLET | Freq: Every evening | ORAL | Status: DC | PRN

## 2013-04-12 MED ORDER — HYDROMORPHONE HCL PF 1 MG/ML IJ SOLN
0.2500 mg | INTRAMUSCULAR | Status: DC | PRN
  Administered 2013-04-12 (×2): 0.5 mg via INTRAVENOUS

## 2013-04-12 MED ORDER — BUPIVACAINE HCL (PF) 0.5 % IJ SOLN
INTRAMUSCULAR | Status: DC | PRN
  Administered 2013-04-12: 10 mL

## 2013-04-12 MED ORDER — SODIUM CHLORIDE 0.9 % IJ SOLN: 3.0000 mL | INTRAMUSCULAR | Status: DC | PRN

## 2013-04-12 MED ORDER — KETOROLAC TROMETHAMINE 30 MG/ML IJ SOLN
30.0000 mg | Freq: Once | INTRAMUSCULAR | Status: AC
  Administered 2013-04-12: 30 mg via INTRAVENOUS

## 2013-04-12 MED ORDER — MENTHOL 3 MG MT LOZG: 1.0000 | LOZENGE | OROMUCOSAL | Status: DC | PRN

## 2013-04-12 MED ORDER — CEFAZOLIN SODIUM 1-5 GM-% IV SOLN
INTRAVENOUS | Status: AC
  Administered 2013-04-12: 1 g via INTRAVENOUS
  Filled 2013-04-12: qty 50

## 2013-04-12 MED ORDER — ACETAMINOPHEN 650 MG RE SUPP: 650.0000 mg | RECTAL | Status: DC | PRN

## 2013-04-12 MED ORDER — ENALAPRIL MALEATE 10 MG PO TABS
10.0000 mg | ORAL_TABLET | Freq: Every day | ORAL | Status: DC
  Administered 2013-04-13: 10 mg via ORAL
  Filled 2013-04-12 (×2): qty 1

## 2013-04-12 MED ORDER — 0.9 % SODIUM CHLORIDE (POUR BTL) OPTIME
TOPICAL | Status: DC | PRN
  Administered 2013-04-12: 1000 mL

## 2013-04-12 MED ORDER — LIDOCAINE-EPINEPHRINE 1 %-1:100000 IJ SOLN
INTRAMUSCULAR | Status: DC | PRN
  Administered 2013-04-12: 10 mL

## 2013-04-12 MED ORDER — ALUM & MAG HYDROXIDE-SIMETH 200-200-20 MG/5ML PO SUSP: 30.0000 mL | Freq: Four times a day (QID) | ORAL | Status: DC | PRN

## 2013-04-12 MED ORDER — CYCLOBENZAPRINE HCL 10 MG PO TABS
10.0000 mg | ORAL_TABLET | Freq: Three times a day (TID) | ORAL | Status: DC | PRN
  Administered 2013-04-12: 10 mg via ORAL
  Filled 2013-04-12: qty 1

## 2013-04-12 MED ORDER — KCL IN DEXTROSE-NACL 20-5-0.45 MEQ/L-%-% IV SOLN
INTRAVENOUS | Status: DC
  Filled 2013-04-12 (×4): qty 1000

## 2013-04-12 MED ORDER — ACETAMINOPHEN 10 MG/ML IV SOLN
INTRAVENOUS | Status: AC
  Administered 2013-04-12: 1000 mg via INTRAVENOUS
  Filled 2013-04-12: qty 100

## 2013-04-12 MED ORDER — SODIUM CHLORIDE 0.9 % IJ SOLN: 3.0000 mL | Freq: Two times a day (BID) | INTRAMUSCULAR | Status: DC

## 2013-04-12 MED ORDER — LIDOCAINE HCL (CARDIAC) 20 MG/ML IV SOLN
INTRAVENOUS | Status: DC | PRN
  Administered 2013-04-12: 70 mg via INTRAVENOUS

## 2013-04-12 SURGICAL SUPPLY — 76 items
BAG DECANTER FOR FLEXI CONT (MISCELLANEOUS) ×2 IMPLANT
BLADE SURG ROTATE 9660 (MISCELLANEOUS) IMPLANT
BRUSH SCRUB EZ PLAIN DRY (MISCELLANEOUS) ×2 IMPLANT
BUR ACRON 5.0MM COATED (BURR) ×2 IMPLANT
BUR MATCHSTICK NEURO 3.0 LAGG (BURR) ×2 IMPLANT
CANISTER SUCTION 2500CC (MISCELLANEOUS) ×2 IMPLANT
CAP LCK SPNE (Orthopedic Implant) ×4 IMPLANT
CAP LOCK SPINE RADIUS (Orthopedic Implant) ×4 IMPLANT
CAP LOCKING (Orthopedic Implant) ×4 IMPLANT
CLOTH BEACON ORANGE TIMEOUT ST (SAFETY) ×2 IMPLANT
CONT SPEC 4OZ CLIKSEAL STRL BL (MISCELLANEOUS) ×6 IMPLANT
COVER BACK TABLE 24X17X13 BIG (DRAPES) IMPLANT
COVER TABLE BACK 60X90 (DRAPES) ×2 IMPLANT
DERMABOND ADHESIVE PROPEN (GAUZE/BANDAGES/DRESSINGS) ×2
DERMABOND ADVANCED (GAUZE/BANDAGES/DRESSINGS) ×2
DERMABOND ADVANCED .7 DNX12 (GAUZE/BANDAGES/DRESSINGS) ×2 IMPLANT
DERMABOND ADVANCED .7 DNX6 (GAUZE/BANDAGES/DRESSINGS) ×2 IMPLANT
DRAPE C-ARM 42X72 X-RAY (DRAPES) ×4 IMPLANT
DRAPE LAPAROTOMY 100X72X124 (DRAPES) ×2 IMPLANT
DRAPE POUCH INSTRU U-SHP 10X18 (DRAPES) ×2 IMPLANT
DRAPE PROXIMA HALF (DRAPES) IMPLANT
DRSG EMULSION OIL 3X3 NADH (GAUZE/BANDAGES/DRESSINGS) IMPLANT
DURAFORM SPONGE 2X2 SINGLE (Neuro Prosthesis/Implant) ×2 IMPLANT
ELECT REM PT RETURN 9FT ADLT (ELECTROSURGICAL) ×2
ELECTRODE REM PT RTRN 9FT ADLT (ELECTROSURGICAL) ×1 IMPLANT
GAUZE SPONGE 4X4 16PLY XRAY LF (GAUZE/BANDAGES/DRESSINGS) IMPLANT
GLOVE BIOGEL PI IND STRL 8 (GLOVE) ×2 IMPLANT
GLOVE BIOGEL PI INDICATOR 8 (GLOVE) ×2
GLOVE ECLIPSE 7.5 STRL STRAW (GLOVE) ×8 IMPLANT
GLOVE ECLIPSE 8.5 STRL (GLOVE) ×6 IMPLANT
GLOVE EXAM NITRILE LRG STRL (GLOVE) IMPLANT
GLOVE EXAM NITRILE MD LF STRL (GLOVE) IMPLANT
GLOVE EXAM NITRILE XL STR (GLOVE) IMPLANT
GLOVE EXAM NITRILE XS STR PU (GLOVE) IMPLANT
GLOVE INDICATOR 7.5 STRL GRN (GLOVE) ×2 IMPLANT
GLOVE INDICATOR 8.5 STRL (GLOVE) ×4 IMPLANT
GLOVE SURG SS PI 8.0 STRL IVOR (GLOVE) ×8 IMPLANT
GOWN BRE IMP SLV AUR LG STRL (GOWN DISPOSABLE) ×4 IMPLANT
GOWN BRE IMP SLV AUR XL STRL (GOWN DISPOSABLE) ×4 IMPLANT
GOWN STRL REIN 2XL LVL4 (GOWN DISPOSABLE) ×4 IMPLANT
KIT BASIN OR (CUSTOM PROCEDURE TRAY) ×2 IMPLANT
KIT INFUSE X SMALL 1.4CC (Orthopedic Implant) ×2 IMPLANT
KIT ROOM TURNOVER OR (KITS) ×2 IMPLANT
MILL MEDIUM DISP (BLADE) ×2 IMPLANT
NEEDLE BONE MARROW 8GAX6 (NEEDLE) ×2 IMPLANT
NEEDLE SPNL 18GX3.5 QUINCKE PK (NEEDLE) IMPLANT
NEEDLE SPNL 22GX3.5 QUINCKE BK (NEEDLE) ×4 IMPLANT
NS IRRIG 1000ML POUR BTL (IV SOLUTION) ×2 IMPLANT
PACK LAMINECTOMY NEURO (CUSTOM PROCEDURE TRAY) ×2 IMPLANT
PAD ARMBOARD 7.5X6 YLW CONV (MISCELLANEOUS) ×6 IMPLANT
PATTIES SURGICAL .5 X.5 (GAUZE/BANDAGES/DRESSINGS) IMPLANT
PATTIES SURGICAL .5 X1 (DISPOSABLE) IMPLANT
PATTIES SURGICAL 1X1 (DISPOSABLE) IMPLANT
PEEK PLIF AVS 11X25X4 (Peek) ×4 IMPLANT
ROD RADIUS 35MM (Rod) ×2 IMPLANT
ROD RADIUS 40MM (Neuro Prosthesis/Implant) ×1 IMPLANT
ROD SPNL 40X5.5XNS TI RDS (Neuro Prosthesis/Implant) ×1 IMPLANT
SCREW 5.75X40M (Screw) ×2 IMPLANT
SCREW 5.75X45MM (Screw) ×6 IMPLANT
SPONGE GAUZE 4X4 12PLY (GAUZE/BANDAGES/DRESSINGS) ×2 IMPLANT
SPONGE LAP 4X18 X RAY DECT (DISPOSABLE) IMPLANT
SPONGE NEURO XRAY DETECT 1X3 (DISPOSABLE) IMPLANT
SPONGE SURGIFOAM ABS GEL 100 (HEMOSTASIS) ×2 IMPLANT
STRIP BIOACTIVE VITOSS 25X100X (Neuro Prosthesis/Implant) ×2 IMPLANT
STRIP BIOACTIVE VITOSS 25X52X4 (Orthopedic Implant) ×2 IMPLANT
SUT VIC AB 1 CT1 18XBRD ANBCTR (SUTURE) ×2 IMPLANT
SUT VIC AB 1 CT1 8-18 (SUTURE) ×2
SUT VIC AB 2-0 CP2 18 (SUTURE) ×4 IMPLANT
SYR 20ML ECCENTRIC (SYRINGE) ×2 IMPLANT
SYR 3ML LL SCALE MARK (SYRINGE) ×8 IMPLANT
SYR CONTROL 10ML LL (SYRINGE) ×4 IMPLANT
TAPE CLOTH SURG 4X10 WHT LF (GAUZE/BANDAGES/DRESSINGS) ×2 IMPLANT
TOWEL OR 17X24 6PK STRL BLUE (TOWEL DISPOSABLE) ×2 IMPLANT
TOWEL OR 17X26 10 PK STRL BLUE (TOWEL DISPOSABLE) ×2 IMPLANT
TRAY FOLEY CATH 14FRSI W/METER (CATHETERS) ×2 IMPLANT
WATER STERILE IRR 1000ML POUR (IV SOLUTION) ×2 IMPLANT

## 2013-04-12 NOTE — Transfer of Care (Signed)
Immediate Anesthesia Transfer of Care Note  Patient: Amanda Shaw  Procedure(s) Performed: Procedure(s) with comments: Lumbar Three-Four decompression with posterior lumbar interbody fusion with interbody prothesis posterior lateral arthrodesis and posterior nonsegmental instrumentation (N/A) - Lumbar Three-Four decompression with posterior lumbar interbody fusion with interbody prothesis posterior lateral arthrodesis and posterior nonsegmental instrumentationPOSTERIOR LUMBAR FUSION 1 LEVEL  Patient Location: PACU  Anesthesia Type:General  Level of Consciousness: lethargic and responds to stimulation  Airway & Oxygen Therapy: Patient Spontanous Breathing and Patient connected to nasal cannula oxygen  Post-op Assessment: Report given to PACU RN and Patient moving all extremities X 4  Post vital signs: Reviewed and stable  Complications: No apparent anesthesia complications

## 2013-04-12 NOTE — Preoperative (Signed)
Beta Blockers   Reason not to administer Beta Blockers:Not Applicable 

## 2013-04-12 NOTE — H&P (Signed)
Subjective: Patient is a 79 y.o. female who is admitted for treatment of low back and left lumbar radicular pain, radiating down into the left buttock, lateral left hip and thigh. She status post previous left L4-5 lumbar laminotomy and microdiscectomy, status post right L5-S1 lumbar laminotomy and discectomy, and status post a previous right L5-S1 lumbar microdiscectomy. The surgeons returned in 1998, 1973, and 1976 respectively. Symptoms began in July, she did not respond to medications. Patient was evaluated with x-rays, myelogram, post milligrams CT scan. These show a grade 1 dynamic degenerative spinal listhesis of L3 on L4. There is multilevel degenerative disc disease and spondylosis. We see moderate stenosis at L1-2 and L2-3, but severe stenosis at L3-4, worse on the left than the right side. There is vacuum disc phenomena at the L3-4 level. There is probable superimposed disc herniation on the left at L3-4. Patient is admitted now for a bilateral L3-4 lumbar decompression, including laminectomy, facetectomy, and foraminotomy, bilateral L3-4 posterior lumbar interbody arthrodesis with interbody implants and bone graft, and a bilateral L3-4 posterior arthrodesis with posterior instrumentation and bone graft.    Patient Active Problem List   Diagnosis Date Noted  . Frequency of urination and polyuria 03/20/2013  . Other bone involvement in diseases classified elsewhere 06/08/2012  . Spinal stenosis of lumbar region 06/08/2012  . Osteoarthritis of hip 01/29/2011  . CHRONIC KIDNEY DISEASE STAGE III (MODERATE) 06/15/2010  . IMPAIRED GLUCOSE TOLERANCE 06/11/2010  . VENOUS INSUFFICIENCY, CHRONIC 07/22/2009  . HYPERLIPIDEMIA 09/29/2006  . Overweight 09/29/2006  . HYPERTENSION, BENIGN SYSTEMIC 09/29/2006  . HEMORRHOIDS, NOS 09/29/2006  . FIBROADENOSIS, BREAST 09/29/2006  . CYSTOCELE/RECTOCELE/PROLAPSE,UNSPEC. 09/29/2006  . GLAUCOMA, HX OF 09/29/2006   Past Medical History  Diagnosis Date  .  Fracture of ankle, left, closed 10/1998  . Gastritis 07/2006    by endoscopy  . Hypertension   . Arthritis     arthritis in hips     Past Surgical History  Procedure Laterality Date  . Tonsillectomy  1956  . Cholecystectomy  1987  . Trabeculectomy Bilateral     laser  . Vaginal hysterectomy  1988    benign disease  . Lumbar disc surgery  [id number]  . Bladder suspension  01/2004  . Cryoablation      bridge of nose, Dr Yetta Barre  . Spider vein injection Bilateral 2010  . Excision/release bursa hip  09/22/2011    Procedure: EXCISION/RELEASE BURSA HIP;  Surgeon: Loanne Drilling, MD;  Location: [facility];  Service: Orthopedics;  Laterality: Right;  right hip bursectomy   . Back surgery    . Appendectomy      No prescriptions prior to admission   No Known Allergies  History  Substance Use Topics  . Smoking status: Never Smoker   . Smokeless tobacco: Never Used  . Alcohol Use: No    Family History  Problem Relation Age of Onset  . Heart disease Mother 41  . Anuerysm Father 12  . Heart disease Brother 7    Dementia     Review of Systems A comprehensive review of systems was negative.  Objective: Vital signs in last 24 hours:    EXAM: Patient well-developed well-nourished white female in no acute distress. Lungs are clear to auscultation , the patient has symmetrical respiratory excursion. Heart has a regular rate and rhythm normal S1 and S2 no murmur.   Abdomen is soft nontender nondistended bowel sounds are present. Extremity examination shows no clubbing cyanosis or edema. Musculoskeletal examination shows tenderness to  palpation in the lower lumbar region. She is able to flex to 90 but is able to extend only up to 0. She stands with a flexed forward posture. Straight leg raising is negative bilaterally. She has negative Luisa Hart sign bilaterally. There is mild tenderness over the left greater trochanter, no tenderness over the right greater trochanter. Motor examination  shows 5 over 5 strength in the lower extremities including the iliopsoas quadriceps dorsiflexor extensor hallicus  longus and plantar flexor bilaterally. Sensation is intact to pinprick in the distal lower extremities. Reflexes are symmetrical bilaterally. No pathologic reflexes are present. Patient has a normal gait and stance.   Data Review:CBC    Component Value Date/Time   WBC 5.9 04/09/2013 0937   RBC 4.84 04/09/2013 0937   HGB 15.7* 04/09/2013 0937   HCT 45.0 04/09/2013 0937   PLT 207 04/09/2013 0937   MCV 93.0 04/09/2013 0937   MCH 32.4 04/09/2013 0937   MCHC 34.9 04/09/2013 0937   RDW 12.4 04/09/2013 0937   LYMPHSABS 2.2 11/17/2009 1945   MONOABS 0.4 11/17/2009 1945   EOSABS 0.3 11/17/2009 1945   BASOSABS 0.0 11/17/2009 1945                          BMET    Component Value Date/Time   NA 138 04/09/2013 0937   K 3.9 04/09/2013 0937   CL 99 04/09/2013 0937   CO2 30 04/09/2013 0937   GLUCOSE 102* 04/09/2013 0937   BUN 17 04/09/2013 0937   CREATININE 0.84 04/09/2013 0937   CREATININE 0.87 03/20/2013 1521   CALCIUM 10.2 04/09/2013 0937   GFRNONAA 64* 04/09/2013 0937   GFRAA 74* 04/09/2013 0937     Assessment/Plan: Patient with low back and left lumbar radicular pain who has severe multifactorial lumbar stenosis at the L3-4 level, worse on the left than the right. She does have degenerative changes at adjacent levels in the lumbar spine, but not nearly as severe. She is admitted now for an L3 for lumbar decompression and arthrodesis. I've discussed with the patient the nature of his condition, the nature the surgical procedure, the typical length of surgery, hospital stay, and overall recuperation, the limitations postoperatively, and risks of surgery. I discussed risks including risks of infection, bleeding, possibly need for transfusion, the risk of nerve root dysfunction with pain, weakness, numbness, or paresthesias, the risk of dural tear and CSF leakage and possible need for further surgery, the risk of failure  of the arthrodesis and possibly for further surgery, the risk of anesthetic complications including myocardial infarction, stroke, pneumonia, and death. We discussed the need for postoperative immobilization in a lumbar brace. Understanding all this the patient does wish to proceed with surgery and is admitted for such.  Hewitt Shorts, MD 04/12/2013 7:23 AM

## 2013-04-12 NOTE — Anesthesia Procedure Notes (Signed)
Procedure Name: Intubation Date/Time: 04/12/2013 10:55 AM Performed by: De Nurse Pre-anesthesia Checklist: Patient identified, Emergency Drugs available, Suction available, Patient being monitored and Timeout performed Patient Re-evaluated:Patient Re-evaluated prior to inductionOxygen Delivery Method: Circle system utilized Preoxygenation: Pre-oxygenation with 100% oxygen Intubation Type: IV induction Ventilation: Mask ventilation without difficulty Laryngoscope Size: Mac and 3 Grade View: Grade I Tube type: Oral Number of attempts: 1 Airway Equipment and Method: Stylet Placement Confirmation: ETT inserted through vocal cords under direct vision,  positive ETCO2 and breath sounds checked- equal and bilateral Secured at: 20 cm Tube secured with: Tape Dental Injury: Teeth and Oropharynx as per pre-operative assessment

## 2013-04-12 NOTE — Anesthesia Preprocedure Evaluation (Addendum)
Anesthesia Evaluation  Patient identified by MRN, date of birth, ID band Patient awake    Reviewed: Allergy & Precautions, H&P , NPO status , Patient's Chart, lab work & pertinent test results  Airway Mallampati: II TM Distance: >3 FB Neck ROM: full    Dental  (+) Edentulous Lower, Teeth Intact and Dental Advisory Given   Pulmonary          Cardiovascular hypertension, + Peripheral Vascular Disease Rhythm:regular Rate:Normal     Neuro/Psych    GI/Hepatic   Endo/Other    Renal/GU CRFRenal disease     Musculoskeletal   Abdominal   Peds  Hematology   Anesthesia Other Findings   Reproductive/Obstetrics                          Anesthesia Physical Anesthesia Plan  ASA: II  Anesthesia Plan: General   Post-op Pain Management:    Induction: Intravenous  Airway Management Planned: Oral ETT  Additional Equipment:   Intra-op Plan:   Post-operative Plan: Extubation in OR  Informed Consent: I have reviewed the patients History and Physical, chart, labs and discussed the procedure including the risks, benefits and alternatives for the proposed anesthesia with the patient or authorized representative who has indicated his/her understanding and acceptance.     Plan Discussed with: CRNA, Anesthesiologist and Surgeon  Anesthesia Plan Comments:         Anesthesia Quick Evaluation

## 2013-04-12 NOTE — Anesthesia Postprocedure Evaluation (Signed)
  Anesthesia Post-op Note  Patient: Amanda Shaw  Procedure(s) Performed: Procedure(s) with comments: Lumbar Three-Four decompression with posterior lumbar interbody fusion with interbody prothesis posterior lateral arthrodesis and posterior nonsegmental instrumentation (N/A) - Lumbar Three-Four decompression with posterior lumbar interbody fusion with interbody prothesis posterior lateral arthrodesis and posterior nonsegmental instrumentationPOSTERIOR LUMBAR FUSION 1 LEVEL  Patient Location: PACU  Anesthesia Type:General  Level of Consciousness: awake, oriented, sedated and patient cooperative  Airway and Oxygen Therapy: Patient Spontanous Breathing  Post-op Pain: mild  Post-op Assessment: Post-op Vital signs reviewed, Patient's Cardiovascular Status Stable, Respiratory Function Stable, Patent Airway, No signs of Nausea or vomiting and Pain level controlled  Post-op Vital Signs: stable  Complications: No apparent anesthesia complications

## 2013-04-12 NOTE — Progress Notes (Signed)
Filed Vitals:   04/12/13 1515 04/12/13 1525 04/12/13 1537 04/12/13 1608  BP:  113/47 120/52 119/71  Pulse: 65 64  74  Temp:   96.8 F (36 C) 97.6 F (36.4 C)  TempSrc:      Resp: 21 16  16   SpO2: 100% 96% 100% 92%    Patient resting in bed, still mildly groggy from anesthesia. Foley to straight drainage. Dressing clean dry. Moving all extremities well.  Plan: Progress through postoperative recovery.  Hewitt Shorts, MD 04/12/2013, 5:07 PM

## 2013-04-12 NOTE — Op Note (Signed)
04/12/2013  2:53 PM  PATIENT:  Amanda Shaw  79 y.o. female  PRE-OPERATIVE DIAGNOSIS:  L3-4 lumbar stenosis with neurogenic claudication, grade 1 dynamic degenerative  spondylolisthesis, lumbar herniated disc, lumbar spondylosis, lumbar degenerative disc disease  POST-OPERATIVE DIAGNOSIS:  L3-4 lumbar stenosis with neurogenic claudication, grade 1 dynamic degenerative  spondylolisthesis, lumbar herniated disc, lumbar spondylosis, lumbar degenerative disc disease  PROCEDURE:  Procedure(s): Lumbar Three-Four decompression with posterior lumbar interbody fusion with interbody prothesis posterior lateral arthrodesis and posterior nonsegmental instrumentation: Bilateral L3-4 lumbar decompression including laminectomy, facetectomy, and foraminotomies, with decompression of the central canal stenosis and the stenotic compression of the exiting L3 and L4 nerve roots bilaterally, with decompression beyond that required for interbody arthrodesis; bilateral L3-4 posterior lumbar interbody arthrodesis with interbody implants, Vitoss BA with bone marrow aspirate, and infuse; bilateral L3-4 posterolateral arthrodesis with nonsegmental radius posterior instrumentation, locally harvested morcellized autograft, Vitoss BA with bone marrow aspirate, and infuse  SURGEON:  Surgeon(s): Hewitt Shorts, MD Clydene Fake, MD  ASSISTANTS:  Clydene Fake, MD  ANESTHESIA:   general  EBL:  Total I/O In: 1500 [I.V.:1500] Out: 515 [Urine:265; Blood:250]  BLOOD ADMINISTERED:none  CELL SAVER GIVEN:  Cell saver technician felt that there was insufficient blood loss to process the collected blood  COUNT: Correct per nursing staff  DICTATION: Patient is brought to the operating room placed under general endotracheal anesthesia. The patient was turned to prone position the lumbar region was prepped with Betadine soap and solution and draped in a sterile fashion. The midline was infiltrated with local  anesthesia with epinephrine. A localizing x-ray was taken and then a midline incision was made carried down through the subcutaneous tissue, bipolar cautery and electrocautery were used to maintain hemostasis. Dissection was carried down to the lumbar fascia. The fascia was incised bilaterally and the paraspinal muscles were dissected with a spinous process and lamina in a subperiosteal fashion. Another x-ray was taken for localization and the L3-4 level was localized. Dissection was then carried out laterally over the facet complex and the transverse processes of L3 and L4 were exposed and decorticated. We proceeded with the decompression by performing an L3 and L4 lumbar laminectomy using double-action rongeurs, the high-speed drill, and Kerrison punches. The ligamentum flavum was markedly thickened and was carefully removed, decompressing the thecal sac and central canal stenosis. Decompression was carried out laterally including bilateral facetectomy and foraminotomies for the exiting L3 and L4 nerve roots with decompression of the stenotic compression of the L3 and L4 nerve roots. Once the decompression stenotic compression of the thecal sac and exiting nerve roots was completed we proceeded with the posterior lumbar interbody arthrodesis. The annulus was incised bilaterally and the disc space entered. A thorough discectomy was performed using pituitary rongeurs and curettes. Once the discectomy was completed we began to prepare the endplate surfaces removing the cartilaginous endplates surface. We then measured the height of the intervertebral disc space. We selected 11 x 25 x 4 AVS peek interbody implants.  The C-arm fluoroscope was then draped and brought in the field and we identified the pedicle entry points bilaterally at the L3-L4 levels. Each of the 4 pedicles was probed, we aspirated bone marrow aspirate from the vertebral bodies, this was injected over a 10 cc and 5 cc strips of Vitoss BA. Then  each of the pedicles was examined with the ball probe good bony surfaces were found and no bony cuts were found. Each of the pedicles was then tapped with  a 5.25 mm tap, again examined with the ball probe good threading was found and no bony cuts were found. We then placed 5.75 by 45 millimeter screws bilaterally at the L3 level and on the left side at L4. We placed a 5.75 x 40 mm screw on the right side at L4.  We then packed the AVS peek interbody implants with Vitoss BA with bone marrow aspirate and infuse, and then placed the first implant and on the right side, carefully retracting the thecal sac and nerve root medially. We then went back to the left side and packed the midline with additional Vitoss BA with bone marrow aspirate infuse, and then placed a second implant and on the left side again retracting the thecal sac and nerve root medially. Additional Vitoss BA with bone marrow aspirate and infuse was packed lateral to the implants.  We then packed the lateral gutter over the transverse processes and intertransverse space with locally harvested morcellized autograft, Vitoss BA with bone marrow aspirate, and infuse. We then selected pre-lordosed rods, using a 40 mm rod on the left and a 35 mm rod on the right, they were placed within the screw heads and secured with locking caps once all 4 locking caps were placed final tightening was performed against a counter torque.  The wound had been irrigated multiple times during the procedure with saline solution and bacitracin solution, good hemostasis was established with a combination of bipolar cautery and Gelfoam with thrombin. Once good hemostasis was confirmed we proceeded with closure paraspinal muscles deep fascia and Scarpa's fascia were closed with interrupted undyed 1 Vicryl sutures the subcutaneous and subcuticular closed with interrupted inverted 2-0 undyed Vicryl sutures the skin edges were approximated with Dermabond. The wound was dressed  with sterile gauze and Hypafix.  Following surgery the patient was turned back to the supine position to be reversed and the anesthetic extubated and transferred to the recovery room for further care. In the recovery room the patient was noted be moving all 4 extremities to command.   PLAN OF CARE: Admit for overnight observation  PATIENT DISPOSITION:  PACU - hemodynamically stable.   Delay start of Pharmacological VTE agent (>24hrs) due to surgical blood loss or risk of bleeding:  yes

## 2013-04-12 NOTE — Progress Notes (Signed)
UR COMPLETED  

## 2013-04-13 MED ORDER — HYDROCODONE-ACETAMINOPHEN 5-325 MG PO TABS: 1.0000 | ORAL_TABLET | ORAL | Status: DC | PRN

## 2013-04-13 NOTE — Discharge Summary (Signed)
Physician Discharge Summary  Patient ID: Amanda Shaw MRN: [id number] DOB/AGE: [id number] y.o.  Admit date: 04/12/2013 Discharge date: 04/13/2013  Admission Diagnoses:  L3-4 lumbar stenosis with neurogenic claudication, grade 1 dynamic degenerative spondylolisthesis, lumbar herniated disc, lumbar spondylosis, lumbar degenerative disc disease  Discharge Diagnoses:  L3-4 lumbar stenosis with neurogenic claudication, grade 1 dynamic degenerative spondylolisthesis, lumbar herniated disc, lumbar spondylosis, lumbar degenerative disc disease  Discharged Condition: good  Hospital Course: Patient was admitted underwent an L3-4 lumbar decompression and arthrodesis. She is up and living actively. Her Foley was discontinued and she is voiding. We removed her bandage, and her wound is healing nicely. We're going to let her be discharged later today, and she is to return for followup with me in 3 weeks. We've given instructions regarding wound care and activities following discharge.  Discharge Exam: Blood pressure 115/73, pulse 67, temperature 98.5 F (36.9 C), temperature source Oral, resp. rate 20, SpO2 92.00%.  Disposition: Home     Medication List         BAYER [employer]ILDRENS ASPIRIN 81 MG chewable tablet  Generic drug:  aspirin  Chew 81 mg by mouth every evening.     CALTRATE 600+D PO  Take 1 tablet by mouth daily.     enalapril 10 MG tablet  Commonly known as:  VASOTEC  Take 10 mg by mouth daily.     hydrochlorothiazide 25 MG tablet  Commonly known as:  HYDRODIURIL  Take 12.5 mg by mouth daily.     HYDROcodone-acetaminophen 5-325 MG per tablet  Commonly known as:  NORCO/VICODIN  Take 1-2 tablets by mouth every 4 (four) hours as needed for pain.     simvastatin 20 MG tablet  Commonly known as:  ZOCOR  Take 20 mg by mouth every evening.     TYLENOL 325 MG tablet  Generic drug:  acetaminophen  - Take 650 mg by mouth every 6 (six) hours as needed. Pain  -           Signed: Hewitt Shorts, MD 04/13/2013, 8:37 AM

## 2013-04-13 NOTE — Plan of Care (Signed)
Problem: Consults Goal: Diagnosis - Spinal Surgery Outcome: Completed/Met Date Met:  04/13/13 Thoraco/Lumbar Spine Fusion

## 2013-04-13 NOTE — Progress Notes (Signed)
Pt. Alert and oriented,follows simple instructions, denies pain. Incision area without swelling, redness or S/S of infection. Voiding adequate clear yellow urine. Moving all extremities well and vitals stable and documented. Discharged home with Lumbar surgery notes instructions given to patient and family member for home safety and precautions. Pt. and family stated understanding of instructions given.

## 2013-04-16 MED FILL — Heparin Sodium (Porcine) Inj 1000 Unit/ML: INTRAMUSCULAR | Qty: 30 | Status: AC

## 2013-04-16 MED FILL — Sodium Chloride IV Soln 0.9%: INTRAVENOUS | Qty: 1000 | Status: AC

## 2013-07-13 ENCOUNTER — Other Ambulatory Visit: Payer: Self-pay | Admitting: Family Medicine

## 2013-07-13 MED ORDER — SIMVASTATIN 20 MG PO TABS: 20.0000 mg | ORAL_TABLET | Freq: Every evening | ORAL | Status: DC

## 2013-07-13 MED ORDER — ENALAPRIL MALEATE 10 MG PO TABS: 10.0000 mg | ORAL_TABLET | Freq: Every day | ORAL | Status: DC

## 2013-08-08 ENCOUNTER — Telehealth: Payer: Self-pay | Admitting: Family Medicine

## 2013-08-08 NOTE — Telephone Encounter (Signed)
Will fwd to MD.  [place] L, CMA  

## 2013-08-08 NOTE — Telephone Encounter (Signed)
Please call patient and ask her which medications that she needs refilled, thanks.

## 2013-08-08 NOTE — Telephone Encounter (Signed)
Pt called because now that their insurance has changed they have to get the medication through [facility]. She would like to have Dr. Ree Kida write 3 new prescriptions and fax them to [facility]. [phone]. jw

## 2013-08-08 NOTE — Telephone Encounter (Signed)
Amanda Shaw needs her HCTZ, Enalapril, and Simvastatin sent to [facility].Amanda Shaw, Amanda Shaw

## 2013-08-09 MED ORDER — ENALAPRIL MALEATE 10 MG PO TABS: 10.0000 mg | ORAL_TABLET | Freq: Every day | ORAL | Status: DC

## 2013-08-09 MED ORDER — SIMVASTATIN 20 MG PO TABS: 20.0000 mg | ORAL_TABLET | Freq: Every evening | ORAL | Status: DC

## 2013-08-09 MED ORDER — HYDROCHLOROTHIAZIDE 25 MG PO TABS: 12.5000 mg | ORAL_TABLET | Freq: Every day | ORAL | Status: DC

## 2013-08-09 NOTE — Addendum Note (Signed)
Addended by: Lupita Dawn on: 08/09/2013 01:53 PM   Modules accepted: Orders

## 2013-08-09 NOTE — Telephone Encounter (Signed)
Printed prescriptions for 90 day supply, will fax to [facility]

## 2013-10-26 ENCOUNTER — Other Ambulatory Visit: Payer: Self-pay

## 2013-10-26 DIAGNOSIS — Z1231 Encounter for screening mammogram for malignant neoplasm of breast: Secondary | ICD-10-CM

## 2013-11-15 ENCOUNTER — Ambulatory Visit
Admission: RE | Admit: 2013-11-15 | Discharge: 2013-11-15 | Disposition: A | Discharge status: Home / Self Care | Payer: Commercial Managed Care - HMO | Source: Ambulatory Visit

## 2013-11-15 DIAGNOSIS — Z1231 Encounter for screening mammogram for malignant neoplasm of breast: Secondary | ICD-10-CM

## 2013-11-22 ENCOUNTER — Encounter: Payer: Self-pay | Admitting: Home Health Services

## 2013-11-22 ENCOUNTER — Ambulatory Visit (INDEPENDENT_AMBULATORY_CARE_PROVIDER_SITE_OTHER): Payer: Commercial Managed Care - HMO | Admitting: Home Health Services

## 2013-11-22 VITALS — BP 145/83 | HR 73 | Temp 97.5°F | Ht 63.0 in | Wt 143.0 lb

## 2013-11-22 DIAGNOSIS — Z0000 Encounter for general adult medical examination without abnormal findings: Secondary | ICD-10-CM

## 2013-11-22 NOTE — Progress Notes (Signed)
Patient here for annual wellness visit, patient reports: Risk Factors/Conditions needing evaluation or treatment: Pt does not have any new risk factors that need evaluation. Home Safety: Pt lives with husband.  Reports having smoke detectors. Other Information: Corrective lens: Pt does not wear corrective lens.  Has annual eye exams. Dentures: Pt has full bottom denture. Has dental exams every few years. Memory: Pt denies any memory problems  Patient's Mini Mental Score (recorded in doc. flowsheet): 30  Balance/Gait: Pt reports no falls in the past 12 months.  We discussed home safety and fall prevention.      Annual Wellness Visit Requirements Recorded Today In  Medical, family, social history Past Medical, Family, Social History Section  Current providers Care team  Current medications Medications  Wt, BP, Ht, BMI Vital signs  Tobacco, alcohol, illicit drug use History  ADL Nurse Assessment  Depression Screening Nurse Assessment  Cognitive impairment Nurse Assessment  Mini Mental Status Document Flowsheet  Fall Risk Fall/Depression  Home Safety Progress Note  End of Life Planning (welcome visit) Social Documentation  Medicare preventative services Progress Note  Risk factors/conditions needing evaluation/treatment Progress Note  Personalized health advice Patient Instructions, goals, letter  Diet & Exercise Social Documentation  Emergency Contact Social Documentation  Seat Belts Social Documentation  Sun exposure/protection Social Documentation

## 2013-11-23 ENCOUNTER — Encounter: Payer: Self-pay | Admitting: Home Health Services

## 2013-11-23 NOTE — Progress Notes (Signed)
Patient ID: Ellwood Dense, female   DOB: 1930-02-22, 80 y.o.   MRN: [id number]  I have reviewed this visit and discussed with Lamont Dowdy and agree with her documentation.  Dossie Arbour MD

## 2013-12-10 ENCOUNTER — Encounter: Payer: Self-pay | Admitting: Family Medicine

## 2013-12-10 ENCOUNTER — Ambulatory Visit (INDEPENDENT_AMBULATORY_CARE_PROVIDER_SITE_OTHER): Payer: Commercial Managed Care - HMO | Admitting: Family Medicine

## 2013-12-10 VITALS — BP 121/77 | HR 66 | Temp 98.1°F | Ht 63.0 in | Wt 144.0 lb

## 2013-12-10 DIAGNOSIS — I10 Essential (primary) hypertension: Secondary | ICD-10-CM

## 2013-12-10 DIAGNOSIS — E785 Hyperlipidemia, unspecified: Secondary | ICD-10-CM

## 2013-12-10 DIAGNOSIS — I872 Venous insufficiency (chronic) (peripheral): Secondary | ICD-10-CM

## 2013-12-10 DIAGNOSIS — N183 Chronic kidney disease, stage 3 unspecified: Secondary | ICD-10-CM

## 2013-12-10 LAB — CMP AND LIVER
ALK PHOS: 65 U/L (ref 39–117)
ALT: 12 U/L (ref 0–35)
AST: 20 U/L (ref 0–37)
Albumin: 4.3 g/dL (ref 3.5–5.2)
BILIRUBIN TOTAL: 0.5 mg/dL (ref 0.2–1.2)
BUN: 22 mg/dL (ref 6–23)
Bilirubin, Direct: 0.1 mg/dL (ref 0.0–0.3)
CALCIUM: 10.1 mg/dL (ref 8.4–10.5)
CHLORIDE: 100 meq/L (ref 96–112)
CO2: 31 mEq/L (ref 19–32)
CREATININE: 0.91 mg/dL (ref 0.50–1.10)
Glucose, Bld: 104 mg/dL — ABNORMAL HIGH (ref 70–99)
Indirect Bilirubin: 0.4 mg/dL (ref 0.2–1.2)
Potassium: 4.7 mEq/L (ref 3.5–5.3)
Sodium: 139 mEq/L (ref 135–145)
Total Protein: 7.4 g/dL (ref 6.0–8.3)

## 2013-12-10 LAB — CBC WITH DIFFERENTIAL/PLATELET
BASOS ABS: 0 10*3/uL (ref 0.0–0.1)
BASOS PCT: 0 % (ref 0–1)
Eosinophils Absolute: 0.2 10*3/uL (ref 0.0–0.7)
Eosinophils Relative: 3 % (ref 0–5)
HEMATOCRIT: 43.4 % (ref 36.0–46.0)
HEMOGLOBIN: 15.2 g/dL — AB (ref 12.0–15.0)
Lymphocytes Relative: 32 % (ref 12–46)
Lymphs Abs: 1.9 10*3/uL (ref 0.7–4.0)
MCH: 31.6 pg (ref 26.0–34.0)
MCHC: 35 g/dL (ref 30.0–36.0)
MCV: 90.2 fL (ref 78.0–100.0)
Monocytes Absolute: 0.4 10*3/uL (ref 0.1–1.0)
Monocytes Relative: 6 % (ref 3–12)
Neutro Abs: 3.5 10*3/uL (ref 1.7–7.7)
Neutrophils Relative %: 59 % (ref 43–77)
Platelets: 244 10*3/uL (ref 150–400)
RBC: 4.81 MIL/uL (ref 3.87–5.11)
RDW: 13.5 % (ref 11.5–15.5)
WBC: 6 10*3/uL (ref 4.0–10.5)

## 2013-12-10 LAB — LIPID PANEL
CHOLESTEROL: 161 mg/dL (ref 0–200)
HDL: 67 mg/dL (ref >39)
LDL Cholesterol: 73 mg/dL (ref 0–99)
Total CHOL/HDL Ratio: 2.4 Ratio
Triglycerides: 106 mg/dL (ref <150)
VLDL: 21 mg/dL (ref 0–40)

## 2013-12-10 LAB — TSH: TSH: 0.193 u[IU]/mL — AB (ref 0.350–4.500)

## 2013-12-10 NOTE — Patient Instructions (Signed)
High Blood pressure - well controlled, stop Enalipril as may be contributing to your dry cough, continue to check your blood pressures at home, call pressures into Dr. Ree Kida in 2 weeks.   Cholesterol - check cholesterol panel, Dr. Ree Kida will call you with your results and discuss the need for Simvastatin  Leg swelling - likely due to venous insufficiency (weak valves in the your legs), continue compression stockings  Please make a follow up appointment for a full skin exam and treatment of the area on your nose  Venous Stasis or Chronic Venous Insufficiency Chronic venous insufficiency, also called venous stasis, is a condition that affects the veins in the legs. The condition prevents blood from being pumped through these veins effectively. Blood may no longer be pumped effectively from the legs back to the heart. This condition can range from mild to severe. With proper treatment, you should be able to continue with an active life. CAUSES  Chronic venous insufficiency occurs when the vein walls become stretched, weakened, or damaged or when valves within the vein are damaged. Some common causes of this include:  High blood pressure inside the veins (venous hypertension).  Increased blood pressure in the leg veins from long periods of sitting or standing.  A blood clot that blocks blood flow in a vein (deep vein thrombosis).  Inflammation of a superficial vein (phlebitis) that causes a blood clot to form. RISK FACTORS Various things can make you more likely to develop chronic venous insufficiency, including:  Family history of this condition.  Obesity.  Pregnancy.  Sedentary lifestyle.  Smoking.  Jobs requiring long periods of standing or sitting in one place.  Being a certain age. Women in their 33s and 77s and men in their 95s are more likely to develop this condition. SIGNS AND SYMPTOMS  Symptoms may include:   Varicose veins.  Skin breakdown or ulcers.  Reddened or  discolored skin on the leg.  Brown, smooth, tight, and painful skin just above the ankle, usually on the inside surface (lipodermatosclerosis).  Swelling. DIAGNOSIS  To diagnose this condition, your health care provider will take a medical history and do a physical exam. The following tests may be ordered to confirm the diagnosis:  Duplex ultrasound A procedure that produces a picture of a blood vessel and nearby organs and also provides information on blood flow through the blood vessel.  Plethysmography A procedure that tests blood flow.  A venogram, or venography A procedure used to look at the veins using X-ray and dye. TREATMENT The goals of treatment are to help you return to an active life and to minimize pain or disability. Treatment will depend on the severity of the condition. Medical procedures may be needed for severe cases. Treatment options may include:   Use of compression stockings. These can help with symptoms and lower the chances of the problem getting worse, but they do not cure the problem.  Sclerotherapy A procedure involving an injection of a material that "dissolves" the damaged veins. Other veins in the network of blood vessels take over the function of the damaged veins.  Surgery to remove the vein or cut off blood flow through the vein (vein stripping or laser ablation surgery).  Surgery to repair a valve. HOME CARE INSTRUCTIONS   Wear compression stockings as directed by your health care provider.  Only take over-the-counter or prescription medicines for pain, discomfort, or fever as directed by your health care provider.  Follow up with your health care provider  as directed. SEEK MEDICAL CARE IF:   You have redness, swelling, or increasing pain in the affected area.  You see a red streak or line that extends up or down from the affected area.  You have a breakdown or loss of skin in the affected area, even if the breakdown is small.  You have an  injury to the affected area. SEEK IMMEDIATE MEDICAL CARE IF:   You have an injury and open wound in the affected area.  Your pain is severe and does not improve with medicine.  You have sudden numbness or weakness in the foot or ankle below the affected area, or you have trouble moving your foot or ankle.  You have a fever or persistent symptoms for more than 2 3 days.  You have a fever and your symptoms suddenly get worse. MAKE SURE YOU:   Understand these instructions.  Will watch your condition.  Will get help right away if you are not doing well or get worse. Document Released: 11/22/2006 Document Revised: 05/09/2013 Document Reviewed: 03/26/2013 [facility] Patient Information 2014 [place].

## 2013-12-10 NOTE — Progress Notes (Signed)
   Subjective:    Patient ID: Amanda Shaw, female    DOB: [birth year], 80 y.o.   MRN: [id number]  HPI 80 y/o female presents for follow up of multiple medical issues.  HTN - currently on Enalapril and HCTZ, patient reports longstanding dry cough, no chest pain, no vision changes, no headaches, denies missed doses  HLD - currently on Simvastatin, tolerating well, does report occasional muscle spasms of her bilateral calves  Venous insufficiency-patient reports daily use her compression stockings, no associated cardiac symptoms including orthopnea or PND, no chest pain  Social-married, nonsmoker   Review of Systems  Constitutional: Negative for fever, chills and fatigue.  Respiratory: Negative for choking and shortness of breath.   Cardiovascular: Positive for leg swelling. Negative for chest pain.  Gastrointestinal: Negative for nausea, vomiting and diarrhea.       Objective:   Physical Exam Vitals: Reviewed General: Pleasant Caucasian female, no acute distress HEENT: Normocephalic, pupils are equal round and reactive to light, extraocular movements are intact, no scleral icterus, nasal septum midline, no rhinorrhea, moist mucous members, uvula midline, no pharyngeal erythema or exudate noted, neck was supple, no anterior posterior cervical lymphadenopathy Cardiac: Regular rate and rhythm, S1 and S2 present, no murmurs, no heaves or thrills Respiratory: Clear to auscultation bilaterally, normal effort Extremity: 1+ edema bilateral lower extremities, 2+ radial and dorsalis pedis pulses bilaterally, multiple superficial veins present     Assessment & Plan:  Please see problem specific assessment and plan.

## 2013-12-11 ENCOUNTER — Telehealth: Payer: Self-pay | Admitting: Family Medicine

## 2013-12-11 DIAGNOSIS — R7989 Other specified abnormal findings of blood chemistry: Secondary | ICD-10-CM

## 2013-12-11 DIAGNOSIS — E785 Hyperlipidemia, unspecified: Secondary | ICD-10-CM

## 2013-12-11 DIAGNOSIS — E739 Lactose intolerance, unspecified: Secondary | ICD-10-CM

## 2013-12-11 NOTE — Assessment & Plan Note (Addendum)
Controlled on current regimen of hydrochlorothiazide 12.5 mg daily and enalapril. As patient has dry cough will discontinue enalapril and monitor blood pressures.

## 2013-12-11 NOTE — Assessment & Plan Note (Signed)
Result based on recent BMP.

## 2013-12-11 NOTE — Assessment & Plan Note (Signed)
Check lipid profile today. Will check patient's ASCVD risk based on lipid profile results. Consider discontinuation of statin as this may be contributing to patient's muscle spasm.

## 2013-12-11 NOTE — Assessment & Plan Note (Signed)
Controlled with daily compression stocking use. No evidence of a cardiac etiology. Previous lab work has identified normal kidney and liver function. -Continue daily use of compression stockings

## 2013-12-14 DIAGNOSIS — R7989 Other specified abnormal findings of blood chemistry: Secondary | ICD-10-CM | POA: Insufficient documentation

## 2013-12-14 NOTE — Telephone Encounter (Signed)
Discuss lab work. Informed patient of mildly elevated blood glucose and counseled on diet/exercise. TSH low and will repeat in 3-4 weeks, ASCVD risk score of 29.5% therefore will continue current dose of Simvastatin.

## 2013-12-14 NOTE — Assessment & Plan Note (Signed)
Fasting blood glucose remains in prediabetic range. -counseled on exercise and diet

## 2013-12-18 ENCOUNTER — Ambulatory Visit (INDEPENDENT_AMBULATORY_CARE_PROVIDER_SITE_OTHER): Payer: Commercial Managed Care - HMO | Admitting: Family Medicine

## 2013-12-18 ENCOUNTER — Encounter: Payer: Self-pay | Admitting: Family Medicine

## 2013-12-18 VITALS — BP 145/80 | HR 69 | Temp 98.1°F | Wt 146.0 lb

## 2013-12-18 DIAGNOSIS — I10 Essential (primary) hypertension: Secondary | ICD-10-CM

## 2013-12-18 DIAGNOSIS — D489 Neoplasm of uncertain behavior, unspecified: Secondary | ICD-10-CM | POA: Insufficient documentation

## 2013-12-18 NOTE — Assessment & Plan Note (Signed)
Patient has a lesion on the bridge of her nose that is suspicious for AK vs SCC. She also has multiple actinic keratoses over her bilateral cheeks, Patient will be referred to dermatology for further followup. She also is multiple lesions consistent with seborrheic keratoses and fibroadenomas. No other malignant appearing neoplasms were identified on her exam today.

## 2013-12-18 NOTE — Progress Notes (Signed)
   Subjective:    Patient ID: Amanda Shaw, female    DOB: 04-Jan-1930, 80 y.o.   MRN: [id number]  HPI 80 year old female presents for skin examination and followup of hypertension.  Skin exam-patient reports chronic skin lesion on the bridge of her nose, has been present there for the past few years, intermittently becomes irritated, she has not had this evaluated before, she does reports questionable "warts" on her lower extremities  Hypertension-patient was recently taken off her enalapril to 2 dry cough, cough is mildly improved, patient has been taking her home blood pressures which range from the 120s to 140s over 70s to 80s. No chest pain, no vision change, no headache   Review of Systems  Constitutional: Negative for fever, chills and fatigue.  Respiratory: Negative for chest tightness and shortness of breath.   Cardiovascular: Negative for chest pain.  Skin: Positive for color change and wound.       Objective:   Physical Exam Vitals: Reviewed General: Pleasant Caucasian female, no acute distress Skin exam: head to toe skin examination completed in the presence of chaperone, patient has an erythematous scaling neoplasm on the bridge of her nose suspicious for AK versus SCC, multiple actinic keratoses identified on bilateral cheek, patient has multiple seborrheic keratoses on her trunk, patient has fibroadenoma over left lateral thigh and right lateral thigh, no suspicious moles identified  Patient was also evaluated by Dr. Azzie Roup who agrees that the lesion on the patient's nose may represent a SCC     Assessment & Plan:  Please see problem specific assessment and plan.

## 2013-12-18 NOTE — Assessment & Plan Note (Signed)
Patient's blood pressure is controlled per JNC 8 guidelines off enalapril. Continue current dose of HCTZ. Continue to monitor clinically.

## 2013-12-18 NOTE — Patient Instructions (Signed)
Skin - you have been referred to Dermatology, you should hear from my office in the next few days  High Blood Pressure - please check your blood pressure at home regularly and let Dr. Ree Kida know if it is elevated

## 2014-01-08 IMAGING — CR DG CHEST 2V
2 series · 2 of 2 positions shown · non-contrast
Comparison: 09/14/2011

CLINICAL DATA: Preadmission evaluation for lumbar fusion.

CHEST - 2 VIEW

[w chest pa]
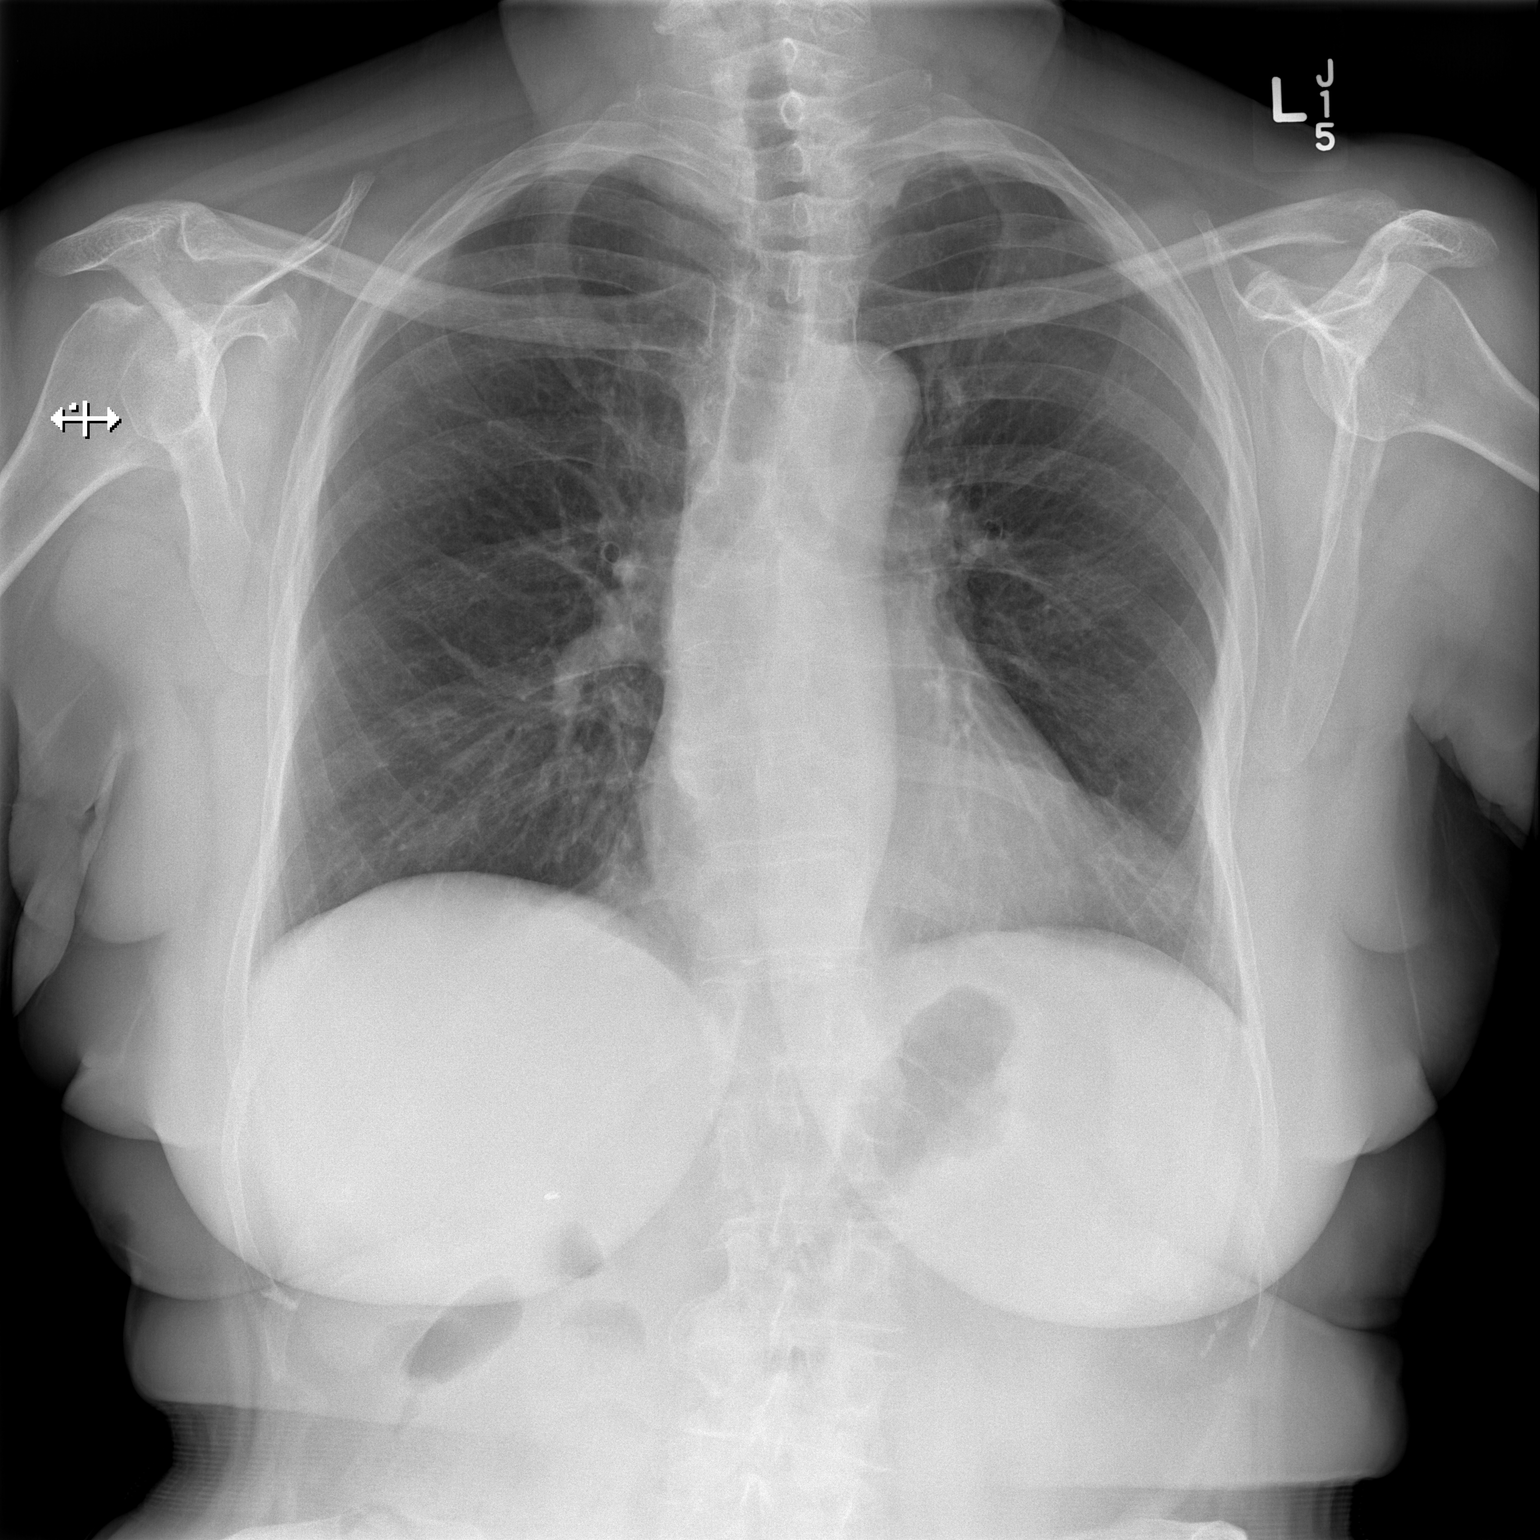

[w chest lat]
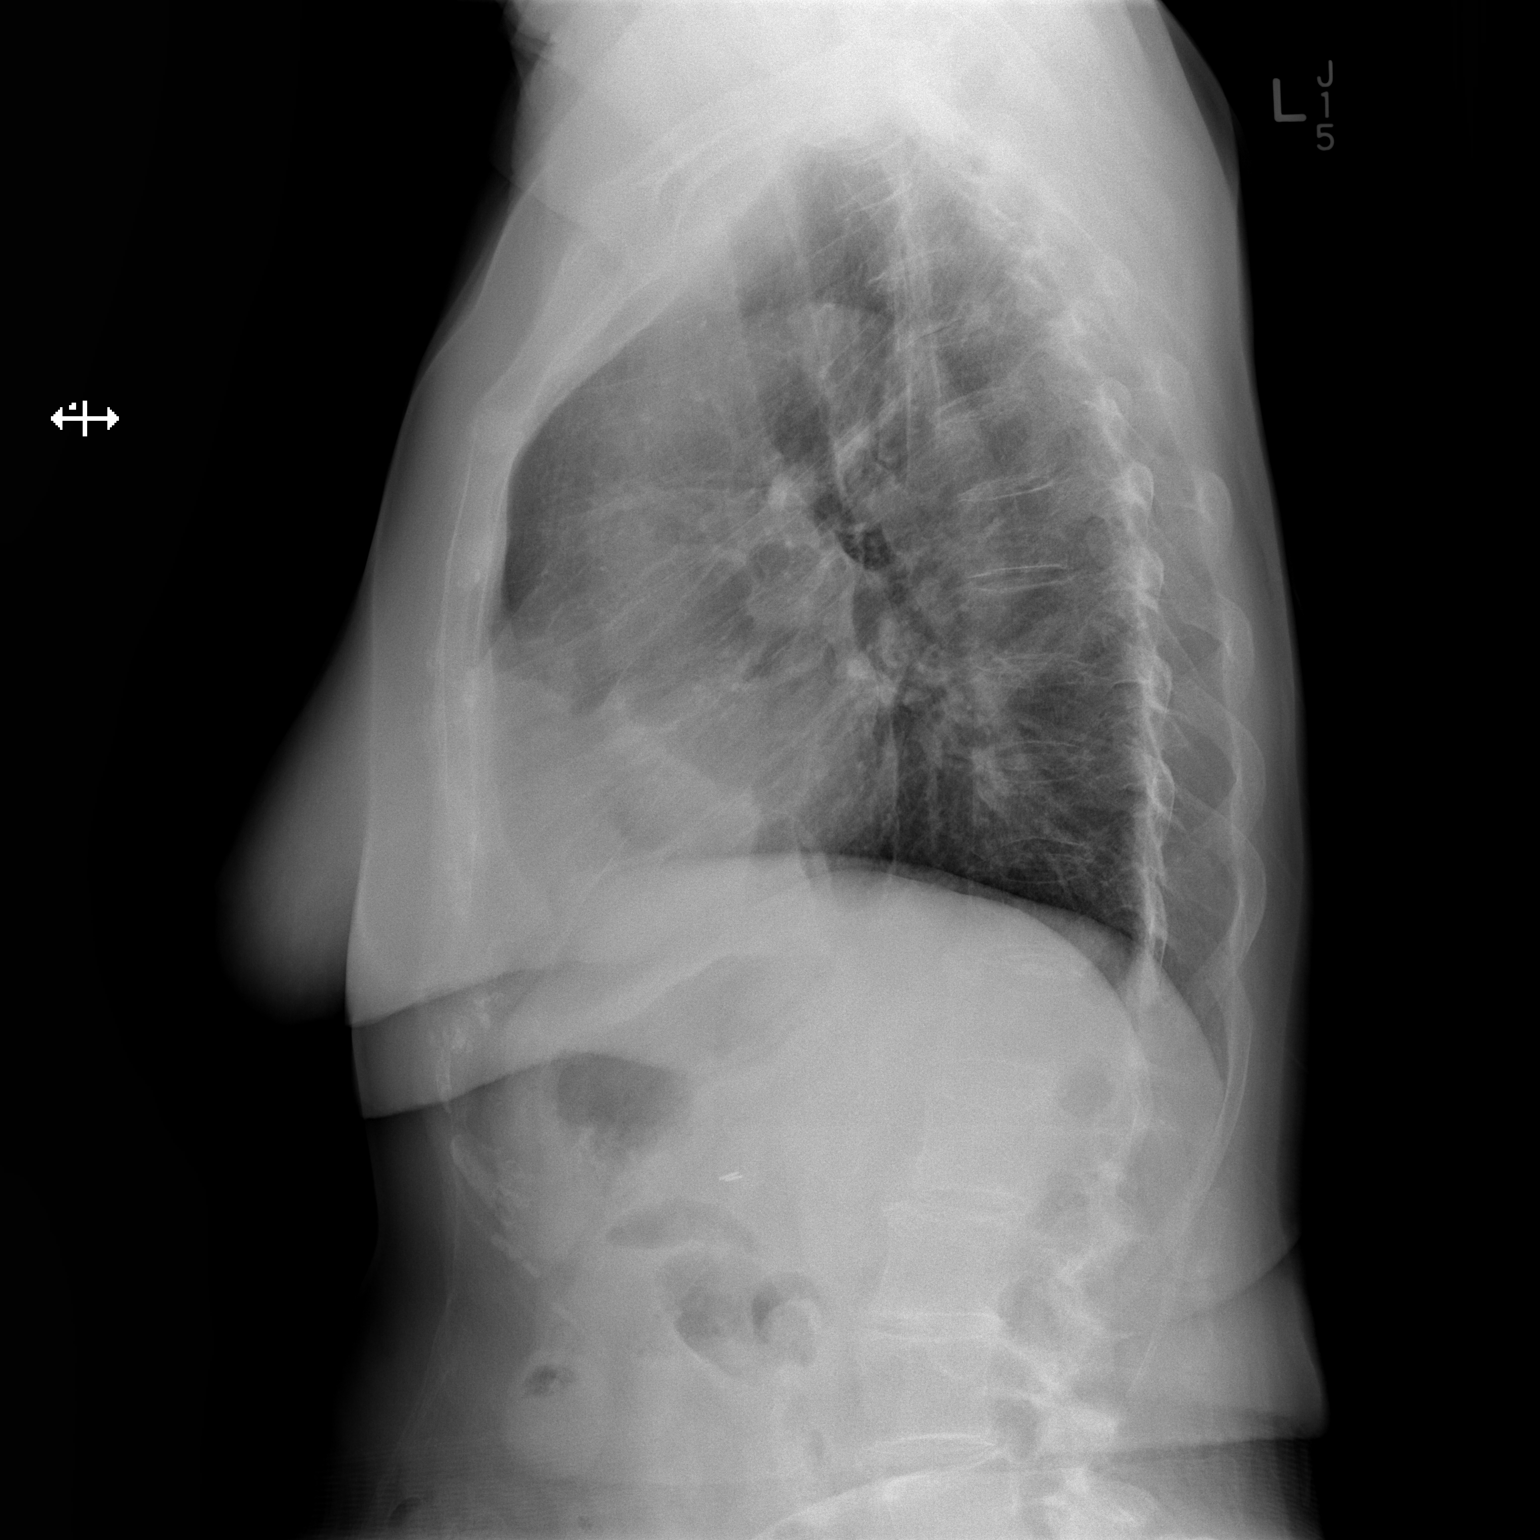

[2 of 2 positions shown; findings below may reference images not displayed]

FINDINGS: The cardiac shadow is stable.  The lungs are clear
bilaterally.  No acute bony abnormality is seen.
IMPRESSION: No acute abnormality noted.

## 2014-01-11 IMAGING — DX DG LUMBAR SPINE 2-3V
1 series · 1 of 1 positions shown · non-contrast
Comparison: Intraoperative lumbar spine localization radiographs
of earlier same day; lumbar spine CT - 03/13/2013

CLINICAL DATA: PLIF L3 - L4

LUMBAR SPINE - 2-3 VIEW,DG C-ARM 1-60 MIN

[lat]
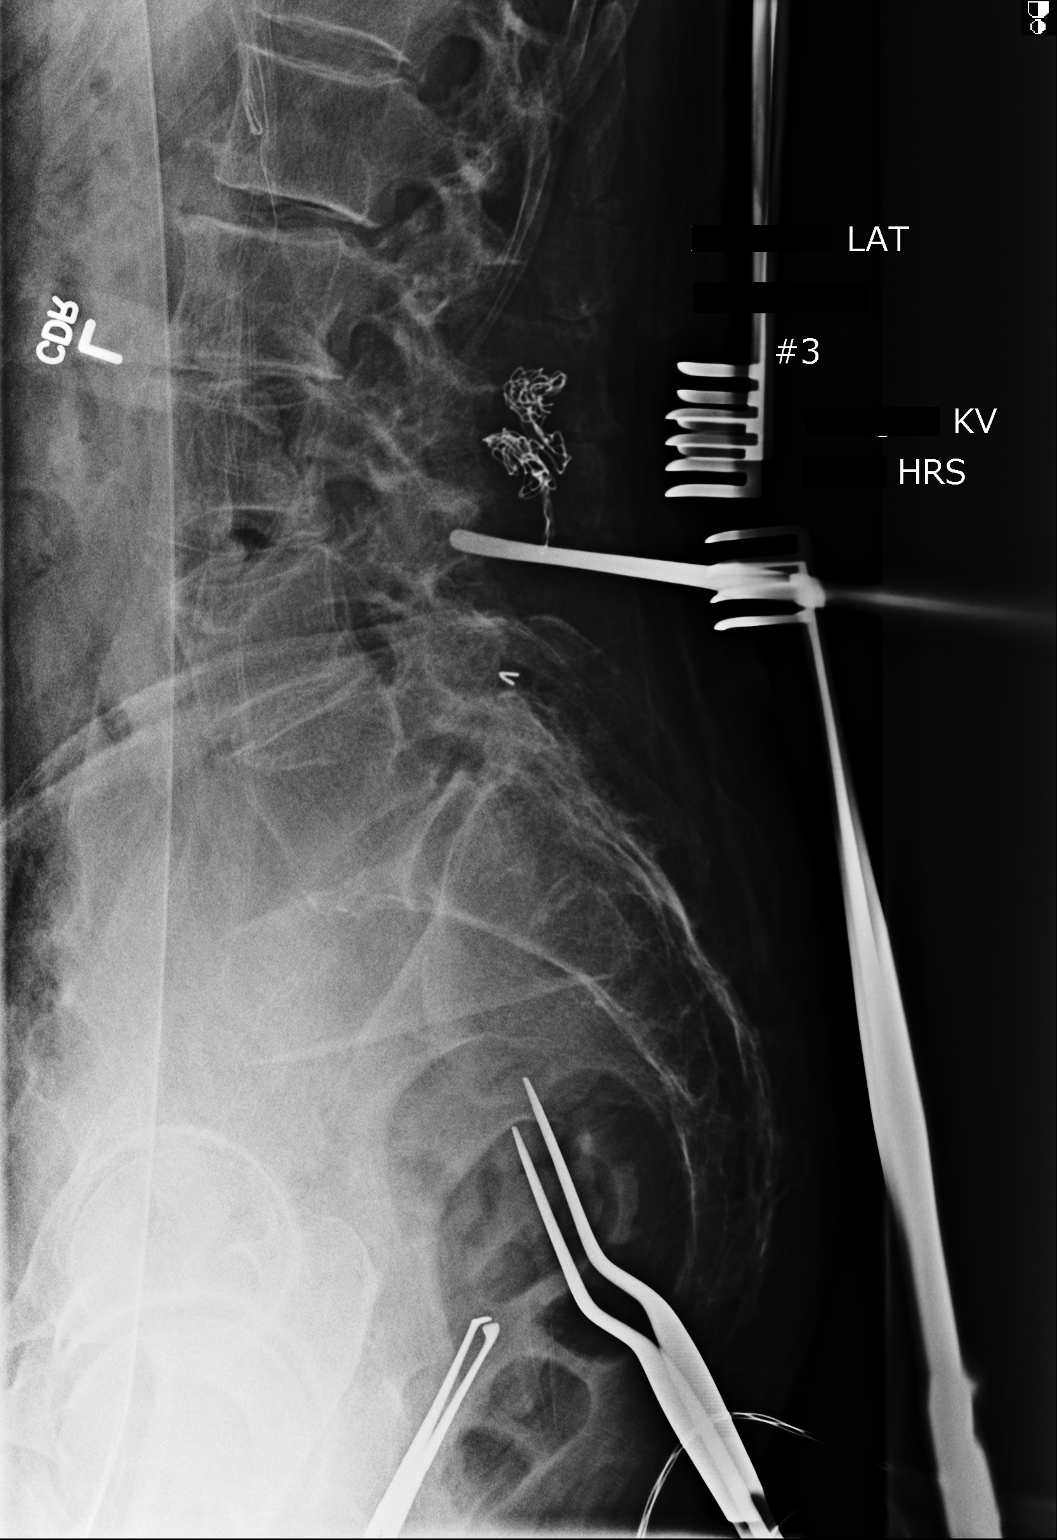

[1 of 1 positions shown; findings below may reference images not displayed]

FINDINGS: Two spot intraoperative radiographic images of the lower lumbar
spine are provided for review.  Images are degraded secondary to
exclusion of the lumbosacral junction.  The most caudal lumbar
vertebral body on the lateral radiograph is presumed to represent
the L5 vertebral body.  The lumbar spine labeling is in keeping
with preprocedural lumbar spine CT.

Images demonstrate the sequela of bilateral pedicular screw
fixation of L3 and L4.  Additional radiopaque surgical support
apparatus is seen posterior to the operative site.
IMPRESSION: Degraded examination demonstrates a sequela of presumed L3 - L4
PLIF.

## 2014-01-15 ENCOUNTER — Other Ambulatory Visit: Payer: Commercial Managed Care - HMO

## 2014-01-15 ENCOUNTER — Other Ambulatory Visit: Payer: Self-pay | Admitting: Family Medicine

## 2014-01-15 DIAGNOSIS — R7989 Other specified abnormal findings of blood chemistry: Secondary | ICD-10-CM

## 2014-01-15 NOTE — Progress Notes (Signed)
TSH DONE TODAY MARCI HOLDER 

## 2014-01-16 LAB — T4, FREE: FREE T4: 0.96 ng/dL (ref 0.80–1.80)

## 2014-01-16 LAB — TSH: TSH: 0.183 u[IU]/mL — ABNORMAL LOW (ref 0.350–4.500)

## 2014-01-16 LAB — T3, FREE: T3 FREE: 3.4 pg/mL (ref 2.3–4.2)

## 2014-01-18 ENCOUNTER — Telehealth: Payer: Self-pay | Admitting: Family Medicine

## 2014-01-18 DIAGNOSIS — E0590 Thyrotoxicosis, unspecified without thyrotoxic crisis or storm: Secondary | ICD-10-CM | POA: Insufficient documentation

## 2014-01-18 NOTE — Assessment & Plan Note (Signed)
Lab work consistent with subclinical hyperthyroidism. Ordered thyroid uptake and scan.

## 2014-01-18 NOTE — Telephone Encounter (Signed)
Dicussed lab results with patient. She has subclinical hyperthyroidism. Will order thyroid uptake and scan.

## 2014-01-21 NOTE — Telephone Encounter (Signed)
Called and informed patient of her appointment at radiology at [facility] for a thyroid uptake and scan. It will be on July 7th and July 8th at 1:00 pm on both days.Busick, Kevin Fenton

## 2014-02-05 ENCOUNTER — Encounter (HOSPITAL_COMMUNITY)
Admission: RE | Admit: 2014-02-05 | Discharge: 2014-02-05 | Disposition: A | Discharge status: Home / Self Care | Payer: Medicare HMO | Source: Ambulatory Visit | Attending: Family Medicine | Admitting: Family Medicine

## 2014-02-05 DIAGNOSIS — E0590 Thyrotoxicosis, unspecified without thyrotoxic crisis or storm: Secondary | ICD-10-CM | POA: Insufficient documentation

## 2014-02-06 ENCOUNTER — Encounter (HOSPITAL_COMMUNITY)
Admission: RE | Admit: 2014-02-06 | Discharge: 2014-02-06 | Disposition: A | Discharge status: Home / Self Care | Payer: Medicare HMO | Source: Ambulatory Visit | Attending: Family Medicine | Admitting: Family Medicine

## 2014-02-11 ENCOUNTER — Telehealth: Payer: Self-pay | Admitting: Family Medicine

## 2014-02-11 DIAGNOSIS — E041 Nontoxic single thyroid nodule: Secondary | ICD-10-CM | POA: Insufficient documentation

## 2014-02-11 NOTE — Telephone Encounter (Signed)
Note to nursing staff please schedule thyroid ultrasound and inform patient of date/time, I will contact her to let her know that she is being scheduled for an [place] of her thyroid.

## 2014-02-12 NOTE — Telephone Encounter (Signed)
Online prior authorization form for [employer] completed, status is currently suspended. Will check back for authorization. After authorization obtained will schedule.

## 2014-02-22 ENCOUNTER — Telehealth: Payer: Self-pay | Admitting: Family Medicine

## 2014-02-22 ENCOUNTER — Encounter: Payer: Self-pay | Admitting: Family Medicine

## 2014-02-22 ENCOUNTER — Ambulatory Visit (HOSPITAL_COMMUNITY)
Admission: RE | Admit: 2014-02-22 | Discharge: 2014-02-22 | Disposition: A | Discharge status: Home / Self Care | Payer: Medicare HMO | Source: Ambulatory Visit | Attending: Family Medicine | Admitting: Family Medicine

## 2014-02-22 DIAGNOSIS — E041 Nontoxic single thyroid nodule: Secondary | ICD-10-CM | POA: Insufficient documentation

## 2014-02-22 NOTE — Telephone Encounter (Signed)
Discussed thyroid US results, order for thyroid biopsy placed, scheduling to contact patient for biopsy time/date.

## 2014-02-27 ENCOUNTER — Ambulatory Visit (HOSPITAL_COMMUNITY)
Admission: RE | Admit: 2014-02-27 | Discharge: 2014-02-27 | Disposition: A | Discharge status: Home / Self Care | Payer: Medicare HMO | Source: Ambulatory Visit | Attending: Family Medicine | Admitting: Family Medicine

## 2014-02-27 DIAGNOSIS — E041 Nontoxic single thyroid nodule: Secondary | ICD-10-CM | POA: Diagnosis present

## 2014-02-27 NOTE — Progress Notes (Signed)
Pt denies c/o gait steady to BR, escorted ambulatory with spouse home

## 2014-02-27 NOTE — Procedures (Signed)
R lobe nodule FNA times one 25 g No comp

## 2014-03-04 ENCOUNTER — Other Ambulatory Visit: Payer: Self-pay | Admitting: Family Medicine

## 2014-03-04 ENCOUNTER — Telehealth: Payer: Self-pay | Admitting: Family Medicine

## 2014-03-04 NOTE — Telephone Encounter (Signed)
Husband called and wanted to know if the results from her test have been seen and do you know the out come. Please call at either number listed in [place]. jw

## 2014-03-05 NOTE — Telephone Encounter (Signed)
Discussed negative thyroid biopsy.

## 2014-03-05 NOTE — Telephone Encounter (Signed)
Husband called again and really would like Dr. Ree Kida to call him about the thyroid results. jw

## 2014-06-03 ENCOUNTER — Encounter: Payer: Self-pay | Admitting: Family Medicine

## 2014-06-04 ENCOUNTER — Ambulatory Visit (INDEPENDENT_AMBULATORY_CARE_PROVIDER_SITE_OTHER): Payer: Commercial Managed Care - HMO | Admitting: Family Medicine

## 2014-06-04 ENCOUNTER — Encounter: Payer: Self-pay | Admitting: Family Medicine

## 2014-06-04 VITALS — BP 141/82 | HR 76 | Temp 97.8°F | Ht 63.0 in | Wt 144.0 lb

## 2014-06-04 DIAGNOSIS — Z23 Encounter for immunization: Secondary | ICD-10-CM

## 2014-06-04 DIAGNOSIS — I10 Essential (primary) hypertension: Secondary | ICD-10-CM

## 2014-06-04 DIAGNOSIS — Z0000 Encounter for general adult medical examination without abnormal findings: Secondary | ICD-10-CM

## 2014-06-04 NOTE — Patient Instructions (Signed)
It was nice to see you today.  No medication changes. You are up to date on immunizations. You are up to date on Cancer Screening.   Please continue to walk daily and eat a healthy diet.   Return for recheck in 3 months.

## 2014-06-05 DIAGNOSIS — Z0000 Encounter for general adult medical examination without abnormal findings: Secondary | ICD-10-CM | POA: Insufficient documentation

## 2014-06-05 NOTE — Progress Notes (Signed)
Patient ID: Amanda Shaw, female   DOB: January 02, 1930, 80 y.o.   MRN: [id number] 80 y.o. year old female presents for well woman/preventative visit.   Acute Concerns: No acute concerns.   Exercise: Exercises daily (mostly walking)  POA/Living Will: does not have living will, would like husband to make medical decisions for her  Social:  History   Social History  . Marital Status: Married    Spouse Name: Mortimer Fries    Number of Children: 4  . Years of Education: 12   Social History Main Topics  . Smoking status: Never Smoker   . Smokeless tobacco: Never Used  . Alcohol Use: No  . Drug Use: No  . Sexual Activity: None   Other Topics Concern  . None   Social History Narrative   Health Care POA:    Emergency Contact: Neyda Durango, husband   Children 4, grand 3, great grand 4   End of Life Plan:    Who lives with you: Lives with husband in 1 story home   Any pets: none   Diet: Patient has a varied diet of protein, starch and vegetables   Exercise: Patient has right hip ligament surgery 2/13- has not started walking again.    Seatbelts: Patient reports wearing seat belt when in vehicle.   Sun Exposure/Protection: Patient reports using sun protection on her face.    Hobbies: Watching TV, cooking/eating   Church: [employer]               HTN - currently on HCTZ 12.5 mg daily, no side effects, takes daily, no vision changes/chest pain/headaches  Immunization:  Tdap/TD: 2009  Influenza: 2015  Pneumococcal: 1999 (23 Valent)  Herpes Zoster: 2011  Cancer Screening:  Pap Smear: has had hysterectomy  Mammogram: 2015, no breast concerns  Colonoscopy: 2007  Dexa: 2013 (osteopenia)  Physical Exam: VITALS: reviewed GEN: pleasant female, NAD, accompanied by husband HEENT: normocephalic, PERRL, EOMI, no scleral icterus, nasal septum midline, MMM, uvula midline, neck supple, no thyromegaly, no adenopathy CARDIAC: RRR, S1 and S2 present, no murmur, no  heaves/thrills RESP: CTAB, normal effort BREAST: not performed today  ABD: soft, normal bowel sounds, no tenderness GU/GYN: not performed today  EXT: no edema, 2+ DP and radial pulses SKIN: deferred as recently seen by dermatology  ASSESSMENT & PLAN: 80 y.o. female presents for annual well woman/preventative exam. Please see problem specific assessment and plan.

## 2014-06-05 NOTE — Assessment & Plan Note (Signed)
Preventative healthcare discussed with patient. -Discussed diet and exercise -Up to date on cancer screening -Up to date on immunizations (provided 13 valent pneumococcal vaccine today) -Information of living will provided

## 2014-06-05 NOTE — Assessment & Plan Note (Signed)
Controlled on current regimen. -no changes in therapy

## 2014-08-20 ENCOUNTER — Other Ambulatory Visit: Payer: Self-pay | Admitting: Family Medicine

## 2014-08-20 NOTE — Telephone Encounter (Signed)
HCTZ, simvastatin ,  [employer]  [phone]

## 2014-08-21 MED ORDER — HYDROCHLOROTHIAZIDE 25 MG PO TABS: ORAL_TABLET | ORAL | Status: DC

## 2014-08-21 MED ORDER — SIMVASTATIN 20 MG PO TABS: 20.0000 mg | ORAL_TABLET | Freq: Every evening | ORAL | Status: DC

## 2014-12-26 ENCOUNTER — Telehealth: Payer: Self-pay | Admitting: Family Medicine

## 2014-12-26 ENCOUNTER — Encounter: Payer: Self-pay | Admitting: Family Medicine

## 2014-12-26 DIAGNOSIS — H409 Unspecified glaucoma: Secondary | ICD-10-CM

## 2014-12-26 NOTE — Telephone Encounter (Signed)
Needs [employer] [facility] referral Fax [phone] Will be seeing dr Katy Fitch on 12-31-14

## 2014-12-26 NOTE — Progress Notes (Signed)
Patient left a message on the medical records line saying that she needs a referral to Dr. Patrici Ranks office.  She has an appointment there on Tues,May 31.

## 2014-12-27 NOTE — Progress Notes (Signed)
Note to nursing staff - please inform patient that referral has been placed to ophthalmology.

## 2014-12-27 NOTE — Telephone Encounter (Signed)
Referral completed in acuity connect, currently suspended ID# [id number]

## 2014-12-31 ENCOUNTER — Telehealth: Payer: Self-pay | Admitting: Family Medicine

## 2014-12-31 DIAGNOSIS — H02834 Dermatochalasis of left upper eyelid: Secondary | ICD-10-CM | POA: Diagnosis not present

## 2014-12-31 DIAGNOSIS — H40053 Ocular hypertension, bilateral: Secondary | ICD-10-CM | POA: Diagnosis not present

## 2014-12-31 DIAGNOSIS — H02831 Dermatochalasis of right upper eyelid: Secondary | ICD-10-CM | POA: Diagnosis not present

## 2014-12-31 DIAGNOSIS — H2513 Age-related nuclear cataract, bilateral: Secondary | ICD-10-CM | POA: Diagnosis not present

## 2014-12-31 NOTE — Telephone Encounter (Signed)
Patient informed referral was completed last week.

## 2014-12-31 NOTE — Telephone Encounter (Signed)
Would like referral to [facility] Has appt at 9:30 Doesn't know if a referral is needed Please advise

## 2015-01-21 ENCOUNTER — Other Ambulatory Visit: Payer: Self-pay | Admitting: Family Medicine

## 2015-02-18 ENCOUNTER — Encounter: Payer: Self-pay | Admitting: Family Medicine

## 2015-02-18 ENCOUNTER — Ambulatory Visit (INDEPENDENT_AMBULATORY_CARE_PROVIDER_SITE_OTHER): Payer: Commercial Managed Care - HMO | Admitting: Family Medicine

## 2015-02-18 VITALS — BP 148/55 | HR 87 | Temp 97.7°F | Ht 63.0 in | Wt 146.2 lb

## 2015-02-18 DIAGNOSIS — S90221A Contusion of right lesser toe(s) with damage to nail, initial encounter: Secondary | ICD-10-CM

## 2015-02-18 HISTORY — DX: Contusion of right lesser toe(s) with damage to nail, initial encounter: S90.221A

## 2015-02-18 NOTE — Progress Notes (Signed)
   Subjective:    Patient ID: Amanda Shaw, female    DOB: 02/22/1930, 81 y.o.   MRN: [id number]  HPI 81 y/o female presents for follow up of right great toenail injury.   Patient [place] an [place] to her right [place], she reports no trauma however had repetitive irritation/injury to the area, she was wearing new shoes at the time, now has blood under the toenail, currently painless, no fevers or chills.    Review of Systems See HPI.     Objective:   Physical Exam Vitals: reviewed Ext: right great toe subungual hematoma, no evidence of infection, nail still attached at the base     Assessment & Plan:  Please see problem specific assessment and plan.

## 2015-02-18 NOTE — Assessment & Plan Note (Signed)
Subungual hematoma of right great toe. No acute pain or infection. -continue daily epsom salt soaks -monitor clinically

## 2015-07-10 ENCOUNTER — Encounter: Payer: Self-pay | Admitting: Family Medicine

## 2015-07-10 ENCOUNTER — Ambulatory Visit (INDEPENDENT_AMBULATORY_CARE_PROVIDER_SITE_OTHER): Payer: Commercial Managed Care - HMO | Admitting: Family Medicine

## 2015-07-10 VITALS — BP 177/77 | HR 70 | Temp 97.6°F | Ht 63.0 in | Wt 143.3 lb

## 2015-07-10 DIAGNOSIS — M8580 Other specified disorders of bone density and structure, unspecified site: Secondary | ICD-10-CM

## 2015-07-10 DIAGNOSIS — I10 Essential (primary) hypertension: Secondary | ICD-10-CM | POA: Diagnosis not present

## 2015-07-10 DIAGNOSIS — Z0000 Encounter for general adult medical examination without abnormal findings: Secondary | ICD-10-CM | POA: Diagnosis not present

## 2015-07-10 LAB — BASIC METABOLIC PANEL
BUN: 17 mg/dL (ref 7–25)
CALCIUM: 10.1 mg/dL (ref 8.6–10.4)
CHLORIDE: 100 mmol/L (ref 98–110)
CO2: 31 mmol/L (ref 20–31)
CREATININE: 0.8 mg/dL (ref 0.60–0.88)
GLUCOSE: 94 mg/dL (ref 65–99)
Potassium: 4.6 mmol/L (ref 3.5–5.3)
Sodium: 141 mmol/L (ref 135–146)

## 2015-07-10 LAB — CBC
HEMATOCRIT: 46.1 % — AB (ref 36.0–46.0)
HEMOGLOBIN: 15.6 g/dL — AB (ref 12.0–15.0)
MCH: 31.5 pg (ref 26.0–34.0)
MCHC: 33.8 g/dL (ref 30.0–36.0)
MCV: 93.1 fL (ref 78.0–100.0)
MPV: 10.8 fL (ref 8.6–12.4)
Platelets: 239 10*3/uL (ref 150–400)
RBC: 4.95 MIL/uL (ref 3.87–5.11)
RDW: 13.2 % (ref 11.5–15.5)
WBC: 5.5 10*3/uL (ref 4.0–10.5)

## 2015-07-10 LAB — LIPID PANEL
CHOL/HDL RATIO: 2 ratio (ref <=5.0)
Cholesterol: 158 mg/dL (ref 125–200)
HDL: 78 mg/dL (ref >=46)
LDL CALC: 60 mg/dL (ref <130)
Triglycerides: 102 mg/dL (ref <150)
VLDL: 20 mg/dL (ref <30)

## 2015-07-10 NOTE — Assessment & Plan Note (Signed)
81 y/o female presents for annual preventative visit. -Up to date on immunizations -No longer a candidate for Pap Smear/Colonoscopy -Up to date on mammogram, discussed with patient that she can continue to get Mammogram every 1-2 years if she wishes as she has likely greater than 10 year life expectancy -Routine labs obtained

## 2015-07-10 NOTE — Assessment & Plan Note (Signed)
BP elevated today. No symptoms. -will have patient check blood pressure at home and if still elevated will adjust medications

## 2015-07-10 NOTE — Patient Instructions (Signed)
It was nice to see you today.  Bone Density Scan - my nurse will call you to schedule this  Dr. Ree Kida will call you with you lab results.

## 2015-07-10 NOTE — Progress Notes (Signed)
81 y.o. year old female presents for well woman/preventative visit.  Acute Concerns: None  Diet: Healthy, varied  Exercise: Walks daily  Sexual/Birth History: See below  Birth Control: Postmenopausal  POA/Living Will: Jesus Genera (Husband)  Social:  Social History   Social History  . Marital Status: Married    Spouse Name: Mortimer Fries  . Number of Children: 4  . Years of Education: 58   Social History Main Topics  . Smoking status: Never Smoker   . Smokeless tobacco: Never Used  . Alcohol Use: No  . Drug Use: No  . Sexual Activity: Not Asked   Other Topics Concern  . None   Social History Narrative   Health Care POA:    Emergency Contact: Seraphim [place]els[place], husband   Children 4, grand 3, great grand 4   End of Life Plan:    Who lives with you: Lives with husband in 1 story home   Any pets: none   Diet: Patient has a varied diet of protein, starch and vegetables   Exercise: Patient has right hip ligament surgery 2/13- has not started walking again.    Seatbelts: Patient reports wearing seat belt when in vehicle.   Sun Exposure/Protection: Patient reports using sun protection on her face.    Hobbies: Watching TV, cooking/eating   Church: [employer]: Immunization History  Administered Date(s) Administered  . Influenza Whole 04/26/2008, 05/13/2009, 05/05/2010  . Influenza-Unspecified 06/01/2013  . Pneumococcal Conjugate-13 06/04/2014  . Pneumococcal Polysaccharide-23 07/08/1998  . Td 04/02/1997, 06/13/2008  . Zoster 07/02/2010    Cancer Screening:  Pap Smear: Over 61  Mammogram: 10/2013, no reported breast issues  Colonoscopy: over 75, no reported bowel issues  Dexa: 06/2012 (osteopenia)    Physical Exam: VITALS: Reviewed, BP elevated GEN: Pleasant female, NAD HEENT: Normocephalic, PERRL, EOMI, no scleral icterus, bilateral TM pearly grey, nasal septum midline, MMM, uvula midline, no anterior or posterior lymphadenopathy,  no thyromegaly CARDIAC:RRR, S1 and S2 present, no murmur, no heaves/thrills RESP: CTAB, normal effort BREAST:Exam performed in the presence of a chaperone. No masses. No nipple discharge. No axillary lymphadenopathy. ABD: soft, no tenderness, normal bowel sounds GU/GYN:Exam performed in the presence of a chaperone. Normal external genitalia. Cervix unremarkable. Bimanual exam identified no masses EXT: No edema, 2+ radial and DP pulses SKIN: no rash  ASSESSMENT & PLAN: 81 y.o. female presents for annual well woman/preventative exam and GYN exam. Please see problem specific assessment and plan.   Preventative health care 81 y/o female presents for annual preventative visit. -Up to date on immunizations -No longer a candidate for Pap Smear/Colonoscopy -Up to date on mammogram, discussed with patient that she can continue to get Mammogram every 1-2 years if she wishes as she has likely greater than 10 year life expectancy -Routine labs obtained  HYPERTENSION, BENIGN SYSTEMIC BP elevated today. No symptoms. -will have patient check blood pressure at home and if still elevated will adjust medications

## 2015-07-11 ENCOUNTER — Telehealth: Payer: Self-pay | Admitting: Family Medicine

## 2015-07-11 NOTE — Telephone Encounter (Signed)
Discussed results with patient's husband. Informed him to have patient check BP 1-2 times daily and call results into the office.

## 2015-08-04 ENCOUNTER — Other Ambulatory Visit: Payer: Self-pay | Admitting: Family Medicine

## 2015-08-13 ENCOUNTER — Ambulatory Visit
Admission: RE | Admit: 2015-08-13 | Discharge: 2015-08-13 | Disposition: A | Discharge status: Home / Self Care | Payer: Commercial Managed Care - HMO | Source: Ambulatory Visit | Attending: Family Medicine | Admitting: Family Medicine

## 2015-08-13 DIAGNOSIS — M810 Age-related osteoporosis without current pathological fracture: Secondary | ICD-10-CM

## 2015-08-13 DIAGNOSIS — M8580 Other specified disorders of bone density and structure, unspecified site: Secondary | ICD-10-CM

## 2015-08-13 DIAGNOSIS — R29890 Loss of height: Secondary | ICD-10-CM | POA: Diagnosis not present

## 2015-08-14 ENCOUNTER — Telehealth: Payer: Self-pay | Admitting: Family Medicine

## 2015-08-14 MED ORDER — ALENDRONATE SODIUM 70 MG PO TABS: 70.0000 mg | ORAL_TABLET | ORAL | Status: DC

## 2015-08-14 NOTE — Telephone Encounter (Signed)
Attempted to contact patient, no answer, left message that I will return call.

## 2015-08-14 NOTE — Telephone Encounter (Signed)
Discussed bone density testing. Meets criteria for Osteoporosis based on FRAX. Start Fosamax.

## 2015-12-22 ENCOUNTER — Other Ambulatory Visit: Payer: Self-pay | Admitting: Family Medicine

## 2016-01-08 DIAGNOSIS — H2513 Age-related nuclear cataract, bilateral: Secondary | ICD-10-CM | POA: Diagnosis not present

## 2016-01-08 DIAGNOSIS — H40053 Ocular hypertension, bilateral: Secondary | ICD-10-CM | POA: Diagnosis not present

## 2016-07-14 NOTE — Progress Notes (Signed)
82 y.o. year old female presents for well woman/preventative visit and annual GYN examination.  Acute Concerns: HTN - currently on HCTZ 12.5 mg daily, occasionally lightheaded/dizzy when sitting up/standing up. No passing out. No associated chest pain. Checks blood pressure at home (normally [id number] systolic, [id number] diastolic)  Stye right eye - one week, applying warm compresses  Diet: varied protein, starch, vegetables; breakfast - OJ, banana, protein shake (carnation); Lunch - sandwhich + vegetables; Dinner - varied meat/pasta/fruits/vegetables  Exercise: walks daily (30 - 60 minutes), no weight training  Sexual/Birth History: Sex with husband, no pain with intercourse  Birth Control: Postmenopausal  POA/Living Will: Jesus Genera (Husband)  Social:  Social History   Social History  . Marital status: Married    Spouse name: Mortimer Fries  . Number of children: 4  . Years of education: 40   Social History Main Topics  . Smoking status: Never Smoker  . Smokeless tobacco: Never Used  . Alcohol use No  . Drug use: No  . Sexual activity: Not Asked   Other Topics Concern  . None   Social History Narrative   Health Care POA:    Emergency Contact: Shaneese Lanford, husband   Children 4, grand 3, great grand 4   End of Life Plan:    Who lives with you: Lives with husband in 1 story home   Any pets: none   Diet: Patient has a varied diet of protein, starch and vegetables   Exercise: Patient has right hip ligament surgery 2/13- has not started walking again.    Seatbelts: Patient reports wearing seat belt when in vehicle.   Sun Exposure/Protection: Patient reports using sun protection on her face.    Hobbies: Watching TV, cooking/eating   Church: [employer]: Immunization History  Administered Date(s) Administered  . Influenza Whole 04/26/2008, 05/13/2009, 05/05/2010  . Influenza-Unspecified 06/01/2013, 04/02/2016  . Pneumococcal Conjugate-13  06/04/2014  . Pneumococcal Polysaccharide-23 07/08/1998  . Td 04/02/1997, 06/13/2008  . Zoster 07/02/2010  Done in September   Cancer Screening:  Pap Smear: Not a candidate due to age, no vaginal bleeding  Mammogram: Not a candidate due to age, no breast changes  Colonoscopy: Not a candidate due to age, no blood in stool  Dexa:Completed January 2017 (Osteoporosis). On Fosamax and Vit D/Calcium supplementation  BP (!) 168/82   Pulse 64   Temp 97.5 F (36.4 C) (Oral)   Ht 5\' 3"  (1.6 m)   Wt 148 lb 12.8 oz (67.5 kg)   BMI 26.36 kg/m    Physical Exam: VITALS: Reviewed GEN: Pleasant female, NAD HEENT: Normocephalic, PERRL, EOMI, no scleral icterus, bilateral TM pearly grey, nasal septum midline, MMM, uvula midline, no anterior or posterior lymphadenopathy, no thyromegaly CARDIAC:RRR, S1 and S2 present, no murmur, no heaves/thrills RESP: CTAB, normal effort BREAST: Deferred ABD: soft, no tenderness, normal bowel sounds GU/GYN: Deferred EXT: No edema, 2+ radial and DP pulses SKIN: 3 mm by 3 mm papule lesion with scale (SK vs Basal), scale right temple likely AK.   ASSESSMENT & PLAN: 82 y.o. female presents for annual well woman/preventative exam. Please see problem specific assessment and plan.   Preventative health care 82 y/o female presents for preventative visit. -up to date on immunizations -Not a candidate for mammogram, pap smear, or colonoscopy -discussed healthy diet and regular exercise -has POA  Neoplasm of uncertain behavior 3 by 3 lesion left temple (Basal vs SK) -  return to office for shave biopsy  AK (actinic keratosis) AK of right temple. -return to office for cryotherapy  HYPERTENSION, BENIGN SYSTEMIC Uncontrolled in office. Pressured controlled at home. Having symptoms consistent with orthostatic hypotension intermittently. -return to office for RN BP check, patient to bring home BP cuff to compare readings  Stye Stye right eye. -warm compresses

## 2016-07-16 ENCOUNTER — Ambulatory Visit (INDEPENDENT_AMBULATORY_CARE_PROVIDER_SITE_OTHER): Payer: Commercial Managed Care - HMO | Admitting: Family Medicine

## 2016-07-16 ENCOUNTER — Encounter: Payer: Self-pay | Admitting: Family Medicine

## 2016-07-16 DIAGNOSIS — D489 Neoplasm of uncertain behavior, unspecified: Secondary | ICD-10-CM

## 2016-07-16 DIAGNOSIS — I10 Essential (primary) hypertension: Secondary | ICD-10-CM

## 2016-07-16 DIAGNOSIS — Z0000 Encounter for general adult medical examination without abnormal findings: Secondary | ICD-10-CM

## 2016-07-16 DIAGNOSIS — R946 Abnormal results of thyroid function studies: Secondary | ICD-10-CM

## 2016-07-16 DIAGNOSIS — H00012 Hordeolum externum right lower eyelid: Secondary | ICD-10-CM

## 2016-07-16 DIAGNOSIS — R7989 Other specified abnormal findings of blood chemistry: Secondary | ICD-10-CM

## 2016-07-16 DIAGNOSIS — H00019 Hordeolum externum unspecified eye, unspecified eyelid: Secondary | ICD-10-CM | POA: Insufficient documentation

## 2016-07-16 DIAGNOSIS — L570 Actinic keratosis: Secondary | ICD-10-CM | POA: Insufficient documentation

## 2016-07-16 LAB — COMPLETE METABOLIC PANEL WITH GFR
ALBUMIN: 4 g/dL (ref 3.6–5.1)
ALK PHOS: 53 U/L (ref 33–130)
ALT: 11 U/L (ref 6–29)
AST: 21 U/L (ref 10–35)
BILIRUBIN TOTAL: 0.6 mg/dL (ref 0.2–1.2)
BUN: 23 mg/dL (ref 7–25)
CALCIUM: 9.6 mg/dL (ref 8.6–10.4)
CHLORIDE: 102 mmol/L (ref 98–110)
CO2: 31 mmol/L (ref 20–31)
CREATININE: 0.94 mg/dL — AB (ref 0.60–0.88)
GFR, EST AFRICAN AMERICAN: 65 mL/min (ref >=60)
GFR, Est Non African American: 56 mL/min — ABNORMAL LOW (ref >=60)
Glucose, Bld: 106 mg/dL — ABNORMAL HIGH (ref 65–99)
Potassium: 3.9 mmol/L (ref 3.5–5.3)
Sodium: 141 mmol/L (ref 135–146)
Total Protein: 7 g/dL (ref 6.1–8.1)

## 2016-07-16 LAB — LIPID PANEL
CHOLESTEROL: 151 mg/dL (ref <200)
HDL: 67 mg/dL (ref >50)
LDL Cholesterol: 62 mg/dL (ref <100)
TRIGLYCERIDES: 108 mg/dL (ref <150)
Total CHOL/HDL Ratio: 2.3 Ratio (ref <5.0)
VLDL: 22 mg/dL (ref <30)

## 2016-07-16 LAB — CBC WITH DIFFERENTIAL/PLATELET
BASOS PCT: 0 %
Basophils Absolute: 0 cells/uL (ref 0–200)
EOS PCT: 2 %
Eosinophils Absolute: 112 cells/uL (ref 15–500)
HCT: 44.2 % (ref 35.0–45.0)
Hemoglobin: 14.9 g/dL (ref 11.7–15.5)
LYMPHS PCT: 30 %
Lymphs Abs: 1680 cells/uL (ref 850–3900)
MCH: 31.8 pg (ref 27.0–33.0)
MCHC: 33.7 g/dL (ref 32.0–36.0)
MCV: 94.4 fL (ref 80.0–100.0)
MONO ABS: 392 {cells}/uL (ref 200–950)
MONOS PCT: 7 %
MPV: 10.2 fL (ref 7.5–12.5)
NEUTROS PCT: 61 %
Neutro Abs: 3416 cells/uL (ref 1500–7800)
PLATELETS: 197 10*3/uL (ref 140–400)
RBC: 4.68 MIL/uL (ref 3.80–5.10)
RDW: 13.2 % (ref 11.0–15.0)
WBC: 5.6 10*3/uL (ref 3.8–10.8)

## 2016-07-16 LAB — TSH: TSH: 0.54 m[IU]/L

## 2016-07-16 MED ORDER — ALENDRONATE SODIUM 70 MG PO TABS: 70.0000 mg | ORAL_TABLET | ORAL | 11 refills | Status: DC

## 2016-07-16 NOTE — Assessment & Plan Note (Signed)
AK of right temple. -return to office for cryotherapy

## 2016-07-16 NOTE — Assessment & Plan Note (Signed)
Uncontrolled in office. Pressured controlled at home. Having symptoms consistent with orthostatic hypotension intermittently. -return to office for RN BP check, patient to bring home BP cuff to compare readings

## 2016-07-16 NOTE — Patient Instructions (Addendum)
It was nice to see you.  Blood Pressure - elevated today, please schedule a nurse visit for a blood pressure check, bring your home BP cuff  Dr. Ree Kida will call you with you lab results.   Please make a follow up appointment to have the lesion on your face removed

## 2016-07-16 NOTE — Assessment & Plan Note (Signed)
Stye right eye. -warm compresses

## 2016-07-16 NOTE — Assessment & Plan Note (Signed)
82 y/o female presents for preventative visit. -up to date on immunizations -Not a candidate for mammogram, pap smear, or colonoscopy -discussed healthy diet and regular exercise -has POA

## 2016-07-16 NOTE — Assessment & Plan Note (Signed)
3 by 3 lesion left temple (Basal vs SK) -return to office for shave biopsy

## 2016-07-19 ENCOUNTER — Encounter: Payer: Self-pay | Admitting: Family Medicine

## 2016-09-20 ENCOUNTER — Telehealth: Payer: Self-pay

## 2016-09-20 ENCOUNTER — Telehealth: Payer: Self-pay | Admitting: Family Medicine

## 2016-09-20 NOTE — Telephone Encounter (Signed)
Pt needs Azthromizyn called into [facility]. Husband had some called in for him on Friday and wants to get some for pt as well. Please call pt after it has been called in. ep

## 2016-09-20 NOTE — Telephone Encounter (Signed)
You saw him and his wife on Friday. Gave Mr. Buzzelli a antibiotic Rx. Chelcey is now needing the Rx. Will you send in a Rx to [facility]. If we can get this done so he can pick it up today please call [phone].   Please advise. Ottis Stain, CMA

## 2016-09-21 NOTE — Telephone Encounter (Signed)
Agree with RN plan

## 2016-09-21 NOTE — Telephone Encounter (Signed)
Spoke with patient, informed that she would need to be evaluated before receiving antibiotics. Appointment scheduled for 2/21 at 2pm with Dr. Lajuana Ripple.

## 2016-09-22 ENCOUNTER — Ambulatory Visit: Payer: Self-pay | Admitting: Family Medicine

## 2016-09-22 NOTE — Telephone Encounter (Signed)
Patient is not my patient (although I saw her husband). Patient will likely need to be seen in the office for any abx.

## 2016-09-23 NOTE — Telephone Encounter (Signed)
Already addressed, see previous phone note  

## 2016-10-04 ENCOUNTER — Other Ambulatory Visit: Payer: Self-pay | Admitting: Family Medicine

## 2016-10-25 ENCOUNTER — Encounter (HOSPITAL_COMMUNITY): Payer: Self-pay | Admitting: Emergency Medicine

## 2016-10-25 ENCOUNTER — Ambulatory Visit (HOSPITAL_COMMUNITY)
Admission: EM | Admit: 2016-10-25 | Discharge: 2016-10-25 | Disposition: A | Discharge status: Home / Self Care | Payer: Commercial Managed Care - HMO | Attending: Family Medicine | Admitting: Family Medicine

## 2016-10-25 DIAGNOSIS — S61011A Laceration without foreign body of right thumb without damage to nail, initial encounter: Secondary | ICD-10-CM

## 2016-10-25 MED ORDER — LIDOCAINE HCL 2 % IJ SOLN
INTRAMUSCULAR | Status: AC
  Filled 2016-10-25: qty 20

## 2016-10-25 NOTE — ED Provider Notes (Signed)
CSN: [id number]     Arrival date & time 10/25/16  1233 History   First MD Initiated Contact with Patient 10/25/16 1348     Chief Complaint  Patient presents with  . Laceration   (Consider location/radiation/quality/duration/timing/severity/associated sxs/prior Treatment) The history is provided by the patient.  Laceration  Location:  Finger Finger laceration location:  R thumb Length:  2 laceration to dorsum of right thumb: About 2 cm linear laceration to base of right thumb and 1.5 cm linear laceration to lateral aspect of right thumb Depth:  Through dermis Quality: straight   Bleeding: controlled with pressure   Time since incident:  4 hours Laceration mechanism:  Knife Pain details:    Quality:  Aching   Severity:  Mild Foreign body present:  No foreign bodies    Past Medical History:  Diagnosis Date  . Arthritis    arthritis in hips   . Fracture of ankle, left, closed 10/1998  . Gastritis 07/2006   by endoscopy  . HEMORRHOIDS, NOS 09/29/2006   Qualifier: Diagnosis of  By: Walker Kehr MD, Patrick Jupiter    . Hypertension   . Subungual hematoma of right foot 02/18/2015   Right great toe    Past Surgical History:  Procedure Laterality Date  . APPENDECTOMY    . BACK SURGERY    . BLADDER SUSPENSION  01/2004  . CHOLECYSTECTOMY  1987  . CRYOABLATION     bridge of nose, Dr Ronnald Ramp  . EXCISION/RELEASE BURSA HIP  09/22/2011   Procedure: EXCISION/RELEASE BURSA HIP;  Surgeon: Gearlean Alf, MD;  Location: [facility];  Service: Orthopedics;  Laterality: Right;  right hip bursectomy   . LUMBAR [place] SURGERY  [phone]  . [place] INJECTION Bilateral 2010  . TONSILLECTOMY  1956  . TRABECULECTOMY Bilateral    laser  . VAGINAL HYSTERECTOMY  1988   benign disease   Family History  Problem Relation Age of Onset  . Heart disease Mother 81  . Anuerysm Father 73  . Heart disease Brother 105    Dementia   Social History  Substance Use Topics  . Smoking status: Never Smoker  . Smokeless  tobacco: Never Used  . Alcohol use No   OB History    Gravida Para Term Preterm AB Living   4 4           SAB TAB Ectopic Multiple Live Births                 Review of Systems  Constitutional: Negative.   Skin:       2 Laceration to right hand thumb. Bleeding present. Controlled with pressure bandage.    Allergies  Patient has no known allergies.  Home Medications   Prior to Admission medications   Medication Sig Start Date End Date Taking? Authorizing Provider  alendronate (FOSAMAX) 70 MG tablet Take 1 tablet (70 mg total) by mouth every 7 (seven) days. Take with a full glass of water on an empty stomach. 07/16/16   Lupita Dawn, MD  aspirin 81 MG tablet Take 81 mg by mouth daily.    Historical Provider, MD  Calcium Carbonate-Vitamin D (CALTRATE 600+D PO) Take 1 tablet by mouth daily.    Historical Provider, MD  hydrochlorothiazide (HYDRODIURIL) 25 MG tablet TAKE 1/2 TABLET EVERY DAY 10/04/16   Lupita Dawn, MD  simvastatin (ZOCOR) 20 MG tablet TAKE 1 TABLET EVERY EVENING 10/04/16   Lupita Dawn, MD   Meds Ordered and Administered this Visit  Medications - No data to display  BP (!) 161/78 (BP Location: Right Arm)   Pulse 75   Temp 98.2 F (36.8 C) (Oral)   Resp 18   SpO2 96%  No data found.   Physical Exam  Constitutional: She appears well-developed and well-nourished. No distress.  HENT:  Head: Normocephalic.  Eyes: Pupils are equal, round, and reactive to light.  Neck: Neck supple.  Cardiovascular: Normal rate, regular rhythm and normal heart sounds.   Pulmonary/Chest: Effort normal and breath sounds normal.  Musculoskeletal: Normal range of motion.  Skin: Skin is warm.  2 superficial laceration to right hand thumb: dorsum aspect. 1) Proximal dorsum aspect : 2 cm linear 2) Lateral aspect 1.5 cm Bleeding present. Controlled with pressure bandage. Mild ecchymosis noted. Normal ROM in Thumb.No pain to palpation at laceration site. Pt UTD with Td    Urgent  Care Course     .Marland KitchenLaceration Repair Date/Time: 10/25/2016 2:34 PM Performed by: Teola Bradley Authorized by: Teola Bradley   Consent:    Consent obtained:  Verbal   Consent given by:  Patient   Risks discussed:  Infection, pain, nerve damage, tendon damage, poor cosmetic result and poor wound healing   Alternatives discussed:  No treatment Anesthesia (see MAR for exact dosages):    Anesthesia method:  Local infiltration   Local anesthetic:  Lidocaine 2% w/o epi Laceration details:    Location:  Finger   Finger location:  R thumb   Wound length (cm): 2 laceration: 2cm and 1.5 cm linear  Repair type:    Repair type:  Intermediate Pre-procedure details:    Preparation:  Patient was prepped and draped in usual sterile fashion Exploration:    Hemostasis achieved with:  Direct pressure   Wound exploration: wound explored through full range of motion and entire depth of wound probed and visualized     Contaminated: no   Treatment:    Area cleansed with:  Betadine   Irrigation solution:  Sterile water   Irrigation method:  Syringe   Visualized foreign bodies/material removed: no   Skin repair:    Repair method:  Sutures   Suture size:  4-0   Suture material:  Nylon   Number of sutures:  6 (5 mattress and 1 simple interrupted) Post-procedure details:    Dressing:  Antibiotic ointment, non-adherent dressing, sterile dressing and bulky dressing   Patient tolerance of procedure:  Tolerated well, no immediate complications Comments:     Pt UTD with Td   (including critical care time)  Labs Review Labs Reviewed - No data to display  Imaging Review No results found.   Visual Acuity Review  Right Eye Distance:   Left Eye Distance:   Bilateral Distance:    Right Eye Near:   Left Eye Near:    Bilateral Near:         MDM   1. Laceration of right thumb without foreign body without damage to nail, initial encounter   Keep wound dry and clean. Suture removal x  7-10 days. If redness, pain, fever, discharge noted return to clinic for re evaluation    Teola Bradley, NP 10/25/16 [place], NP 10/25/16 1444

## 2016-10-25 NOTE — ED Triage Notes (Signed)
The patient presented to the [facility] with a complaint of a laceration to her right thumb with a knife earlier today. The patient reported that her TDAp was current.

## 2016-10-25 NOTE — Discharge Instructions (Signed)
Keep wound dry and clean. Keep dressing x 48 hrs. After 48 hrs wash with soap and water and keep covered with dry breathable gauze.  Suture removal x 7-10 days.

## 2016-11-01 ENCOUNTER — Ambulatory Visit (INDEPENDENT_AMBULATORY_CARE_PROVIDER_SITE_OTHER): Payer: Commercial Managed Care - HMO | Admitting: *Deleted

## 2016-11-01 DIAGNOSIS — Z4802 Encounter for removal of sutures: Secondary | ICD-10-CM | POA: Diagnosis not present

## 2016-11-01 NOTE — Progress Notes (Signed)
   Patient in nurse clinic for suture removal of right hand thumb area.  Patient cut her thumb and hand with a knife on 10/25/2016. Sutures were placed by urgent care.  Area prepped with betadine and alcohol swabs; 5 mattress and 1 simple interrupted placed and 5 mattress and 1 simple interrupted removed with help of Dr. Gwendlyn Deutscher without difficultly.  Patient advised to return if area is swollen, red and hot to touch. Pt to follow up as needed.  Derl Barrow, RN

## 2017-01-11 DIAGNOSIS — H02831 Dermatochalasis of right upper eyelid: Secondary | ICD-10-CM | POA: Diagnosis not present

## 2017-01-11 DIAGNOSIS — H2513 Age-related nuclear cataract, bilateral: Secondary | ICD-10-CM | POA: Diagnosis not present

## 2017-01-11 DIAGNOSIS — H40053 Ocular hypertension, bilateral: Secondary | ICD-10-CM | POA: Diagnosis not present

## 2017-01-11 DIAGNOSIS — H02834 Dermatochalasis of left upper eyelid: Secondary | ICD-10-CM | POA: Diagnosis not present

## 2017-02-22 ENCOUNTER — Other Ambulatory Visit: Payer: Self-pay | Admitting: *Deleted

## 2017-02-22 MED ORDER — SIMVASTATIN 20 MG PO TABS: 20.0000 mg | ORAL_TABLET | Freq: Every evening | ORAL | 0 refills | Status: DC

## 2017-02-22 MED ORDER — HYDROCHLOROTHIAZIDE 25 MG PO TABS: 12.5000 mg | ORAL_TABLET | Freq: Every day | ORAL | 0 refills | Status: DC

## 2017-03-24 ENCOUNTER — Ambulatory Visit (INDEPENDENT_AMBULATORY_CARE_PROVIDER_SITE_OTHER): Payer: Medicare HMO | Admitting: Family Medicine

## 2017-03-24 ENCOUNTER — Ambulatory Visit
Admission: RE | Admit: 2017-03-24 | Discharge: 2017-03-24 | Disposition: A | Discharge status: Home / Self Care | Payer: Medicare HMO | Source: Ambulatory Visit | Attending: Family Medicine | Admitting: Family Medicine

## 2017-03-24 VITALS — BP 154/76 | HR 73 | Temp 98.1°F | Ht 63.0 in | Wt 146.6 lb

## 2017-03-24 DIAGNOSIS — M545 Low back pain, unspecified: Secondary | ICD-10-CM

## 2017-03-24 DIAGNOSIS — M5136 Other intervertebral disc degeneration, lumbar region: Secondary | ICD-10-CM | POA: Diagnosis not present

## 2017-03-24 MED ORDER — BACLOFEN 10 MG PO TABS: 5.0000 mg | ORAL_TABLET | Freq: Three times a day (TID) | ORAL | 0 refills | Status: DC | PRN

## 2017-03-24 MED FILL — BACLOFEN 10 MG TABLET: 10 | 13 days supply | Qty: 20 | Fill #0

## 2017-03-24 NOTE — Patient Instructions (Signed)
Go get the xray Baclofen 1/2 a pill up every 8 hours as needed - use caution as it might make you sleepy.  Return in 3-4 weeks, sooner if worsening. Come back ASAP if trouble urinating/stooling, numbness/weakness in legs, numbness crotch area, or fevers.  Be well, Dr. Ardelia Mems

## 2017-03-24 NOTE — Progress Notes (Signed)
Date of Visit: 03/24/2017   HPI:  Patient presents for a same day appointment to discuss back pain. It is my first time meeting her, she was recently assigned to me when her prior PCP Dr. Ree Kida left of practice.  Has history of prior back surgeries (4 times), most recently 4 years ago by Dr. Sherwood Gambler. Over the last week has had right sided low back pain, radiating to the right hip. No preceding injuries. Some mild difficulty using right leg related to back pain, NOT related to weakness. Denies having fever, saddle anesthesia, true lower extremity weakness, or bowel/bladder incontinence. Has had some urinary urgency over the last week but no incontinence, able to control stream. Has tried ibuprofen with some relief. Nothing else really aggravates or alleviates pain.  ROS: See HPI  [place]: history of hypertension, AK, osteoporosis, hyperlipidemia, subclinical hyperthyroidism, cold thyroid nodule, hyperlipidemia, lumbar spinal stenosis  PHYSICAL EXAM: BP (!) 154/76 (BP Location: Right Arm, Patient Position: Sitting, Cuff Size: Normal)   Pulse 73   Temp 98.1 F (36.7 C) (Oral)   Ht 5\' 3"  (1.6 m)   Wt 146 lb 9.6 oz (66.5 kg)   SpO2 97%   BMI 25.97 kg/m  Gen: no acute distress, pleasant, cooperative Back: mild mid-back tenderness to palpation in lumbar area over bony spine. Prior surgical scar present midline. Primary focus of pain is in R paraspinal musculature of lumbar spine. Thoracic kyphosis present Extremities: sensation intact over bilateral lower extremities. Full strength bilateral lower extremities with ankle plantar/dorsiflexion, knee flexion/extension, and hip flexion. Full ROM of internal & external rotation of bilateral hips.   ASSESSMENT/PLAN:  1. Back pain - suspect related to paraspinal muscle spasm, though with history of osteoporosis and midline tenderness want to rule out compression fracture. No red flags to warrant emergent advanced imaging.  Plan: - xray lumbar spine -  baclofen three times daily as needed - counseled on risks of sedation - apply heat/ice as needed to the area - follow up 3-4 weeks, sooner if worsening - red flags reviewed per after visit summary - if xray unremarkable may consider physical therapy for low back strengthening - if persistent may need to see back surgeon again - patient & husband agreeable to this plan  FOLLOW UP: Follow up in 3-4 weeks for back pain  [place] J. Ardelia Mems, [place]

## 2017-03-25 ENCOUNTER — Telehealth: Payer: Self-pay | Admitting: *Deleted

## 2017-03-25 NOTE — Telephone Encounter (Signed)
Returned call and spoke with patient & husband Reviewed xray results with them, which showed progression of degenerative disc disease but  Fortunately no fractures  Still having lots of pain without much relief from baclofen Patient reports she does ok taking nsaids Will cautiously recommend 400mg  ibuprofen q8h for pain  Follow up if not improving Patient appreciative  Leeanne Rio, MD

## 2017-03-25 NOTE — Telephone Encounter (Signed)
Received message on nurse line from Demesha Boorman (husband) regarding patient. Asking for return call from PCP regarding recent x-ray. States patient not doing well. Hubbard Hartshorn, RN, BSN

## 2017-04-20 ENCOUNTER — Other Ambulatory Visit: Payer: Self-pay | Admitting: *Deleted

## 2017-04-22 MED ORDER — ALENDRONATE SODIUM 70 MG PO TABS: 70.0000 mg | ORAL_TABLET | ORAL | 5 refills | Status: DC

## 2017-04-22 NOTE — Telephone Encounter (Signed)
2nd request.  Darci Lykins L, RN  

## 2017-04-27 ENCOUNTER — Other Ambulatory Visit: Payer: Self-pay | Admitting: Family Medicine

## 2017-07-04 ENCOUNTER — Other Ambulatory Visit: Payer: Self-pay | Admitting: Family Medicine

## 2017-07-04 MED ORDER — HYDROCHLOROTHIAZIDE 25 MG PO TABS: 12.5000 mg | ORAL_TABLET | Freq: Every day | ORAL | 0 refills | Status: DC

## 2017-07-04 MED ORDER — SIMVASTATIN 20 MG PO TABS: 20.0000 mg | ORAL_TABLET | Freq: Every evening | ORAL | 0 refills | Status: DC

## 2017-07-28 ENCOUNTER — Encounter: Payer: Self-pay | Admitting: Family Medicine

## 2017-07-28 ENCOUNTER — Other Ambulatory Visit: Payer: Self-pay

## 2017-07-28 ENCOUNTER — Ambulatory Visit (INDEPENDENT_AMBULATORY_CARE_PROVIDER_SITE_OTHER): Payer: Medicare HMO | Admitting: Family Medicine

## 2017-07-28 VITALS — BP 144/82 | HR 70 | Temp 97.8°F | Ht 63.0 in | Wt 147.7 lb

## 2017-07-28 DIAGNOSIS — E0590 Thyrotoxicosis, unspecified without thyrotoxic crisis or storm: Secondary | ICD-10-CM | POA: Diagnosis not present

## 2017-07-28 DIAGNOSIS — I10 Essential (primary) hypertension: Secondary | ICD-10-CM

## 2017-07-28 DIAGNOSIS — R1310 Dysphagia, unspecified: Secondary | ICD-10-CM

## 2017-07-28 DIAGNOSIS — L570 Actinic keratosis: Secondary | ICD-10-CM | POA: Diagnosis not present

## 2017-07-28 DIAGNOSIS — M810 Age-related osteoporosis without current pathological fracture: Secondary | ICD-10-CM | POA: Diagnosis not present

## 2017-07-28 DIAGNOSIS — M8580 Other specified disorders of bone density and structure, unspecified site: Secondary | ICD-10-CM | POA: Diagnosis not present

## 2017-07-28 NOTE — Patient Instructions (Addendum)
Checking labs today. Will call or send letter with results. Updating your bone density test.  Schedule an appointment in the dermatology procedures clinic here at the [facility] for the spots on your face.  Setting you up for a swallowing evaluation.  Follow up with me in 3 months.  Be well, Dr. Ardelia Mems   Health Maintenance for Postmenopausal Women Menopause is a normal process in which your reproductive ability comes to an end. This process happens gradually over a span of months to years, usually between the ages of 47 and 67. Menopause is complete when you have missed 12 consecutive menstrual periods. It is important to talk with your health care provider about some of the most common conditions that affect postmenopausal women, such as heart disease, cancer, and bone loss (osteoporosis). Adopting a healthy lifestyle and getting preventive care can help to promote your health and wellness. Those actions can also lower your chances of developing some of these common conditions. What should I know about menopause? During menopause, you may experience a number of symptoms, such as:  Moderate-to-severe hot flashes.  Night sweats.  Decrease in sex drive.  Mood swings.  Headaches.  Tiredness.  Irritability.  Memory problems.  Insomnia.  Choosing to treat or not to treat menopausal changes is an individual decision that you make with your health care provider. What should I know about hormone replacement therapy and supplements? Hormone therapy products are effective for treating symptoms that are associated with menopause, such as hot flashes and night sweats. Hormone replacement carries certain risks, especially as you become older. If you are thinking about using estrogen or estrogen with progestin treatments, discuss the benefits and risks with your health care provider. What should I know about heart disease and stroke? Heart disease, heart attack, and stroke  become more likely as you age. This may be due, in part, to the hormonal changes that your body experiences during menopause. These can affect how your body processes dietary fats, triglycerides, and cholesterol. Heart attack and stroke are both medical emergencies. There are many things that you can do to help prevent heart disease and stroke:  Have your blood pressure checked at least every 1-2 years. High blood pressure causes heart disease and increases the risk of stroke.  If you are 49-74 years old, ask your health care provider if you should take aspirin to prevent a heart attack or a stroke.  Do not use any tobacco products, including cigarettes, chewing tobacco, or electronic cigarettes. If you need help quitting, ask your health care provider.  It is important to eat a healthy diet and maintain a healthy weight. ? Be sure to include plenty of vegetables, fruits, low-fat dairy products, and lean protein. ? Avoid eating foods that are high in solid fats, added sugars, or salt (sodium).  Get regular exercise. This is one of the most important things that you can do for your health. ? Try to exercise for at least 150 minutes each week. The type of exercise that you do should increase your heart rate and make you sweat. This is known as moderate-intensity exercise. ? Try to do strengthening exercises at least twice each week. Do these in addition to the moderate-intensity exercise.  Know your numbers.Ask your health care provider to check your cholesterol and your blood glucose. Continue to have your blood tested as directed by your health care provider.  What should I know about cancer screening? There are several types of cancer. Take the  following steps to reduce your risk and to catch any cancer development as early as possible. Breast Cancer  Practice breast self-awareness. ? This means understanding how your breasts normally appear and feel. ? It also means doing regular breast  self-exams. Let your health care provider know about any changes, no matter how small.  If you are 68 or older, have a clinician do a breast exam (clinical breast exam or CBE) every year. Depending on your age, family history, and medical history, it may be recommended that you also have a yearly breast X-ray (mammogram).  If you have a family history of breast cancer, talk with your health care provider about genetic screening.  If you are at high risk for breast cancer, talk with your health care provider about having an MRI and a mammogram every year.  Breast cancer (BRCA) gene test is recommended for women who have family members with BRCA-related cancers. Results of the assessment will determine the need for genetic counseling and BRCA1 and for BRCA2 testing. BRCA-related cancers include these types: ? Breast. This occurs in males or females. ? Ovarian. ? Tubal. This may also be called fallopian tube cancer. ? Cancer of the abdominal or pelvic lining (peritoneal cancer). ? Prostate. ? Pancreatic.  Cervical, Uterine, and Ovarian Cancer Your health care provider may recommend that you be screened regularly for cancer of the pelvic organs. These include your ovaries, uterus, and vagina. This screening involves a pelvic exam, which includes checking for microscopic changes to the surface of your cervix (Pap test).  For women ages 21-65, health care providers may recommend a pelvic exam and a Pap test every three years. For women ages 29-65, they may recommend the Pap test and pelvic exam, combined with testing for human papilloma virus (HPV), every five years. Some types of HPV increase your risk of cervical cancer. Testing for HPV may also be done on women of any age who have unclear Pap test results.  Other health care providers may not recommend any screening for nonpregnant women who are considered low risk for pelvic cancer and have no symptoms. Ask your health care provider if a screening  pelvic exam is right for you.  If you have had past treatment for cervical cancer or a condition that could lead to cancer, you need Pap tests and screening for cancer for at least 20 years after your treatment. If Pap tests have been discontinued for you, your risk factors (such as having a new sexual partner) need to be reassessed to determine if you should start having screenings again. Some women have medical problems that increase the chance of getting cervical cancer. In these cases, your health care provider may recommend that you have screening and Pap tests more often.  If you have a family history of uterine cancer or ovarian cancer, talk with your health care provider about genetic screening.  If you have vaginal bleeding after reaching menopause, tell your health care provider.  There are currently no reliable tests available to screen for ovarian cancer.  Lung Cancer Lung cancer screening is recommended for adults 81-76 years old who are at high risk for lung cancer because of a history of smoking. A yearly low-dose CT scan of the lungs is recommended if you:  Currently smoke.  Have a history of at least 30 pack-years of smoking and you currently smoke or have quit within the past 15 years. A pack-year is smoking an average of one pack of cigarettes per day  for one year.  Yearly screening should:  Continue until it has been 15 years since you quit.  Stop if you develop a health problem that would prevent you from having lung cancer treatment.  Colorectal Cancer  This type of cancer can be detected and can often be prevented.  Routine colorectal cancer screening usually begins at age 48 and continues through age 70.  If you have risk factors for colon cancer, your health care provider may recommend that you be screened at an earlier age.  If you have a family history of colorectal cancer, talk with your health care provider about genetic screening.  Your health care  provider may also recommend using home test kits to check for hidden blood in your stool.  A small camera at the end of a tube can be used to examine your colon directly (sigmoidoscopy or colonoscopy). This is done to check for the earliest forms of colorectal cancer.  Direct examination of the colon should be repeated every 5-10 years until age 50. However, if early forms of precancerous polyps or small growths are found or if you have a family history or genetic risk for colorectal cancer, you may need to be screened more often.  Skin Cancer  Check your skin from head to toe regularly.  Monitor any moles. Be sure to tell your health care provider: ? About any new moles or changes in moles, especially if there is a change in a mole's shape or color. ? If you have a mole that is larger than the size of a pencil eraser.  If any of your family members has a history of skin cancer, especially at a young age, talk with your health care provider about genetic screening.  Always use sunscreen. Apply sunscreen liberally and repeatedly throughout the day.  Whenever you are outside, protect yourself by wearing long sleeves, pants, a wide-brimmed hat, and sunglasses.  What should I know about osteoporosis? Osteoporosis is a condition in which bone destruction happens more quickly than new bone creation. After menopause, you may be at an increased risk for osteoporosis. To help prevent osteoporosis or the bone fractures that can happen because of osteoporosis, the following is recommended:  If you are 53-15 years old, get at least 1,000 mg of calcium and at least 600 mg of vitamin D per day.  If you are older than age 40 but younger than age 21, get at least 1,200 mg of calcium and at least 600 mg of vitamin D per day.  If you are older than age 33, get at least 1,200 mg of calcium and at least 800 mg of vitamin D per day.  Smoking and excessive alcohol intake increase the risk of osteoporosis. Eat  foods that are rich in calcium and vitamin D, and do weight-bearing exercises several times each week as directed by your health care provider. What should I know about how menopause affects my mental health? Depression may occur at any age, but it is more common as you become older. Common symptoms of depression include:  Low or sad mood.  Changes in sleep patterns.  Changes in appetite or eating patterns.  Feeling an overall lack of motivation or enjoyment of activities that you previously enjoyed.  Frequent crying spells.  Talk with your health care provider if you think that you are experiencing depression. What should I know about immunizations? It is important that you get and maintain your immunizations. These include:  Tetanus, diphtheria, and pertussis (Tdap)  booster vaccine.  Influenza every year before the flu season begins.  Pneumonia vaccine.  Shingles vaccine.  Your health care provider may also recommend other immunizations. This information is not intended to replace advice given to you by your health care provider. Make sure you discuss any questions you have with your health care provider. Document Released: 09/10/2005 Document Revised: 02/06/2016 Document Reviewed: 04/22/2015 Elsevier Interactive Patient Education  2018 [employer].

## 2017-07-28 NOTE — Progress Notes (Signed)
Date of Visit: 07/28/2017   HPI:  Patient presents today for a well woman exam.   Concerns today: spots on face, issues with swallowing (see below) Exercise: walks regularly Smoking: no Alcohol: no Drugs: no Mood: no concerns for depression or mood issues  Patient and her husband are concerned she may be aspirating when swallowing. When she is drinking liquids and not paying attention, she ends up coughing a lot. Happening nearly every day. Had EGD in the past. Does not occur every time she drinks liquids. She takes alendronate for osteoporosis and reports she is able to get this down fine, likely because she is very focused on it and drinking the full glass of water with it.  Spots on face - has areas of thicker dry skin concerning for AK's on her right temple, forehead, and left cheek. Wants to know if she can have these treated here.  Hypertension  - taking hctz 12.5mg  daily. No chest pain or shortness of breath.   ROS: See HPI. ROS sheet positive for:  - ringing in ears - only when she wore hearing aid, which she's stopped - swelling of foot - left ankle chronically swollen since a sprain - cough - when she swallows, see above - frequent urination - has history of bladder sling for urinary incontinence  PMFSH: history of hypertension, AK's, osteoporosis, glaucoma, subclinical hyperthyroidism, hyperlipidemia, chronic venous insufficiency, spinal stenosis  PHYSICAL EXAM: BP (!) 144/82   Pulse 70   Temp 97.8 F (36.6 C) (Oral)   Ht 5\' 3"  (1.6 m)   Wt 147 lb 11.2 oz (67 kg)   SpO2 94%   BMI 26.16 kg/m   144/82 Gen: NAD, pleasant, cooperative HEENT: NCAT, no palpable thyromegaly or anterior cervical lymphadenopathy. Tympanic membranes clear bilaterally. Heart: RRR, no murmurs Lungs: CTAB, NWOB Abdomen: soft, nontender to palpation Neuro: grossly nonfocal, speech normal Extremities: chronic stable edema of left ankle  ASSESSMENT/PLAN:  # Health maintenance:   -immunizations: current -handout given on health maintenance topics  Osteoporosis Will update DEXA scan in January. Patient assures me that despite her swallowing issues she is able to get the alendronate down safely. Continue this medication for now.  HYPERTENSION, BENIGN SYSTEMIC At goal. Continue current regimen.  AK (actinic keratosis) Advised scheduling in dermatology procedures clinic for treatment  Subclinical hyperthyroidism Update TSH today  Dysphagia Ordered modified barium swallow May need ENT or GI referral, but will await MBS for now.   FOLLOW UP: Follow up in 3 mos for above issues Schedule in derm procedures clinic  Amanda Shaw, [place]

## 2017-07-29 LAB — TSH: TSH: 0.26 u[IU]/mL — AB (ref 0.450–4.500)

## 2017-07-29 LAB — CMP14+EGFR
ALBUMIN: 4.4 g/dL (ref 3.5–4.7)
ALK PHOS: 56 IU/L (ref 39–117)
ALT: 16 IU/L (ref 0–32)
AST: 26 IU/L (ref 0–40)
Albumin/Globulin Ratio: 1.5 (ref 1.2–2.2)
BILIRUBIN TOTAL: 0.4 mg/dL (ref 0.0–1.2)
BUN / CREAT RATIO: 24 (ref 12–28)
BUN: 21 mg/dL (ref 8–27)
CHLORIDE: 101 mmol/L (ref 96–106)
CO2: 28 mmol/L (ref 20–29)
CREATININE: 0.87 mg/dL (ref 0.57–1.00)
Calcium: 10.4 mg/dL — ABNORMAL HIGH (ref 8.7–10.3)
GFR calc Af Amer: 70 mL/min/{1.73_m2} (ref >59)
GFR calc non Af Amer: 61 mL/min/{1.73_m2} (ref >59)
GLUCOSE: 100 mg/dL — AB (ref 65–99)
Globulin, Total: 3 g/dL (ref 1.5–4.5)
Potassium: 4.8 mmol/L (ref 3.5–5.2)
Sodium: 143 mmol/L (ref 134–144)
Total Protein: 7.4 g/dL (ref 6.0–8.5)

## 2017-07-29 LAB — LIPID PANEL
CHOLESTEROL TOTAL: 156 mg/dL (ref 100–199)
Chol/HDL Ratio: 2.7 ratio (ref 0.0–4.4)
HDL: 58 mg/dL (ref >39)
LDL CALC: 72 mg/dL (ref 0–99)
TRIGLYCERIDES: 129 mg/dL (ref 0–149)
VLDL CHOLESTEROL CAL: 26 mg/dL (ref 5–40)

## 2017-08-01 DIAGNOSIS — R1310 Dysphagia, unspecified: Secondary | ICD-10-CM | POA: Insufficient documentation

## 2017-08-01 NOTE — Assessment & Plan Note (Signed)
Advised scheduling in dermatology procedures clinic for treatment

## 2017-08-01 NOTE — Assessment & Plan Note (Signed)
Will update DEXA scan in January. Patient assures me that despite her swallowing issues she is able to get the alendronate down safely. Continue this medication for now.

## 2017-08-01 NOTE — Assessment & Plan Note (Signed)
Ordered modified barium swallow May need ENT or GI referral, but will await MBS for now.

## 2017-08-01 NOTE — Assessment & Plan Note (Signed)
At goal. Continue current regimen. 

## 2017-08-01 NOTE — Assessment & Plan Note (Signed)
Update TSH today. 

## 2017-08-04 ENCOUNTER — Inpatient Hospital Stay: Admission: RE | Admit: 2017-08-04 | Payer: Medicare HMO | Source: Ambulatory Visit

## 2017-08-05 ENCOUNTER — Telehealth: Payer: Self-pay | Admitting: *Deleted

## 2017-08-05 NOTE — Telephone Encounter (Signed)
-----   Message from Leeanne Rio, MD sent at 08/01/2017  1:18 PM EST ----- I ordered a modified barium swallow for this patient. Can you check to see whether we need to schedule it, or if the radiology/speech therapy people automatically schedule it?  Thanks! [place]

## 2017-08-05 NOTE — Telephone Encounter (Signed)
Scheduled appt for pt, pt and husband informed. Jahnyla Parrillo Kennon Holter, CMA

## 2017-08-12 ENCOUNTER — Other Ambulatory Visit: Payer: Self-pay | Admitting: Family Medicine

## 2017-08-12 ENCOUNTER — Telehealth: Payer: Self-pay | Admitting: Family Medicine

## 2017-08-12 ENCOUNTER — Other Ambulatory Visit (HOSPITAL_COMMUNITY): Payer: Self-pay | Admitting: Family Medicine

## 2017-08-12 DIAGNOSIS — R7989 Other specified abnormal findings of blood chemistry: Secondary | ICD-10-CM

## 2017-08-12 DIAGNOSIS — R7301 Impaired fasting glucose: Secondary | ICD-10-CM

## 2017-08-12 DIAGNOSIS — R1310 Dysphagia, unspecified: Secondary | ICD-10-CM

## 2017-08-12 NOTE — Telephone Encounter (Signed)
Called patient to review labwork: - lipids look good - calcium very mildly elevated, likely due to Ca supplementation and being on HCTZ. Will recheck next week - glucose technically elevated at 100 while fasting, will check A1c - TSH low, will repeat & check T3 and free T4  Lab visit scheduled for next Friday morning. Patient appreciative.  Leeanne Rio, MD

## 2017-08-16 ENCOUNTER — Other Ambulatory Visit: Payer: Medicare HMO

## 2017-08-16 ENCOUNTER — Ambulatory Visit
Admission: RE | Admit: 2017-08-16 | Discharge: 2017-08-16 | Disposition: A | Discharge status: Home / Self Care | Payer: Medicare HMO | Source: Ambulatory Visit | Attending: Family Medicine | Admitting: Family Medicine

## 2017-08-16 DIAGNOSIS — Z780 Asymptomatic menopausal state: Secondary | ICD-10-CM | POA: Diagnosis not present

## 2017-08-16 DIAGNOSIS — M8589 Other specified disorders of bone density and structure, multiple sites: Secondary | ICD-10-CM | POA: Diagnosis not present

## 2017-08-16 DIAGNOSIS — M8580 Other specified disorders of bone density and structure, unspecified site: Secondary | ICD-10-CM

## 2017-08-17 ENCOUNTER — Ambulatory Visit (HOSPITAL_COMMUNITY)
Admission: RE | Admit: 2017-08-17 | Discharge: 2017-08-17 | Disposition: A | Discharge status: Home / Self Care | Payer: Medicare HMO | Source: Ambulatory Visit | Attending: Family Medicine | Admitting: Family Medicine

## 2017-08-17 DIAGNOSIS — R1310 Dysphagia, unspecified: Secondary | ICD-10-CM | POA: Insufficient documentation

## 2017-08-17 DIAGNOSIS — R1312 Dysphagia, oropharyngeal phase: Secondary | ICD-10-CM | POA: Diagnosis not present

## 2017-08-17 DIAGNOSIS — R05 Cough: Secondary | ICD-10-CM | POA: Diagnosis not present

## 2017-08-17 NOTE — Progress Notes (Signed)
Modified Barium Swallow Progress Note  Patient Details  Name: [name] MRN: [id number] Date of Birth: [birth year]  Today's Date: 08/17/2017  Modified Barium Swallow completed.  Full report located under Chart Review in the Imaging Section.  Brief recommendations include the following:  Clinical Impression  Pt demonstrates oral and pharyngeal function WNL for age with trace lingual residuals, occasional instances of premature spillage or initiation of swallow with liquid arriving in pyriforms. No penetration or aspiration occurred, but would not be unexpected for pt to have penetration events before or after swallow intermittently. Pt reports independently reducing rate and bolus size in the recent past with subjective improvement in function. Encouraged these basic aspiration precautions without any diet modification or SLP f/u needed.    Swallow Evaluation Recommendations       SLP Diet Recommendations: Regular solids;Thin liquid   Liquid Administration via: Cup   Medication Administration: Whole meds with liquid   Supervision: Patient able to self feed   Compensations: Slow rate;Small sips/bites;Multiple dry swallows after each bite/sip   Postural Changes: Remain semi-upright after after feeds/meals (Comment)            Freddye Cardamone, Katherene Ponto 08/17/2017,1:50 PM

## 2017-08-19 ENCOUNTER — Other Ambulatory Visit (INDEPENDENT_AMBULATORY_CARE_PROVIDER_SITE_OTHER): Payer: Medicare HMO

## 2017-08-19 DIAGNOSIS — R7301 Impaired fasting glucose: Secondary | ICD-10-CM

## 2017-08-19 DIAGNOSIS — R7989 Other specified abnormal findings of blood chemistry: Secondary | ICD-10-CM | POA: Diagnosis not present

## 2017-08-19 LAB — POCT GLYCOSYLATED HEMOGLOBIN (HGB A1C): HEMOGLOBIN A1C: 5.5

## 2017-08-20 LAB — BASIC METABOLIC PANEL
BUN/Creatinine Ratio: 20 (ref 12–28)
BUN: 19 mg/dL (ref 8–27)
CALCIUM: 9.4 mg/dL (ref 8.7–10.3)
CHLORIDE: 100 mmol/L (ref 96–106)
CO2: 25 mmol/L (ref 20–29)
Creatinine, Ser: 0.97 mg/dL (ref 0.57–1.00)
GFR calc non Af Amer: 53 mL/min/{1.73_m2} — ABNORMAL LOW (ref >59)
GFR, EST AFRICAN AMERICAN: 62 mL/min/{1.73_m2} (ref >59)
Glucose: 100 mg/dL — ABNORMAL HIGH (ref 65–99)
POTASSIUM: 4.2 mmol/L (ref 3.5–5.2)
Sodium: 140 mmol/L (ref 134–144)

## 2017-08-20 LAB — TSH: TSH: 0.504 u[IU]/mL (ref 0.450–4.500)

## 2017-08-20 LAB — T4, FREE: FREE T4: 1.02 ng/dL (ref 0.82–1.77)

## 2017-08-20 LAB — T3: T3, Total: 152 ng/dL (ref 71–180)

## 2017-09-01 ENCOUNTER — Ambulatory Visit (INDEPENDENT_AMBULATORY_CARE_PROVIDER_SITE_OTHER): Payer: Medicare HMO | Admitting: Family Medicine

## 2017-09-01 ENCOUNTER — Other Ambulatory Visit: Payer: Self-pay

## 2017-09-01 VITALS — BP 130/80 | HR 72 | Temp 97.6°F | Wt 148.0 lb

## 2017-09-01 DIAGNOSIS — C44320 Squamous cell carcinoma of skin of unspecified parts of face: Secondary | ICD-10-CM

## 2017-09-01 DIAGNOSIS — L858 Other specified epidermal thickening: Secondary | ICD-10-CM

## 2017-09-01 DIAGNOSIS — C44329 Squamous cell carcinoma of skin of other parts of face: Secondary | ICD-10-CM | POA: Diagnosis not present

## 2017-09-01 DIAGNOSIS — L570 Actinic keratosis: Secondary | ICD-10-CM

## 2017-09-01 NOTE — Progress Notes (Signed)
Subjective:    Patient ID: Amanda Shaw, female    DOB: [birth year], 84 y.o.   MRN: [id number]   CC: Multiple skin lesions   HPI: Patient is a 84 yo female who presents today complaining of multiple skin lesions on her face, ears, neck and arm. Patient reports that lesions have been present for a few years.  Patient reports skin lesion on nose and back a few years ago that have required excision.  Both lesions were benign.  Patient denies any history of skin cancer.  Patient is mostly bothered by right temporal lesion and left earlobe lesion. Patient reports scratching right temporal lesion and reports scaliness.  Smoking status reviewed   ROS: all other systems were reviewed and are negative other than in the HPI   Past Medical History:  Diagnosis Date  . Arthritis    arthritis in hips   . Fracture of ankle, left, closed 10/1998  . Gastritis 07/2006   by endoscopy  . HEMORRHOIDS, NOS 09/29/2006   Qualifier: Diagnosis of  By: Walker Kehr MD, Patrick Jupiter    . Hypertension   . Subungual hematoma of right foot 02/18/2015   Right great toe     Past Surgical History:  Procedure Laterality Date  . APPENDECTOMY    . BACK SURGERY    . BLADDER SUSPENSION  01/2004  . CHOLECYSTECTOMY  1987  . CRYOABLATION     bridge of nose, Dr Ronnald Ramp  . EXCISION/RELEASE BURSA HIP  09/22/2011   Procedure: EXCISION/RELEASE BURSA HIP;  Surgeon: Gearlean Alf, MD;  Location: [facility]S;  Service: Orthopedics;  Laterality: Right;  right hip bursectomy   . LUMBAR [place] SURGERY  [phone]  . [place] INJECTION Bilateral 2010  . TONSILLECTOMY  1956  . TRABECULECTOMY Bilateral    laser  . VAGINAL HYSTERECTOMY  1988   benign disease    Past medical history, surgical, family, and social history reviewed and updated in the EMR as appropriate.  Objective:  BP 130/80   Pulse 72   Temp 97.6 F (36.4 C) (Oral)   Wt 148 lb (67.1 kg)   SpO2 96%   BMI 26.22 kg/m          Vitals and nursing note  reviewed  General: NAD, pleasant, able to participate in exam Cardiac: RRR, normal heart sounds, no murmurs. 2+ radial and PT pulses bilaterally Respiratory: CTAB, normal effort, No wheezes, rales or rhonchi Abdomen: soft, nontender, nondistended, no hepatic or splenomegaly, +BS Extremities: no edema or cyanosis. WWP. Skin: Right temporal hyperkeratotic lesion with hornlike appearance nontender, scaly, nonerythematous.  Left earlobe small lesion scaly no erythema well-circumscribed. Neuro: alert and oriented x4, no focal deficits Psych: Normal affect and mood   Assessment & Plan:    Skin Biopsy Procedure Note PRE-OP DIAGNOSIS: Actinic Keratosis POST-OP DIAGNOSIS: Same  PROCEDURE: skin biopsy  Performing Physician: [place] PROCEDURE:  Excisional Biopsy  The area surrounding the skin lesion was prepared and draped in the usual sterile manner. The lesion was removed in the usual manner by the biopsy method noted above. Hemostasis was assured. The patient tolerated the procedure well.   Followup: The patient tolerated the procedure well without complications. Standard post-procedure care is explained and return precautions are given.  Patient will return in 7 days for suture removal.  #Right temporal lesion, chronic Exam findings are consistent with actinic keratosis with hornlike appearance.  Lesion was excised and will be sent to pathology.  See note above.  Minimal concern for malignancy.  Lesion likely secondary to normal skin age related changes given extensive exposure to sun.  Wound care handout given to patient.  #Left earlobe lesion Small earlobe lesion, scaly and nonerythematous slightly raised consistent with actinic keratosis.  Cryotherapy procedure performed.  Patient tolerated procedure well.  We will follow-up in a few weeks.    Marjie Skiff, MD [place] PGY-2

## 2017-09-01 NOTE — Patient Instructions (Signed)
Sutured Wound Care Sutures are stitches that can be used to close wounds. Taking care of your wound properly can help prevent pain and infection. It can also help your wound to heal more quickly. How is this treated? Wound Care  Keep the wound clean and dry.  If you were given a bandage (dressing), change it at least one time per day or as told by your doctor. You should also change it if it gets wet or dirty.  Keep the wound completely dry for the first 24 hours or as told by your doctor. After that time, you may shower or bathe. However, make sure that the wound is not soaked in water until the sutures have been removed.  Clean the wound one time each day or as told by your doctor. ? Wash the wound with soap and water. ? Rinse the wound with water to remove all soap. ? Pat the wound dry with a clean towel. Do not rub the wound.  After cleaning the wound, put a thin layer of antibiotic ointment on it as told your doctor. This ointment: ? Helps to prevent infection. ? Keeps the bandage from sticking to the wound.  Have the sutures removed as told by your doctor. General Instructions  Take or apply medicines only as told by your doctor.  To help prevent scarring, make sure to cover your wound with sunscreen whenever you are outside after the sutures are removed and the wound is healed. Make sure to wear a sunscreen of at least 30 SPF.  If you were prescribed an antibiotic medicine or ointment, finish all of it even if you start to feel better.  Do not scratch or pick at the wound.  Keep all follow-up visits as told by your doctor. This is important.  Check your wound every day for signs of infection. Watch for: ? Redness, swelling, or pain. ? Fluid, blood, or pus.  Raise (elevate) the injured area above the level of your heart while you are sitting or lying down, if possible.  Avoid stretching your wound.  Drink enough fluids to keep your pee (urine) clear or pale  yellow. Contact a doctor if:  You were given a tetanus shot and you have any of these where the needle went in: ? Swelling. ? Very bad pain. ? Redness. ? Bleeding.  You have a fever.  A wound that was closed breaks open.  You notice a bad smell coming from the wound.  You notice something coming out of the wound, such as wood or glass.  Medicine does not help your pain.  You have any of these at the site of the wound. ? More redness. ? More swelling. ? More pain.  You have any of these coming from the wound. ? Fluid. ? Blood. ? Pus.  You notice a change in the color of your skin near the wound.  You need to change the bandage often due to fluid, blood, or pus coming from the wound.  You have a new rash.  You have numbness around the wound. Get help right away if:  You have very bad swelling around the wound.  Your pain suddenly gets worse and is very bad.  You have painful lumps near the wound or on skin that is anywhere on your body.  You have a red streak going away from the wound.  The wound is on your hand or foot and you cannot move a finger or toe like normal.  The   wound is on your hand or foot and you notice that your fingers or toes look pale or bluish. This information is not intended to replace advice given to you by your health care provider. Make sure you discuss any questions you have with your health care provider. Document Released: 01/05/2008 Document Revised: 12/25/2015 Document Reviewed: 02/28/2013 Elsevier Interactive Patient Education  2017 [employer]  

## 2017-09-01 NOTE — Progress Notes (Addendum)
Patient ID: Amanda Shaw, female   DOB: September 30, 1929, 84 y.o.   MRN: [id number]   Patient with multiple facial skin lesion.   Exam: 1cm by 1.5cm raised, circumferential lesion with small horn on her right temple. Multiple flat scaly slightly hyperpigmented lesion <0.5cm by 0.5 cm on her face, left ear lobe, right arm and forearm.  PRE-OP DIAGNOSIS: Actinic keratosis. Cutaneous horn on right temple  POST-OP DIAGNOSIS: Same   PROCEDURE: skin biopsy  Performing Physician: Tawanna Solo Elfa Wooton,MD performing excision biopsy                                      Marjie Skiff, MD performed cryotherapy of her left ear lobe lesion    PROCEDURE:  Excisional Biopsy    And Cryotherapy   The area surrounding the skin lesion was prepared and draped in the usual sterile manner. The lesion was removed in the usual manner by the biopsy method noted above with approximately 63mm margin area. Hemostasis was assured. The patient tolerated the procedure well.   Closure:    4-0 nylone  suture x 3 stiches. Bacitracin ointment applied and was covered with pressure dressing with 2X2 gauze.   Followup: The patient tolerated the procedure well without complications.  Standard post-procedure care is explained and return precautions are given.  Pathology report: Squamous Cell Carcinoma.

## 2017-09-05 ENCOUNTER — Other Ambulatory Visit: Payer: Self-pay | Admitting: Family Medicine

## 2017-09-07 ENCOUNTER — Telehealth: Payer: Self-pay | Admitting: Family Medicine

## 2017-09-07 NOTE — Telephone Encounter (Signed)
Pathology report reviewed:  Margin free, but I was concerned about the extension into the dermis. I called and spoke with the pathologist (Dr. Fletcher Anon) who confirmed to me that all affected tissues were excised.  I called and discussed result with the patient. She has F/U appointment tomorrow for suture removal. She is advised to have the nurse contact me so I can take a look at the incision. She agreed with plan and was appreciated. Derm f/u appointment scheduled for March.   Diagnosis Skin (M), right temple SUPERFICIALLY INVASIVE WELL DIFFERENTIATED SQUAMOUS CELL CARCINOMA, MARGINS FREE Microscopic Description There is an atypical squamous proliferation with parakeratosis and atypical squamous cells extending into the superficial dermis. The margins are free.

## 2017-09-08 ENCOUNTER — Ambulatory Visit (INDEPENDENT_AMBULATORY_CARE_PROVIDER_SITE_OTHER): Payer: Medicare HMO

## 2017-09-08 DIAGNOSIS — Z4802 Encounter for removal of sutures: Secondary | ICD-10-CM

## 2017-09-08 NOTE — Progress Notes (Signed)
Patient incision wound examined with the RN. Looks good without any sign of infection. I cleaned the area with alcohol and removed 3 sutures with surgical scissors without any complications. She tolerated procedure well.  Dressing: Apply topical bacitracin ointment.                 Cover with 2x2 guaze.   F/U as needed.

## 2017-09-08 NOTE — Progress Notes (Signed)
   Patient in to nurse clinic for suture removal. Dr. Gwendlyn Deutscher in to speak with patient and answer questions. 3 sutures removed by Dr. Gwendlyn Deutscher. Bacitracin ointment, gauze and bandaid applied. Ointment packs given to patient. No other questions. Danley Danker, RN [facility] RN)

## 2017-09-16 ENCOUNTER — Encounter: Payer: Self-pay | Admitting: Family Medicine

## 2017-09-16 ENCOUNTER — Other Ambulatory Visit: Payer: Self-pay

## 2017-09-16 ENCOUNTER — Ambulatory Visit (INDEPENDENT_AMBULATORY_CARE_PROVIDER_SITE_OTHER): Payer: Medicare HMO | Admitting: Family Medicine

## 2017-09-16 VITALS — BP 134/80 | HR 70 | Temp 97.4°F | Ht 63.0 in | Wt 149.4 lb

## 2017-09-16 DIAGNOSIS — Z8589 Personal history of malignant neoplasm of other organs and systems: Secondary | ICD-10-CM

## 2017-09-16 DIAGNOSIS — H6192 Disorder of left external ear, unspecified: Secondary | ICD-10-CM | POA: Diagnosis not present

## 2017-09-16 DIAGNOSIS — L570 Actinic keratosis: Secondary | ICD-10-CM

## 2017-09-16 NOTE — Progress Notes (Signed)
    Subjective:  Amanda Shaw is a 84 y.o. female who presents to the [facility] today with a chief complaint of skin lesions.   HPI Patient recently had squamous cell cancer removed by Dr. Gwendlyn Deutscher in derm clinic w/ pathology confirming clear borders.  She wants to ask about other lesions as well.   Her left ear lobe was treated with cryotherapy and she wants to know if that should be done again as it did not totally resolve.   She also wants to know about two pre-existing red/scaly lesions below the prior excision and a new red lesion along the hairline immediately superior to the lesion.  She thinks the new lesion might be related to tape damage from the bandage after the excision.  The lesions do not hurt, are not bleeding/draining and are not changing in size.  Objective:  Physical Exam: BP 134/80   Pulse 70   Temp (!) 97.4 F (36.3 C) (Oral)   Ht 5\' 3"  (1.6 m)   Wt 149 lb 6.4 oz (67.8 kg)   SpO2 97%   BMI 26.47 kg/m   Gen: NAD, resting comfortably CV: RRR with no murmurs appreciated Pulm: NWOB, CTAB with no crackles, wheezes, or rhonchi GI: Soft, Nontender, Nondistended. MSK: no edema, cyanosis, or clubbing noted Skin: warm, dry.   Lesion on left ear lobe smaller since prior cryotherapy treatment with no bleeding.   Right temple excision wound is clean and dry and healing as expected.   Two actinic keratosis lesions below right temple are app 1-2 cm in diameter red/flat/scaly.   The lesion above right temple along hairline is flat and not scaly with healed spotting consistent with damage from tape. Neuro: grossly normal, moves all extremities Psych: Normal affect and thought content  No results found for this or any previous visit (from the past 72 hour(s)).   Assessment/Plan:  History of squamous cell carcinoma Left temple excised with pathology confirming clear borders.  Healing well.  No new intervention required  Lesion of left external ear Treated 1x already with  cryotherapy.   Did not totally resolve and plan is to treat again at upcoming derm clinic appt.  AK (actinic keratosis) Still with 2 small lesions below right temple.  Current plan is likely cryotherapy at upcoming derm clinic appt.   Sherene Sires, [place] - PGY1 09/17/2017 4:34 PM

## 2017-09-16 NOTE — Patient Instructions (Signed)
It was a pleasure to see you today! Thank you for choosing [facility] for your primary care. Amanda Shaw was seen for lesions on face after cancer removal. Come back to the clinic for your scheduled dermatology appt, and go to the emergency room if you have any life threatening concerns.  Today we discussed how your wound is healing well and that 2 of the 3 red spots are likely actinic keratosis which you can have treated at your next visit.   The red spot above the wound is likely just damage from the tape.   The bump on your left ear can be treated again with cryotherapy at your derm visit.  If we did any lab work today, and the results require attention, either me or my nurse will get in touch with you. If everything is normal, you will get a letter in mail and a message via . If you don't hear from [place] in two weeks, please give [place] a call. Otherwise, we look forward to seeing you again at your next visit. If you have any questions or concerns before then, please call the clinic at [phone].  Please bring all your medications to every doctors visit  Sign up for My Chart to have easy access to your labs results, and communication with your Primary care physician.    Please check-out at the front desk before leaving the clinic.    Best,  Dr. Sherene Sires FAMILY MEDICINE RESIDENT - PGY1 09/16/2017 3:28 PM

## 2017-09-17 DIAGNOSIS — Z8589 Personal history of malignant neoplasm of other organs and systems: Secondary | ICD-10-CM | POA: Insufficient documentation

## 2017-09-17 DIAGNOSIS — H6192 Disorder of left external ear, unspecified: Secondary | ICD-10-CM | POA: Insufficient documentation

## 2017-09-17 NOTE — Assessment & Plan Note (Signed)
Treated 1x already with cryotherapy.   Did not totally resolve and plan is to treat again at upcoming derm clinic appt.

## 2017-09-17 NOTE — Assessment & Plan Note (Signed)
Still with 2 small lesions below right temple.  Current plan is likely cryotherapy at upcoming derm clinic appt.

## 2017-09-17 NOTE — Assessment & Plan Note (Signed)
Left temple excised with pathology confirming clear borders.  Healing well.  No new intervention required

## 2017-09-26 ENCOUNTER — Other Ambulatory Visit: Payer: Self-pay | Admitting: Family Medicine

## 2017-09-29 NOTE — Telephone Encounter (Signed)
Pt called to check status of med refill.  Wallace Cullens, RN

## 2017-09-30 ENCOUNTER — Encounter: Payer: Self-pay | Admitting: Family Medicine

## 2017-10-13 ENCOUNTER — Ambulatory Visit (INDEPENDENT_AMBULATORY_CARE_PROVIDER_SITE_OTHER): Payer: Medicare HMO | Admitting: Family Medicine

## 2017-10-13 ENCOUNTER — Other Ambulatory Visit: Payer: Self-pay

## 2017-10-13 DIAGNOSIS — L570 Actinic keratosis: Secondary | ICD-10-CM | POA: Diagnosis not present

## 2017-10-13 NOTE — Patient Instructions (Addendum)
Good to see you today  For the areas we froze They should be red and sore with perhaps a small blister in the next few days. They should heal back to normal in 2-3 weeks If they have any discharge or spreading redness then come back to see [place]  Have Dr Ardelia Mems look at the places on your face the next time you come in for a visit for your blood pressure   Consider   Fluorouracil, 5-FU skin cream or solution What is this medicine? FLUOROURACIL, 5-FU (flure oh YOOR a sil) is a chemotherapy agent. It is used on the skin to treat skin cancer and certain types of skin conditions that could become cancer. This medicine may be used for other purposes; ask your health care provider or pharmacist if you have questions. COMMON BRAND NAME(S): Carac, Efudex, Fluoroplex, Tolak What should I tell my health care provider before I take this medicine? They need to know if you have any of these conditions: -dihydropyrimidine dehydrogenase (DPD) deficiency -an unusual or allergic reaction to fluorouracil, other chemotherapy, other medicines, foods, dyes, or preservatives -pregnant or trying to get pregnant -breast-feeding How should I use this medicine? This medicine is only for use on the skin. Follow the directions on the prescription label. Wash hands before and after use. Wash affected area and gently pat dry. To apply this medicine use a cotton-tipped applicator, or use gloves if applying with fingertips. If applied with unprotected fingertips, it is very important to wash your hands well after you apply this medicine. Avoid applying to the eyes, nose, or mouth. Apply enough medicine to cover the affected area. You can cover the area with a light gauze dressing, but do not use tight or air-tight dressings. Finish the full course prescribed by your doctor or health care professional, even if you think your condition is better. Do not stop taking except on the advice of your doctor or health care  professional. Talk to your pediatrician regarding the use of this medicine in children. Special care may be needed. Overdosage: If you think you have taken too much of this medicine contact a poison control center or emergency room at once. NOTE: This medicine is only for you. Do not share this medicine with others. What if I miss a dose? If you miss a dose, apply it as soon as you can. If it is almost time for your next dose, only use that dose. Do not apply extra doses. Contact your doctor or health care professional if you miss more than one dose. What may interact with this medicine? Interactions are not expected. Do not use any other skin products without telling your doctor or health care professional. This list may not describe all possible interactions. Give your health care provider a list of all the medicines, herbs, non-prescription drugs, or dietary supplements you use. Also tell them if you smoke, drink alcohol, or use illegal drugs. Some items may interact with your medicine. What should I watch for while using this medicine? Visit your doctor or health care professional for checks on your progress. You will need to use this medicine for 2 to 6 weeks. This may be longer depending on the condition being treated. You may not see full healing for another 1 to 2 months after you stop using the medicine. Treated areas of skin can look unsightly during and for several weeks after treatment with this medicine. Do not get this medicine in your eyes. If you do, rinse out  with plenty of cool tap water. This medicine can make you more sensitive to the sun. Keep out of the sun. If you cannot avoid being in the sun, wear protective clothing and use sunscreen. Do not use sun lamps or tanning beds/booths. If a pet comes in contact with the area where this medicine was applied to your skin or if it is ingested, they may have a serious risk of side effects. If accidental contact happens, the skin of the  pet should be washed right away with soap and water. Contact your vet right away if your pet becomes exposed. Do not become pregnant while taking this medicine or for 1 month after stopping it. Women should inform their doctor if they wish to become pregnant or think they might be pregnant. There is a potential for serious side effects to an unborn child. Talk to your health care professional or pharmacist for more information. What side effects may I notice from receiving this medicine? Side effects that you should report to your doctor or health care professional as soon as possible: -allergic reactions like skin rash, itching or hives, swelling of the face, lips, or tongue -bloody diarrhea -fever or chills -stomach pain -vomiting Side effects that usually do not require medical attention (report to your doctor or health care professional if they continue or are bothersome): -redness or dry skin -sensitivity to light This list may not describe all possible side effects. Call your doctor for medical advice about side effects. You may report side effects to FDA at 1-800-FDA-1088. Where should I keep my medicine? Keep out of the reach of children and pets. See product for storage instructions. Each product may have different instructions. Throw away any unused medicine after the expiration date. NOTE: This sheet is a summary. It may not cover all possible information. If you have questions about this medicine, talk to your doctor, pharmacist, or health care provider.  2018 Elsevier/Gold Standard (2015-08-29 19:12:02)

## 2017-10-13 NOTE — Addendum Note (Signed)
Addended by: Talbert Cage L on: 10/13/2017 05:10 PM   Modules accepted: SmartSet

## 2017-10-13 NOTE — Progress Notes (Signed)
Subjective  Patient is presenting with the following illnesses  SKIN LESIONS Has scattered areas of erythema and scaling on face and neck and left ear lobe.  Has a SCC removed from R temple recently.  This is healing well.  Has fair skin with diffuse facial erythema   No other history of skin cancer  Chief Complaint noted Review of Symptoms - see HPI PMH - Smoking status noted.     Objective Vital Signs reviewed Typical AKs on R cheek x 2 on lower forehead x 2 on R cheek x 2 and on midline upper neck. A more diffuse scaly lesion on left earlobe that had been frozen previously but returned   No lesions on back or upper chest or forearms   After consent signed all lesions were frozen thawed frozen with a 1-2 mm halo Patient tolerated well  Assessments/Plans  AK (actinic keratosis) New multiple.  Cryotherapy today.  We discussed and gave her a handout on 5FU to consider for next winter given she will likely have more AKs in the future.   See after visit summary for details of patient instuctions

## 2017-10-13 NOTE — Assessment & Plan Note (Addendum)
New multiple.  Cryotherapy today.  We discussed and gave her a handout on 5FU to consider for next winter given she will likely have more AKs in the future.

## 2017-10-28 ENCOUNTER — Ambulatory Visit (INDEPENDENT_AMBULATORY_CARE_PROVIDER_SITE_OTHER): Payer: Medicare HMO | Admitting: Family Medicine

## 2017-10-28 ENCOUNTER — Encounter: Payer: Self-pay | Admitting: Family Medicine

## 2017-10-28 DIAGNOSIS — M810 Age-related osteoporosis without current pathological fracture: Secondary | ICD-10-CM

## 2017-10-28 DIAGNOSIS — I10 Essential (primary) hypertension: Secondary | ICD-10-CM | POA: Diagnosis not present

## 2017-10-28 DIAGNOSIS — L570 Actinic keratosis: Secondary | ICD-10-CM | POA: Diagnosis not present

## 2017-10-28 DIAGNOSIS — R1310 Dysphagia, unspecified: Secondary | ICD-10-CM

## 2017-10-28 NOTE — Patient Instructions (Signed)
Everything looks good Follow up in dermatology clinic in 2-3 months to have them recheck your skin  See me in December some time, sooner if you need  Be well, Dr. Ardelia Mems

## 2017-10-28 NOTE — Progress Notes (Signed)
Date of Visit: 10/28/2017   HPI:  Patient presents for routine follow-up.  Osteopenia: Continues to tolerate alendronate well.  Recent DEXA scan with improvement in bone density.  Swallowing: After her last visit she underwent a swallowing evaluation with speech therapy.  She was encouraged to swallow slowly and deliberately, and she reports that she has been doing very well with paying closer attention to her swallows.  Hypertension: Currently taking hydrochlorothiazide 12.5 mg daily.  Tolerating this well.  Denies chest pain or shortness of breath.  Actinic keratoses: Recently underwent [facility].  Had several areas on her face frozen off.  Has been doing well after that.  ROS: See HPI.  [place]: history of hypertension, osteoporosis, subclinical hyperthyroidism, dysphagia, glaucoma, hyperlipidemia, venous insufficiency  PHYSICAL EXAM: BP 140/80 (BP Location: Left Arm, Patient Position: Sitting, Cuff Size: Normal)   Pulse 67   Temp (!) 97.5 F (36.4 C) (Oral)   Ht 5\' 3"  (1.6 m)   Wt 148 lb 6.4 oz (67.3 kg)   SpO2 95%   BMI 26.29 kg/m  Gen: No acute distress, pleasant, cooperative HEENT: Normocephalic, atraumatic Heart: Regular rate and rhythm, no murmur Lungs: Clear bilaterally, normal effort Neuro: Alert, grossly nonfocal, speech normal Ext: Mild lower extremity edema bilaterally, wearing compression hose  ASSESSMENT/PLAN:  Health maintenance:  -Current on health maintenance items  HYPERTENSION, BENIGN SYSTEMIC At goal.  Continue current regimen.  AK (actinic keratosis) Areas well healing.  Encouraged follow-up with either a dermatologist, or our dermatology clinic on a regular basis given known skin cancers on biopsy (margins clear).  Osteoporosis Doing well on alendronate.  We will continue this for at least another year, before considering a bisphosphonate holiday.  Dysphagia Improved with behavioral  modifications around swallowing.  FOLLOW UP: Follow up in 6 months for routine medical problems  [place] J. Ardelia Mems, [place]

## 2017-11-03 NOTE — Assessment & Plan Note (Signed)
Areas well healing.  Encouraged follow-up with either a dermatologist, or our dermatology clinic on a regular basis given known skin cancers on biopsy (margins clear).

## 2017-11-03 NOTE — Assessment & Plan Note (Signed)
Doing well on alendronate.  We will continue this for at least another year, before considering a bisphosphonate holiday.

## 2017-11-03 NOTE — Assessment & Plan Note (Signed)
Improved with behavioral modifications around swallowing.

## 2017-11-03 NOTE — Assessment & Plan Note (Signed)
At goal. Continue current regimen. 

## 2018-01-12 DIAGNOSIS — H40053 Ocular hypertension, bilateral: Secondary | ICD-10-CM | POA: Diagnosis not present

## 2018-01-12 DIAGNOSIS — H2513 Age-related nuclear cataract, bilateral: Secondary | ICD-10-CM | POA: Diagnosis not present

## 2018-01-12 DIAGNOSIS — H02834 Dermatochalasis of left upper eyelid: Secondary | ICD-10-CM | POA: Diagnosis not present

## 2018-01-12 DIAGNOSIS — H02831 Dermatochalasis of right upper eyelid: Secondary | ICD-10-CM | POA: Diagnosis not present

## 2018-02-08 ENCOUNTER — Other Ambulatory Visit: Payer: Self-pay | Admitting: Family Medicine

## 2018-03-20 ENCOUNTER — Ambulatory Visit (INDEPENDENT_AMBULATORY_CARE_PROVIDER_SITE_OTHER): Payer: Medicare HMO | Admitting: Family Medicine

## 2018-03-20 ENCOUNTER — Other Ambulatory Visit: Payer: Self-pay

## 2018-03-20 VITALS — BP 150/80 | HR 74 | Temp 97.6°F | Wt 145.0 lb

## 2018-03-20 DIAGNOSIS — T148XXA Other injury of unspecified body region, initial encounter: Secondary | ICD-10-CM

## 2018-03-20 MED ORDER — BACLOFEN 10 MG PO TABS: 10.0000 mg | ORAL_TABLET | Freq: Three times a day (TID) | ORAL | 0 refills | Status: DC

## 2018-03-20 MED FILL — BACLOFEN 10 MG TABLET: 10 | 10 days supply | Qty: 30 | Fill #0

## 2018-03-20 NOTE — Progress Notes (Signed)
     Subjective: Chief Complaint  Patient presents with  . Flank Pain    HPI: Amanda Shaw is a 84 y.o. presenting to clinic today to discuss the following:  Left Sided Lower Back and Leg Pain Patient states she was engaging in intercourse with her husband and he lift her leg "back and up" and she felt a pain. It has been ongoing for about one week now. She describes the pain as sharp and located at the left side of her lower back that radiates down her left buttocks and thigh. She may or may not have heard a pop. She has suffered no fall and she is able to walk and do ADLs without any changes or limitations. The pain is constant but varies in intensity. She has taken some OTC Tylenol with little relief. She denies any numbness or tingling.  She denies fever, chills, nausea, vomiting, diarrha, constipation, urinary frequency, burning, or pain.  Health Maintenance: none     ROS noted in HPI.   Past Medical, Surgical, Social, and Family History Reviewed & Updated per EMR.   Pertinent Historical Findings include:   Social History   Tobacco Use  Smoking Status Never Smoker  Smokeless Tobacco Never Used    Objective: BP (!) 150/80   Pulse 74   Temp 97.6 F (36.4 C) (Oral)   Wt 145 lb (65.8 kg)   SpO2 97%   BMI 25.69 kg/m  Vitals and nursing notes reviewed  Physical Exam Gen: NAD CV: RRR, no murmurs, normal S1, S2 split Resp: CTAB, no wheezing, rales, or rhonchi, comfortable work of breathing MSK: Moves all four extremities, TTP in left lower spinal muscles, no greater trochanteric pain on palpation. Bilateral lower extremity strength 5/5, gait is normal  GU: No CVA tenderness Ext: no clubbing, cyanosis, or edema Neuro: No gross deficits Skin: warm, dry, intact, no rashes  No results found for this or any previous visit (from the past 72 hour(s)).  Assessment/Plan:  Muscle strain Given history and physical exam likely an acute muscle strain. I did consider  kidney stone pyelonephritis but no fever, chills, nausea, vomiting, no CVA tenderness on exam, and no urinary symptoms making either very unlikely. Advised patient to take Tyelnol with Ibuprofen and OTC muscle cream to help. Prescribed baclofen to help relax her tight muscles in the back. Patient will return if no improvement in 7-10 days.   PATIENT EDUCATION PROVIDED: See AVS    Diagnosis and plan along with any newly prescribed medication(s) were discussed in detail with this patient today. The patient verbalized understanding and agreed with the plan. Patient advised if symptoms worsen return to clinic or ER.   Health Maintainance:   No orders of the defined types were placed in this encounter.   Meds ordered this encounter  Medications  . baclofen (LIORESAL) 10 MG tablet    Sig: Take 1 tablet (10 mg total) by mouth 3 (three) times daily.    Dispense:  30 each    Refill:  0     Harolyn Rutherford, DO 03/20/2018, 3:32 PM PGY-2 [place]

## 2018-03-20 NOTE — Patient Instructions (Addendum)
It was great to meet you today! Thank you for letting me participate in your care!  Today, we discussed your right sided back pain. It is most likely due to an acute muscle strain. I advised you REST with no bending over or lifting heavy objects for the next 2 weeks. Please continue using Acetaminophen, OTC creams (like Blue-Emu), heating pads, and the Baclofen I prescribed.  You can take Baclofen up to 3 times per day as needed. It may make you sleepy. If you do not improve in 2 weeks please return to the clinic for further evaluation.   Be well, Harolyn Rutherford, DO PGY-2, [facility]

## 2018-03-26 NOTE — Assessment & Plan Note (Signed)
Given history and physical exam likely an acute muscle strain. I did consider kidney stone pyelonephritis but no fever, chills, nausea, vomiting, no CVA tenderness on exam, and no urinary symptoms making either very unlikely. Advised patient to take Tyelnol with Ibuprofen and OTC muscle cream to help. Prescribed baclofen to help relax her tight muscles in the back. Patient will return if no improvement in 7-10 days.

## 2018-04-11 ENCOUNTER — Other Ambulatory Visit: Payer: Self-pay | Admitting: Family Medicine

## 2018-06-16 ENCOUNTER — Other Ambulatory Visit: Payer: Self-pay | Admitting: Family Medicine

## 2018-06-19 ENCOUNTER — Other Ambulatory Visit: Payer: Self-pay | Admitting: Family Medicine

## 2018-06-21 ENCOUNTER — Other Ambulatory Visit: Payer: Self-pay | Admitting: Family Medicine

## 2018-08-08 ENCOUNTER — Other Ambulatory Visit: Payer: Self-pay

## 2018-08-08 ENCOUNTER — Ambulatory Visit (INDEPENDENT_AMBULATORY_CARE_PROVIDER_SITE_OTHER): Payer: Medicare HMO | Admitting: Family Medicine

## 2018-08-08 ENCOUNTER — Encounter: Payer: Self-pay | Admitting: Family Medicine

## 2018-08-08 VITALS — BP 138/80 | HR 66 | Temp 98.2°F | Ht 63.0 in | Wt 145.0 lb

## 2018-08-08 DIAGNOSIS — I10 Essential (primary) hypertension: Secondary | ICD-10-CM | POA: Diagnosis not present

## 2018-08-08 DIAGNOSIS — I872 Venous insufficiency (chronic) (peripheral): Secondary | ICD-10-CM

## 2018-08-08 DIAGNOSIS — Z0000 Encounter for general adult medical examination without abnormal findings: Secondary | ICD-10-CM | POA: Diagnosis not present

## 2018-08-08 DIAGNOSIS — E0590 Thyrotoxicosis, unspecified without thyrotoxic crisis or storm: Secondary | ICD-10-CM

## 2018-08-08 DIAGNOSIS — M810 Age-related osteoporosis without current pathological fracture: Secondary | ICD-10-CM

## 2018-08-08 DIAGNOSIS — L570 Actinic keratosis: Secondary | ICD-10-CM

## 2018-08-08 DIAGNOSIS — E785 Hyperlipidemia, unspecified: Secondary | ICD-10-CM | POA: Diagnosis not present

## 2018-08-08 MED ORDER — TETANUS-DIPHTH-ACELL PERTUSSIS 5-2.5-18.5 LF-MCG/0.5 IM SUSP: 0.5000 mL | Freq: Once | INTRAMUSCULAR | 0 refills | Status: AC

## 2018-08-08 MED ORDER — ALENDRONATE SODIUM 70 MG PO TABS: ORAL_TABLET | ORAL | 3 refills | Status: DC

## 2018-08-08 MED ORDER — HYDROCHLOROTHIAZIDE 12.5 MG PO TABS: 12.5000 mg | ORAL_TABLET | Freq: Every day | ORAL | 3 refills | Status: DC

## 2018-08-08 NOTE — Patient Instructions (Addendum)
Checking labs today  Go get tetanus shot at your pharmacy  Stay on alendronate Refilled this and HCTZ  Follow up in 6 months for your blood pressure  Be well, Dr. Ardelia Mems    Health Maintenance for Postmenopausal Women Menopause is a normal process in which your reproductive ability comes to an end. This process happens gradually over a span of months to years, usually between the ages of 13 and 38. Menopause is complete when you have missed 12 consecutive menstrual periods. It is important to talk with your health care provider about some of the most common conditions that affect postmenopausal women, such as heart disease, cancer, and bone loss (osteoporosis). Adopting a healthy lifestyle and getting preventive care can help to promote your health and wellness. Those actions can also lower your chances of developing some of these common conditions. What should I know about menopause? During menopause, you may experience a number of symptoms, such as:  Moderate-to-severe hot flashes.  Night sweats.  Decrease in sex drive.  Mood swings.  Headaches.  Tiredness.  Irritability.  Memory problems.  Insomnia. Choosing to treat or not to treat menopausal changes is an individual decision that you make with your health care provider. What should I know about hormone replacement therapy and supplements? Hormone therapy products are effective for treating symptoms that are associated with menopause, such as hot flashes and night sweats. Hormone replacement carries certain risks, especially as you become older. If you are thinking about using estrogen or estrogen with progestin treatments, discuss the benefits and risks with your health care provider. What should I know about heart disease and stroke? Heart disease, heart attack, and stroke become more likely as you age. This may be due, in part, to the hormonal changes that your body experiences during menopause. These can affect how  your body processes dietary fats, triglycerides, and cholesterol. Heart attack and stroke are both medical emergencies. There are many things that you can do to help prevent heart disease and stroke:  Have your blood pressure checked at least every 1-2 years. High blood pressure causes heart disease and increases the risk of stroke.  If you are 4-9 years old, ask your health care provider if you should take aspirin to prevent a heart attack or a stroke.  Do not use any tobacco products, including cigarettes, chewing tobacco, or electronic cigarettes. If you need help quitting, ask your health care provider.  It is important to eat a healthy diet and maintain a healthy weight. ? Be sure to include plenty of vegetables, fruits, low-fat dairy products, and lean protein. ? Avoid eating foods that are high in solid fats, added sugars, or salt (sodium).  Get regular exercise. This is one of the most important things that you can do for your health. ? Try to exercise for at least 150 minutes each week. The type of exercise that you do should increase your heart rate and make you sweat. This is known as moderate-intensity exercise. ? Try to do strengthening exercises at least twice each week. Do these in addition to the moderate-intensity exercise.  Know your numbers.Ask your health care provider to check your cholesterol and your blood glucose. Continue to have your blood tested as directed by your health care provider.  What should I know about cancer screening? There are several types of cancer. Take the following steps to reduce your risk and to catch any cancer development as early as possible. Breast Cancer  Practice breast self-awareness. ?  This means understanding how your breasts normally appear and feel. ? It also means doing regular breast self-exams. Let your health care provider know about any changes, no matter how small.  If you are 108 or older, have a clinician do a breast exam  (clinical breast exam or CBE) every year. Depending on your age, family history, and medical history, it may be recommended that you also have a yearly breast X-ray (mammogram).  If you have a family history of breast cancer, talk with your health care provider about genetic screening.  If you are at high risk for breast cancer, talk with your health care provider about having an MRI and a mammogram every year.  Breast cancer (BRCA) gene test is recommended for women who have family members with BRCA-related cancers. Results of the assessment will determine the need for genetic counseling and BRCA1 and for BRCA2 testing. BRCA-related cancers include these types: ? Breast. This occurs in males or females. ? Ovarian. ? Tubal. This may also be called fallopian tube cancer. ? Cancer of the abdominal or pelvic lining (peritoneal cancer). ? Prostate. ? Pancreatic. Cervical, Uterine, and Ovarian Cancer Your health care provider may recommend that you be screened regularly for cancer of the pelvic organs. These include your ovaries, uterus, and vagina. This screening involves a pelvic exam, which includes checking for microscopic changes to the surface of your cervix (Pap test).  For women ages 21-65, health care providers may recommend a pelvic exam and a Pap test every three years. For women ages 29-65, they may recommend the Pap test and pelvic exam, combined with testing for human papilloma virus (HPV), every five years. Some types of HPV increase your risk of cervical cancer. Testing for HPV may also be done on women of any age who have unclear Pap test results.  Other health care providers may not recommend any screening for nonpregnant women who are considered low risk for pelvic cancer and have no symptoms. Ask your health care provider if a screening pelvic exam is right for you.  If you have had past treatment for cervical cancer or a condition that could lead to cancer, you need Pap tests and  screening for cancer for at least 20 years after your treatment. If Pap tests have been discontinued for you, your risk factors (such as having a new sexual partner) need to be reassessed to determine if you should start having screenings again. Some women have medical problems that increase the chance of getting cervical cancer. In these cases, your health care provider may recommend that you have screening and Pap tests more often.  If you have a family history of uterine cancer or ovarian cancer, talk with your health care provider about genetic screening.  If you have vaginal bleeding after reaching menopause, tell your health care provider.  There are currently no reliable tests available to screen for ovarian cancer. Lung Cancer Lung cancer screening is recommended for adults 91-40 years old who are at high risk for lung cancer because of a history of smoking. A yearly low-dose CT scan of the lungs is recommended if you:  Currently smoke.  Have a history of at least 30 pack-years of smoking and you currently smoke or have quit within the past 15 years. A pack-year is smoking an average of one pack of cigarettes per day for one year. Yearly screening should:  Continue until it has been 15 years since you quit.  Stop if you develop a health problem  that would prevent you from having lung cancer treatment. Colorectal Cancer  This type of cancer can be detected and can often be prevented.  Routine colorectal cancer screening usually begins at age 56 and continues through age 72.  If you have risk factors for colon cancer, your health care provider may recommend that you be screened at an earlier age.  If you have a family history of colorectal cancer, talk with your health care provider about genetic screening.  Your health care provider may also recommend using home test kits to check for hidden blood in your stool.  A small camera at the end of a tube can be used to examine your  colon directly (sigmoidoscopy or colonoscopy). This is done to check for the earliest forms of colorectal cancer.  Direct examination of the colon should be repeated every 5-10 years until age 26. However, if early forms of precancerous polyps or small growths are found or if you have a family history or genetic risk for colorectal cancer, you may need to be screened more often. Skin Cancer  Check your skin from head to toe regularly.  Monitor any moles. Be sure to tell your health care provider: ? About any new moles or changes in moles, especially if there is a change in a mole's shape or color. ? If you have a mole that is larger than the size of a pencil eraser.  If any of your family members has a history of skin cancer, especially at a young age, talk with your health care provider about genetic screening.  Always use sunscreen. Apply sunscreen liberally and repeatedly throughout the day.  Whenever you are outside, protect yourself by wearing long sleeves, pants, a wide-brimmed hat, and sunglasses. What should I know about osteoporosis? Osteoporosis is a condition in which bone destruction happens more quickly than new bone creation. After menopause, you may be at an increased risk for osteoporosis. To help prevent osteoporosis or the bone fractures that can happen because of osteoporosis, the following is recommended:  If you are 45-57 years old, get at least 1,000 mg of calcium and at least 600 mg of vitamin D per day.  If you are older than age 51 but younger than age 39, get at least 1,200 mg of calcium and at least 600 mg of vitamin D per day.  If you are older than age 38, get at least 1,200 mg of calcium and at least 800 mg of vitamin D per day. Smoking and excessive alcohol intake increase the risk of osteoporosis. Eat foods that are rich in calcium and vitamin D, and do weight-bearing exercises several times each week as directed by your health care provider. What should I  know about how menopause affects my mental health? Depression may occur at any age, but it is more common as you become older. Common symptoms of depression include:  Low or sad mood.  Changes in sleep patterns.  Changes in appetite or eating patterns.  Feeling an overall lack of motivation or enjoyment of activities that you previously enjoyed.  Frequent crying spells. Talk with your health care provider if you think that you are experiencing depression. What should I know about immunizations? It is important that you get and maintain your immunizations. These include:  Tetanus, diphtheria, and pertussis (Tdap) booster vaccine.  Influenza every year before the flu season begins.  Pneumonia vaccine.  Shingles vaccine. Your health care provider may also recommend other immunizations. This information is not intended  to replace advice given to you by your health care provider. Make sure you discuss any questions you have with your health care provider. Document Released: 09/10/2005 Document Revised: 02/06/2016 Document Reviewed: 04/22/2015 Elsevier Interactive Patient Education  2019 [employer].

## 2018-08-08 NOTE — Progress Notes (Addendum)
   Established Patient Office Visit  Subjective:  Patient ID: Ellwood Dense, female    DOB: [birth year]  Age: 85 y.o. MRN: [id number]   HPI YEVA BISSETTE presents for Annual Physical  Well woman history: Walks regularly for exercise No alcohol No drugs No smoking Feels safe in relationships No depressive symptoms  She has several concerns today: Mole behind L ear: States she has had the lesion "all her life". Previous hx of AKs of face, removed in [facility]. Reports irritation around her ear when laying down on L side due to mole.   Dizziness: Reports feeling dizzy when standing up, especially when getting out of bed. Husband reports seeing her wobble sometimes when she gets up to walk in a hurry. No syncope. No chest pain or shortness of breath. Does better if she gets up slowly.  Osteopenia: Currently taking Alendronate with minimal issue. Asking when she can stop taking the medication as she is trying to reduce her med list. Started Alendronate in 2017. Tolerating it fine, swallows it well.  Hyperlipidemia - taking simvastatin 20mg  daily. Tolerating well. Fasting today.  HTN: Currently taking HCTZ ,splitting her 25mg  tablet and taking 12.5mg  at a time. Asking if a separate 12.5mg  pill tablet exists. Reports chronic swelling of L ankle w/o pain that improves with rest. Worse when she is up on her feet.     Objective:    BP 138/80 (BP Location: Right Arm, Patient Position: Sitting, Cuff Size: Normal)   Pulse 66   Temp 98.2 F (36.8 C) (Oral)   Ht 5\' 3"  (1.6 m)   Wt 145 lb (65.8 kg)   SpO2 97%   BMI 25.69 kg/m   Gen: NAD, pleasant, cooperative HEENT: NCAT Heart: RRR, no murmurs Lungs: CTAB, NWOB Neuro: grossly nonfocal, speech normal, follows commands, alert and oriented  Extremities: mild lower extremity edema bilaterally, L ankle slightly more swollen than R. No redness, warmth, or tenderness. Skin: benign appearing nevus behind L ear. Rough  skin/possible AK on L earlobe.    Assessment & Plan:   Ms. Nolan Lasser is a 85yo presenting for annual physical.   Health Maintenance: -Prescribed TDAP vaccination to be completed at pharmacy -UTD on HM items otherwise  AK (actinic keratosis) Encouraged her to see derm clinic again for more cryotherapy for possible AK. She wants to remove nevus behind L ear, will have her see derm clinic for that as well.  Hyperlipidemia Update lipid panel & CMET today  HYPERTENSION, BENIGN SYSTEMIC Well controlled. Continue current regimen. rx HCTZ 12.5mg  tabs so she doesn't have to break them in half. Counseled on standing up slowly to prevent orthostasis. Checking renal function today with CMET.  Subclinical hyperthyroidism Update TSH today  Osteoporosis Continue alendronate for a total of 5 years (will end 2022).  Venous (peripheral) insufficiency Encouraged use of compression stockings    Follow-up: Return in about 6 months (around 02/06/2019).    Boykin Peek, Medical Student    Patient seen along with MS3 student Berlinda Last. I personally evaluated this patient along with the student, and verified all aspects of the history, physical exam, and medical decision making as documented by the student. I agree with the student's documentation and have made all necessary edits.  Chrisandra Netters, MD [place]

## 2018-08-09 LAB — CMP14+EGFR
A/G RATIO: 1.6 (ref 1.2–2.2)
ALK PHOS: 62 IU/L (ref 39–117)
ALT: 12 IU/L (ref 0–32)
AST: 23 IU/L (ref 0–40)
Albumin: 4.3 g/dL (ref 3.5–4.7)
BILIRUBIN TOTAL: 0.4 mg/dL (ref 0.0–1.2)
BUN/Creatinine Ratio: 21 (ref 12–28)
BUN: 20 mg/dL (ref 8–27)
CHLORIDE: 98 mmol/L (ref 96–106)
CO2: 27 mmol/L (ref 20–29)
Calcium: 10.4 mg/dL — ABNORMAL HIGH (ref 8.7–10.3)
Creatinine, Ser: 0.94 mg/dL (ref 0.57–1.00)
GFR calc non Af Amer: 55 mL/min/{1.73_m2} — ABNORMAL LOW (ref >59)
GFR, EST AFRICAN AMERICAN: 64 mL/min/{1.73_m2} (ref >59)
GLUCOSE: 96 mg/dL (ref 65–99)
Globulin, Total: 2.7 g/dL (ref 1.5–4.5)
POTASSIUM: 4.3 mmol/L (ref 3.5–5.2)
Sodium: 141 mmol/L (ref 134–144)
Total Protein: 7 g/dL (ref 6.0–8.5)

## 2018-08-09 LAB — LIPID PANEL
CHOLESTEROL TOTAL: 152 mg/dL (ref 100–199)
Chol/HDL Ratio: 2.6 ratio (ref 0.0–4.4)
HDL: 59 mg/dL (ref >39)
LDL Calculated: 64 mg/dL (ref 0–99)
Triglycerides: 147 mg/dL (ref 0–149)
VLDL Cholesterol Cal: 29 mg/dL (ref 5–40)

## 2018-08-09 LAB — TSH: TSH: 0.336 u[IU]/mL — AB (ref 0.450–4.500)

## 2018-08-11 NOTE — Assessment & Plan Note (Signed)
Continue alendronate for a total of 5 years (will end 2022).

## 2018-08-11 NOTE — Assessment & Plan Note (Addendum)
Encouraged her to see derm clinic again for more cryotherapy for possible AK. She wants to remove nevus behind L ear, will have her see derm clinic for that as well.

## 2018-08-11 NOTE — Assessment & Plan Note (Signed)
Update lipid panel & CMET today

## 2018-08-11 NOTE — Assessment & Plan Note (Signed)
Update TSH today. 

## 2018-08-11 NOTE — Assessment & Plan Note (Signed)
Encouraged use of compression stockings. ?

## 2018-08-11 NOTE — Assessment & Plan Note (Signed)
Well controlled. Continue current regimen. rx HCTZ 12.5mg  tabs so she doesn't have to break them in half. Counseled on standing up slowly to prevent orthostasis. Checking renal function today with CMET.

## 2018-08-21 ENCOUNTER — Telehealth: Payer: Self-pay | Admitting: Family Medicine

## 2018-08-21 DIAGNOSIS — R7989 Other specified abnormal findings of blood chemistry: Secondary | ICD-10-CM

## 2018-08-21 NOTE — Telephone Encounter (Signed)
Attempted to reach patient to discuss labs.  Calcium mildly elevated, likely due to HCTZ. Will just recheck this. TSH mildly low, will recheck and get T3/T4.   No answer, LVM asking patient to call back.  When she returns the call please tell her that I want to repeat parts of her bloodwork. Nothing looked bad but just want to recheck calcium level and some more thyroid labs. She should schedule a lab visit.  I am happy to speak with her if she has questions.  Leeanne Rio, MD

## 2018-08-24 ENCOUNTER — Other Ambulatory Visit: Payer: Self-pay

## 2018-08-24 ENCOUNTER — Ambulatory Visit (INDEPENDENT_AMBULATORY_CARE_PROVIDER_SITE_OTHER): Payer: Medicare HMO | Admitting: Family Medicine

## 2018-08-24 VITALS — BP 126/88 | HR 86 | Temp 97.9°F | Ht 63.0 in | Wt 147.6 lb

## 2018-08-24 DIAGNOSIS — L570 Actinic keratosis: Secondary | ICD-10-CM | POA: Diagnosis not present

## 2018-08-24 DIAGNOSIS — D224 Melanocytic nevi of scalp and neck: Secondary | ICD-10-CM | POA: Diagnosis not present

## 2018-08-24 DIAGNOSIS — L989 Disorder of the skin and subcutaneous tissue, unspecified: Secondary | ICD-10-CM

## 2018-08-24 NOTE — Patient Instructions (Signed)
Wound Care, Adult  Taking care of your wound properly can help to prevent pain, infection, and scarring. It can also help your wound to heal more quickly.  How to care for your wound  Wound care          Follow instructions from your health care provider about how to take care of your wound. Make sure you:  ? Wash your hands with soap and water before you change the bandage (dressing). If soap and water are not available, use hand sanitizer.  ? Change your dressing as told by your health care provider.  ? Leave stitches (sutures), skin glue, or adhesive strips in place. These skin closures may need to stay in place for 2 weeks or longer. If adhesive strip edges start to loosen and curl up, you may trim the loose edges. Do not remove adhesive strips completely unless your health care provider tells you to do that.   Check your wound area every day for signs of infection. Check for:  ? Redness, swelling, or pain.  ? Fluid or blood.  ? Warmth.  ? Pus or a bad smell.   Ask your health care provider if you should clean the wound with mild soap and water. Doing this may include:  ? Using a clean towel to pat the wound dry after cleaning it. Do not rub or scrub the wound.  ? Applying a cream or ointment. Do this only as told by your health care provider.  ? Covering the incision with a clean dressing.   Ask your health care provider when you can leave the wound uncovered.   Keep the dressing dry until your health care provider says it can be removed. Do not take baths, swim, use a hot tub, or do anything that would put the wound underwater until your health care provider approves. Ask your health care provider if you can take showers. You may only be allowed to take sponge baths.  Medicines     If you were prescribed an antibiotic medicine, cream, or ointment, take or use the antibiotic as told by your health care provider. Do not stop taking or using the antibiotic even if your condition improves.   Take  over-the-counter and prescription medicines only as told by your health care provider. If you were prescribed pain medicine, take it 30 or more minutes before you do any wound care or as told by your health care provider.  General instructions   Return to your normal activities as told by your health care provider. Ask your health care provider what activities are safe.   Do not scratch or pick at the wound.   Do not use any products that contain nicotine or tobacco, such as cigarettes and e-cigarettes. These may delay wound healing. If you need help quitting, ask your health care provider.   Keep all follow-up visits as told by your health care provider. This is important.   Eat a diet that includes protein, vitamin A, vitamin C, and other nutrient-rich foods to help the wound heal.  ? Foods rich in protein include meat, dairy, beans, nuts, and other sources.  ? Foods rich in vitamin A include carrots and dark green, leafy vegetables.  ? Foods rich in vitamin C include citrus, tomatoes, and other fruits and vegetables.  ? Nutrient-rich foods have protein, carbohydrates, fat, vitamins, or minerals. Eat a variety of healthy foods including vegetables, fruits, and whole grains.  Contact a health care provider if:     You received a tetanus shot and you have swelling, severe pain, redness, or bleeding at the injection site.   Your pain is not controlled with medicine.   You have redness, swelling, or pain around the wound.   You have fluid or blood coming from the wound.   Your wound feels warm to the touch.   You have pus or a bad smell coming from the wound.   You have a fever or chills.   You are nauseous or you vomit.   You are dizzy.  Get help right away if:   You have a red streak going away from your wound.   The edges of the wound open up and separate.   Your wound is bleeding, and the bleeding does not stop with gentle pressure.   You have a rash.   You faint.   You have trouble  breathing.  Summary   Always wash your hands with soap and water before changing your bandage (dressing).   To help with healing, eat foods that are rich in protein, vitamin A, vitamin C, and other nutrients.   Check your wound every day for signs of infection. Contact your health care provider if you suspect that your wound is infected.  This information is not intended to replace advice given to you by your health care provider. Make sure you discuss any questions you have with your health care provider.  Document Released: 04/27/2008 Document Revised: 08/30/2017 Document Reviewed: 02/03/2016  Elsevier Interactive Patient Education  2019 [employer]

## 2018-08-24 NOTE — Progress Notes (Signed)
Subjective:     Patient ID: Amanda Shaw, female   DOB: [birth year], 85 y.o.   MRN: [id number]  HPI Mole: Mole behind her left ear started many years ago. Denies any symptoms. Gets irritated sometimes. She has similar mole on her left arm. No previous treatment. Facial lesion: Two small place on the right side of her face and right side of her nasal ridge. She will like to get them frozen.  Current Outpatient Medications on File Prior to Visit  Medication Sig Dispense Refill  . alendronate (FOSAMAX) 70 MG tablet TAKE 1 TABLET EVERY 7 DAYS WITH A FULL GLASS OF WATER ON AN EMPTY STOMACH. 12 tablet 3  . aspirin 81 MG tablet Take 81 mg by mouth daily.    . baclofen (LIORESAL) 10 MG tablet Take 1 tablet (10 mg total) by mouth 3 (three) times daily. 30 each 0  . Calcium Carbonate-Vitamin D (CALTRATE 600+D PO) Take 1 tablet by mouth daily.    . hydrochlorothiazide (HYDRODIURIL) 12.5 MG tablet Take 1 tablet (12.5 mg total) by mouth daily. 90 tablet 3  . simvastatin (ZOCOR) 20 MG tablet TAKE 1 TABLET EVERY EVENING 90 tablet 0   No current facility-administered medications on file prior to visit.    Past Medical History:  Diagnosis Date  . Arthritis    arthritis in hips   . Fracture of ankle, left, closed 10/1998  . Gastritis 07/2006   by endoscopy  . HEMORRHOIDS, NOS 09/29/2006   Qualifier: Diagnosis of  By: Walker Kehr MD, Patrick Jupiter    . Hypertension   . Subungual hematoma of right foot 02/18/2015   Right great toe    Vitals:   08/24/18 1345  BP: 126/88  Pulse: 86  Temp: 97.9 F (36.6 C)  TempSrc: Oral  SpO2: 91%  Weight: 147 lb 9.6 oz (67 kg)  Height: 5\' 3"  (1.6 m)     Review of Systems  Constitutional: Negative for fever.  Skin:       Skin lesion  All other systems reviewed and are negative.      Objective:   Physical Exam Vitals signs and nursing note reviewed.  Constitutional:      Appearance: She is not ill-appearing.  Skin:    Comments: 1. Barely visible, dry,  scaly, raised lesion on her right cheek and her left nasal ridge measuring less than 0.61mm each.  2. A small raised, firm, fleshy lesion on her left occipital region, just few inches away from her left ear lobe.  3. Similar lesion on her left arm over the deltoid.  See images    Neurological:     Mental Status: She is alert.                Assessment:     Facial actinic keratosis Benign skin lesion of left arm likely skin tag. Skin lesion on her left occipital region    Plan:     1. For her AK, I recommended close monitoring since it is pretty small. She requested that we freeze lesion today.  2. We offered removal of her arm lesion. However, she declined since it does not bother her.  3. She request to get the lesion on the back of her ear/posterior neck or occiput area removed today.  Informed verbal and written consent were obtained for cryotherapy and shave biopsy.   Cryotherapy and Shave biopsy  Preoperative diagnosis: AK of the face. Skin lesion posterior neck  Postoperative diagnosis: same  Procedure:Cryotherapy of facial  lesion and shave biopsy of post neck lesion.  Physician: Andrena Mews, MD, MPH  Preprocedure counseling: The risks, benefits, and alternatives of the procedure were discussed with the patient.    EBL: 0 ml  Anesthesia: None for cryotherapy. 2% Lidocaine with epi 1cc local infiltrate of the posterior neck lesion.  Procedures: Consent and a timeout were performed prior to starting the procedure.  1. Lesions identified: both lesions dapped with Q-tip soaked in liquid nitrogen until an ice ball formed. This was allowed to thaw and then the cryotherapy was again applied x 2 sessions all together.  Procedure was well tolerated without immediate complications.  2. Shave biopsy. The area surrounding the skin lesion was prepared and draped in the usual sterile manner. The lesion was removed in the usual manner by the biopsy method noted  above. Hemostasis was assured with silver nitrate application.   Closure:  None  Specimen sent for pathology.   Followup: The patient tolerated the procedure well without complications.  Standard post-procedure care is explained and return precautions are given.

## 2018-08-25 NOTE — Telephone Encounter (Signed)
Called and informed patient of lab results per Dr. Ardelia Mems and told her that she needed repeat calcium and additional thyroid testing.  Patient states that she is in the car driving and will call back later to schedule an appointment when she has time to lok at her schedule.  Amanda Shaw, CMA\

## 2018-08-28 ENCOUNTER — Other Ambulatory Visit: Payer: Medicare HMO

## 2018-08-28 ENCOUNTER — Other Ambulatory Visit: Payer: Self-pay | Admitting: Family Medicine

## 2018-08-28 ENCOUNTER — Telehealth: Payer: Self-pay | Admitting: Family Medicine

## 2018-08-28 DIAGNOSIS — R7989 Other specified abnormal findings of blood chemistry: Secondary | ICD-10-CM | POA: Diagnosis not present

## 2018-08-28 NOTE — Telephone Encounter (Signed)
Dr. Augustin Coupe called me back and confirmed that this nevus is completely within the intradermal region without concern for malignant transformation.

## 2018-08-28 NOTE — Telephone Encounter (Signed)
Skin biopsy report discussed with the patient.  Per report, there is no concern for malignancy, although some chances for malignant transformation.   I called to speak with the pathologist to confirm that there is no cause for concern at this point.   Message left for [employer]. Lin MD to call me back.  I will let the patient know if anything changes.   She verbalizes understanding.

## 2018-08-29 LAB — BASIC METABOLIC PANEL
BUN / CREAT RATIO: 25 (ref 12–28)
BUN: 22 mg/dL (ref 8–27)
CHLORIDE: 105 mmol/L (ref 96–106)
CO2: 26 mmol/L (ref 20–29)
Calcium: 9.5 mg/dL (ref 8.7–10.3)
Creatinine, Ser: 0.87 mg/dL (ref 0.57–1.00)
GFR calc Af Amer: 70 mL/min/{1.73_m2} (ref >59)
GFR calc non Af Amer: 61 mL/min/{1.73_m2} (ref >59)
GLUCOSE: 94 mg/dL (ref 65–99)
Potassium: 4.6 mmol/L (ref 3.5–5.2)
Sodium: 147 mmol/L — ABNORMAL HIGH (ref 134–144)

## 2018-08-29 LAB — TSH: TSH: 0.225 u[IU]/mL — AB (ref 0.450–4.500)

## 2018-08-29 LAB — T4, FREE: Free T4: 1.01 ng/dL (ref 0.82–1.77)

## 2018-08-29 LAB — T3: T3, Total: 134 ng/dL (ref 71–180)

## 2018-09-01 ENCOUNTER — Encounter: Payer: Self-pay | Admitting: Family Medicine

## 2019-01-17 DIAGNOSIS — H1045 Other chronic allergic conjunctivitis: Secondary | ICD-10-CM | POA: Diagnosis not present

## 2019-01-17 DIAGNOSIS — H02831 Dermatochalasis of right upper eyelid: Secondary | ICD-10-CM | POA: Diagnosis not present

## 2019-01-17 DIAGNOSIS — H40053 Ocular hypertension, bilateral: Secondary | ICD-10-CM | POA: Diagnosis not present

## 2019-01-17 DIAGNOSIS — H04123 Dry eye syndrome of bilateral lacrimal glands: Secondary | ICD-10-CM | POA: Diagnosis not present

## 2019-01-17 DIAGNOSIS — H02834 Dermatochalasis of left upper eyelid: Secondary | ICD-10-CM | POA: Diagnosis not present

## 2019-01-17 DIAGNOSIS — H2513 Age-related nuclear cataract, bilateral: Secondary | ICD-10-CM | POA: Diagnosis not present

## 2019-01-19 DIAGNOSIS — M79671 Pain in right foot: Secondary | ICD-10-CM | POA: Diagnosis not present

## 2019-01-19 DIAGNOSIS — M2012 Hallux valgus (acquired), left foot: Secondary | ICD-10-CM | POA: Diagnosis not present

## 2019-01-19 DIAGNOSIS — M79672 Pain in left foot: Secondary | ICD-10-CM | POA: Diagnosis not present

## 2019-01-19 DIAGNOSIS — I739 Peripheral vascular disease, unspecified: Secondary | ICD-10-CM | POA: Diagnosis not present

## 2019-01-19 DIAGNOSIS — M2011 Hallux valgus (acquired), right foot: Secondary | ICD-10-CM | POA: Diagnosis not present

## 2019-01-19 DIAGNOSIS — L84 Corns and callosities: Secondary | ICD-10-CM | POA: Diagnosis not present

## 2019-03-19 ENCOUNTER — Ambulatory Visit (INDEPENDENT_AMBULATORY_CARE_PROVIDER_SITE_OTHER): Payer: Medicare HMO | Admitting: Family Medicine

## 2019-03-19 ENCOUNTER — Other Ambulatory Visit: Payer: Self-pay

## 2019-03-19 VITALS — BP 130/70 | HR 71

## 2019-03-19 DIAGNOSIS — M10071 Idiopathic gout, right ankle and foot: Secondary | ICD-10-CM

## 2019-03-19 DIAGNOSIS — M79671 Pain in right foot: Secondary | ICD-10-CM | POA: Diagnosis not present

## 2019-03-19 MED ORDER — NAPROXEN 500 MG PO TABS: 500.0000 mg | ORAL_TABLET | Freq: Two times a day (BID) | ORAL | 0 refills | Status: DC

## 2019-03-19 MED ORDER — BENZOCAINE 2 % EX OINT: 1.0000 "application " | TOPICAL_OINTMENT | Freq: Two times a day (BID) | CUTANEOUS | 0 refills | Status: DC | PRN

## 2019-03-19 MED FILL — NAPROXEN 500 MG TABLET: 500 | 15 days supply | Qty: 30 | Fill #0

## 2019-03-19 NOTE — Patient Instructions (Signed)
It was great to see you today! Thank you for letting me participate in your care!  Today, we discussed your right foot redness, pain, and swelling. I am glad it is getting better! I believe this is due to a gout flare. For your gout flare I have given you a medication called Naproxen. Please take it as prescribed for the next 3-5 days. After that only take it if you need it. If you are not improved in 5 days please call us back.    Gout  Gout is painful swelling of your joints. Gout is a type of arthritis. It is caused by having too much uric acid in your body. Uric acid is a chemical that is made when your body breaks down substances called purines. If your body has too much uric acid, sharp crystals can form and build up in your joints. This causes pain and swelling. Gout attacks can happen quickly and be very painful (acute gout). Over time, the attacks can affect more joints and happen more often (chronic gout). What are the causes?  Too much uric acid in your blood. This can happen because: ? Your kidneys do not remove enough uric acid from your blood. ? Your body makes too much uric acid. ? You eat too many foods that are high in purines. These foods include organ meats, some seafood, and beer.  Trauma or stress. What increases the risk?  Having a family history of gout.  Being female and middle-aged.  Being female and having gone through menopause.  Being very overweight (obese).  Drinking alcohol, especially beer.  Not having enough water in the body (being dehydrated).  Losing weight too quickly.  Having an organ transplant.  Having lead poisoning.  Taking certain medicines.  Having kidney disease.  Having a skin condition called psoriasis. What are the signs or symptoms? An attack of acute gout usually happens in just one joint. The most common place is the big toe. Attacks often start at night. Other joints that may be affected include joints of the feet, ankle,  knee, fingers, wrist, or elbow. Symptoms of an attack may include:  Very bad pain.  Warmth.  Swelling.  Stiffness.  Shiny, red, or purple skin.  Tenderness. The affected joint may be very painful to touch.  Chills and fever. Chronic gout may cause symptoms more often. More joints may be involved. You may also have white or yellow lumps (tophi) on your hands or feet or in other areas near your joints. How is this treated?  Treatment for this condition has two phases: treating an acute attack and preventing future attacks.  Acute gout treatment may include: ? NSAIDs. ? Steroids. These are taken by mouth or injected into a joint. ? Colchicine. This medicine relieves pain and swelling. It can be given by mouth or through an IV tube.  Preventive treatment may include: ? Taking small doses of NSAIDs or colchicine daily. ? Using a medicine that reduces uric acid levels in your blood. ? Making changes to your diet. You may need to see a food expert (dietitian) about what to eat and drink to prevent gout. Follow these instructions at home: During a gout attack   If told, put ice on the painful area: ? Put ice in a plastic bag. ? Place a towel between your skin and the bag. ? Leave the ice on for 20 minutes, 2-3 times a day.  Raise (elevate) the painful joint above the level of your heart  as often as you can.  Rest the joint as much as possible. If the joint is in your leg, you may be given crutches.  Follow instructions from your doctor about what you cannot eat or drink. Avoiding future gout attacks  Eat a low-purine diet. Avoid foods and drinks such as: ? Liver. ? Kidney. ? Anchovies. ? Asparagus. ? Herring. ? Mushrooms. ? Mussels. ? Beer.  Stay at a healthy weight. If you want to lose weight, talk with your doctor. Do not lose weight too fast.  Start or continue an exercise plan as told by your doctor. Eating and drinking  Drink enough fluids to keep your pee  (urine) pale yellow.  If you drink alcohol: ? Limit how much you use to:  0-1 drink a day for women.  0-2 drinks a day for men. ? Be aware of how much alcohol is in your drink. In the [place] one drink equals one 12 oz bottle of beer (355 mL), one 5 oz glass of wine (148 mL), or one 1 oz glass of hard liquor (44 mL). General instructions  Take over-the-counter and prescription medicines only as told by your doctor.  Do not drive or use heavy machinery while taking prescription pain medicine.  Return to your normal activities as told by your doctor. Ask your doctor what activities are safe for you.  Keep all follow-up visits as told by your doctor. This is important. Contact a doctor if:  You have another gout attack.  You still have symptoms of a gout attack after 10 days of treatment.  You have problems (side effects) because of your medicines.  You have chills or a fever.  You have burning pain when you pee (urinate).  You have pain in your lower back or belly. Get help right away if:  You have very bad pain.  Your pain cannot be controlled.  You cannot pee. Summary  Gout is painful swelling of the joints.  The most common site of pain is the big toe, but it can affect other joints.  Medicines and avoiding some foods can help to prevent and treat gout attacks. This information is not intended to replace advice given to you by your health care provider. Make sure you discuss any questions you have with your health care provider. Document Released: 04/27/2008 Document Revised: 02/08/2018 Document Reviewed: 02/08/2018 Elsevier Patient Education  2020 [place] [facility], Harolyn Rutherford, DO PGY-3, [facility]

## 2019-03-19 NOTE — Progress Notes (Addendum)
     Subjective: Chief Complaint  Patient presents with  . Leg Swelling    BILATERAL   HPI: Amanda Shaw is a 85 y.o. presenting to clinic today to discuss the following:  Foot pain and swelling Patient endorses bilateral ankle swelling that is long term and recent red, swollen, painful right foot. It is on the side of the foot along the medial side and in the right great toe. It is getting better and started on Friday. It started a day after she spent a long time in her garden working the day before. She took some tylenol, advil, and OTC Blue Emu that helped some.   Denies fever, chills, nausea, vomiting  ROS noted in HPI.   Past Medical, Surgical, Social, and Family History Reviewed & Updated per EMR.   Pertinent Historical Findings include:   Social History   Tobacco Use  Smoking Status Never Smoker  Smokeless Tobacco Never Used    Objective: BP 130/70   Pulse 71   SpO2 96%  Vitals and nursing notes reviewed  Physical Exam Gen: Alert and Oriented x 3, NAD MSK: right foot erythema, mild effusion, TTP at right great toe and medial side of the midfoot Ext: no clubbing, cyanosis, or edema Skin: warm, dry, intact, no rashes  LABS Uric Acid: 6.0 CBC    Component Value Date/Time   WBC 6.7 03/19/2019 1659   WBC 5.6 07/16/2016 0921   RBC 4.94 03/19/2019 1659   RBC 4.68 07/16/2016 0921   HGB 15.7 03/19/2019 1659   HCT 45.8 03/19/2019 1659   PLT 227 03/19/2019 1659   MCV 93 03/19/2019 1659   MCH 31.8 03/19/2019 1659   MCH 31.8 07/16/2016 0921   MCHC 34.3 03/19/2019 1659   MCHC 33.7 07/16/2016 0921   RDW 12.6 03/19/2019 1659   LYMPHSABS 2.6 03/19/2019 1659   MONOABS 392 07/16/2016 0921   EOSABS 0.2 03/19/2019 1659   BASOSABS 0.0 03/19/2019 1659    Assessment/Plan:  Gout attack Considered Gout vs OA vs Cellulitis vs Osteomyelitis. Gout most likely given it is acute, erythematous, swollen, and tender to palpation. OA is usually more insidious.  Cellulitis can present this way but she has no fever, no systemic symptoms, and CBC normal. Osteomyelitis would not be improving as her pain and swelling is doing better. Uric acid is at the upper end of normal. - Naproxen 500mg  BID for 7-10 days - Uric Acid - If no improvement on Naproxen consider x-ray   PATIENT EDUCATION PROVIDED: See AVS    Diagnosis and plan along with any newly prescribed medication(s) were discussed in detail with this patient today. The patient verbalized understanding and agreed with the plan. Patient advised if symptoms worsen return to clinic or ER.    Orders Placed This Encounter  Procedures  . Uric Acid  . CBC with Differential    Meds ordered this encounter  Medications  . naproxen (NAPROSYN) 500 MG tablet    Sig: Take 1 tablet (500 mg total) by mouth 2 (two) times daily with a meal.    Dispense:  30 tablet    Refill:  0  . BENZOCAINE, TOPICAL, 2 % OINT    Sig: Apply 1 application topically 2 (two) times daily as needed.    Dispense:  52.5 g    Refill:  0    Harolyn Rutherford, DO 03/19/2019, 4:21 PM PGY-3 [place]

## 2019-03-20 LAB — URIC ACID: Uric Acid: 6 mg/dL (ref 2.5–7.1)

## 2019-03-20 LAB — CBC WITH DIFFERENTIAL/PLATELET
Basophils Absolute: 0 10*3/uL (ref 0.0–0.2)
Basos: 0 %
EOS (ABSOLUTE): 0.2 10*3/uL (ref 0.0–0.4)
Eos: 3 %
Hematocrit: 45.8 % (ref 34.0–46.6)
Hemoglobin: 15.7 g/dL (ref 11.1–15.9)
Immature Grans (Abs): 0 10*3/uL (ref 0.0–0.1)
Immature Granulocytes: 0 %
Lymphocytes Absolute: 2.6 10*3/uL (ref 0.7–3.1)
Lymphs: 38 %
MCH: 31.8 pg (ref 26.6–33.0)
MCHC: 34.3 g/dL (ref 31.5–35.7)
MCV: 93 fL (ref 79–97)
Monocytes Absolute: 0.5 10*3/uL (ref 0.1–0.9)
Monocytes: 7 %
Neutrophils Absolute: 3.4 10*3/uL (ref 1.4–7.0)
Neutrophils: 52 %
Platelets: 227 10*3/uL (ref 150–450)
RBC: 4.94 x10E6/uL (ref 3.77–5.28)
RDW: 12.6 % (ref 11.7–15.4)
WBC: 6.7 10*3/uL (ref 3.4–10.8)

## 2019-03-23 DIAGNOSIS — M109 Gout, unspecified: Secondary | ICD-10-CM | POA: Insufficient documentation

## 2019-03-23 NOTE — Assessment & Plan Note (Signed)
Considered Gout vs OA vs Cellulitis vs Osteomyelitis. Gout most likely given it is acute, erythematous, swollen, and tender to palpation. OA is usually more insidious. Cellulitis can present this way but she has no fever, no systemic symptoms, and CBC normal. Osteomyelitis would not be improving as her pain and swelling is doing better. Uric acid is at the upper end of normal. - Naproxen 500mg  BID for 7-10 days - Uric Acid - If no improvement on Naproxen consider x-ray

## 2019-06-01 ENCOUNTER — Other Ambulatory Visit: Payer: Self-pay | Admitting: Family Medicine

## 2019-07-06 ENCOUNTER — Ambulatory Visit (INDEPENDENT_AMBULATORY_CARE_PROVIDER_SITE_OTHER): Payer: Medicare HMO

## 2019-07-06 ENCOUNTER — Other Ambulatory Visit: Payer: Self-pay

## 2019-07-06 VITALS — BP 138/80 | HR 68 | Temp 97.8°F | Ht 63.2 in | Wt 143.0 lb

## 2019-07-06 DIAGNOSIS — Z0000 Encounter for general adult medical examination without abnormal findings: Secondary | ICD-10-CM | POA: Diagnosis not present

## 2019-07-06 NOTE — Patient Instructions (Addendum)
You spoke to Amanda Shaw, [name] for your annual wellness visit.  We discussed goals: Goals    . Exercise 3x per week (30 min per time)     Patient walking ~ 10 minutes a time. Try and do 15 minutes in AM and PM to reach 30 minutes per day!      We also discussed recommended health maintenance. As discussed, you are up to date with everything! I updated your flu and tetanus vaccines you received at your pharmacy. We scheduled you an apt with PCP on 08/09/2019.   Health Maintenance  Topic Date Due  . INFLUENZA VACCINE  03/03/2019  . TETANUS/TDAP  08/08/2028  . DEXA SCAN  Completed  . PNA vac Low Risk Adult  Completed   Fill out the advance directive packet you have and bring back to our office.  Start walking 15 minutes in AM and 15 minutes in PM to reach goal! We scheduled you a visit with PCP on 08/09/2019.  We also discussed social distancing and wearing a mask while visiting [facility].  Preventive Care 83 Years and Older, Female Preventive care refers to lifestyle choices and visits with your health care provider that can promote health and wellness. This includes:  A yearly physical exam. This is also called an annual well check.  Regular dental and eye exams.  Immunizations.  Screening for certain conditions.  Healthy lifestyle choices, such as diet and exercise. What can I expect for my preventive care visit? Physical exam Your health care provider will check:  Height and weight. These may be used to calculate body mass index (BMI), which is a measurement that tells if you are at a healthy weight.  Heart rate and blood pressure.  Your skin for abnormal spots. Counseling Your health care provider may ask you questions about:  Alcohol, tobacco, and drug use.  Emotional well-being.  Home and relationship well-being.  Sexual activity.  Eating habits.  History of falls.  Memory and ability to understand (cognition).  Work and work Statistician.  Pregnancy  and menstrual history. What immunizations do I need?  Influenza (flu) vaccine  This is recommended every year. Tetanus, diphtheria, and pertussis (Tdap) vaccine  You may need a Td booster every 10 years. Varicella (chickenpox) vaccine  You may need this vaccine if you have not already been vaccinated. Zoster (shingles) vaccine  You may need this after age 74. Pneumococcal conjugate (PCV13) vaccine  One dose is recommended after age 57. Pneumococcal polysaccharide (PPSV23) vaccine  One dose is recommended after age 61. Measles, mumps, and rubella (MMR) vaccine  You may need at least one dose of MMR if you were born in 1957 or later. You may also need a second dose. Meningococcal conjugate (MenACWY) vaccine  You may need this if you have certain conditions. Hepatitis A vaccine  You may need this if you have certain conditions or if you travel or work in places where you may be exposed to hepatitis A. Hepatitis B vaccine  You may need this if you have certain conditions or if you travel or work in places where you may be exposed to hepatitis B. Haemophilus influenzae type b (Hib) vaccine  You may need this if you have certain conditions. You may receive vaccines as individual doses or as more than one vaccine together in one shot (combination vaccines). Talk with your health care provider about the risks and benefits of combination vaccines. What tests do I need? Blood tests  Lipid and  cholesterol levels. These may be checked every 5 years, or more frequently depending on your overall health.  Hepatitis C test.  Hepatitis B test. Screening  Lung cancer screening. You may have this screening every year starting at age 34 if you have a 30-pack-year history of smoking and currently smoke or have quit within the past 15 years.  Colorectal cancer screening. All adults should have this screening starting at age 43 and continuing until age 79. Your health care provider may  recommend screening at age 51 if you are at increased risk. You will have tests every 1-10 years, depending on your results and the type of screening test.  Diabetes screening. This is done by checking your blood sugar (glucose) after you have not eaten for a while (fasting). You may have this done every 1-3 years.  Mammogram. This may be done every 1-2 years. Talk with your health care provider about how often you should have regular mammograms.  BRCA-related cancer screening. This may be done if you have a family history of breast, ovarian, tubal, or peritoneal cancers. Other tests  Sexually transmitted disease (STD) testing.  Bone density scan. This is done to screen for osteoporosis. You may have this done starting at age 25. Follow these instructions at home: Eating and drinking  Eat a diet that includes fresh fruits and vegetables, whole grains, lean protein, and low-fat dairy products. Limit your intake of foods with high amounts of sugar, saturated fats, and salt.  Take vitamin and mineral supplements as recommended by your health care provider.  Do not drink alcohol if your health care provider tells you not to drink.  If you drink alcohol: ? Limit how much you have to 0-1 drink a day. ? Be aware of how much alcohol is in your drink. In the [place] one drink equals one 12 oz bottle of beer (355 mL), one 5 oz glass of wine (148 mL), or one 1 oz glass of hard liquor (44 mL). Lifestyle  Take daily care of your teeth and gums.  Stay active. Exercise for at least 30 minutes on 5 or more days each week.  Do not use any products that contain nicotine or tobacco, such as cigarettes, e-cigarettes, and chewing tobacco. If you need help quitting, ask your health care provider.  If you are sexually active, practice safe sex. Use a condom or other form of protection in order to prevent STIs (sexually transmitted infections).  Talk with your health care provider about taking a low-dose  aspirin or statin. What's next?  Go to your health care provider once a year for a well check visit.  Ask your health care provider how often you should have your eyes and teeth checked.  Stay up to date on all vaccines. This information is not intended to replace advice given to you by your health care provider. Make sure you discuss any questions you have with your health care provider. Document Released: 08/15/2015 Document Revised: 07/13/2018 Document Reviewed: 07/13/2018 Elsevier Patient Education  2020 [place] [facility]'s number is [phone]. Please call with questions or concerns about what we discussed today.

## 2019-07-06 NOTE — Progress Notes (Signed)
I have reviewed this visit and agree with the documentation.   

## 2019-07-06 NOTE — Progress Notes (Signed)
Subjective:   ELDA HARDMON is a 85 y.o. female who presents for Medicare Annual (Subsequent) preventive examination.  Review of Systems: Defer to PCP  Cardiac Risk Factors include: advanced age (>44men, >71 women);hypertension  Objective:   Vitals: BP 138/80   Pulse 68   Temp 97.8 F (36.6 C) (Oral)   Ht 5' 3.2" (1.605 m)   Wt 143 lb (64.9 kg)   SpO2 98%   BMI 25.17 kg/m   Body mass index is 25.17 kg/m.  Advanced Directives 07/06/2019 08/24/2018 03/20/2018 10/28/2017 10/13/2017 09/16/2017 07/28/2017  Does Patient Have a Medical Advance Directive? No No No No No No No  Does patient want to make changes to medical advance directive? - - - - - - -  Would patient like information on creating a medical advance directive? Yes (MAU/Ambulatory/Procedural Areas - Information given) No - Patient declined No - Patient declined No - Patient declined No - Patient declined No - Patient declined No - Patient declined  Pre-existing out of facility DNR order (yellow form or pink MOST form) - - - - - - -   Tobacco Social History   Tobacco Use  Smoking Status Never Smoker  Smokeless Tobacco Never Used     Clinical Intake:  Pre-visit preparation completed: Yes  Pain Score: 0-No pain  How often do you need to have someone help you when you read instructions, pamphlets, or other written materials from your doctor or pharmacy?: 2 - Rarely What is the last grade level you completed in school?: high school  Interpreter Needed?: No  Past Medical History:  Diagnosis Date  . Arthritis    arthritis in hips   . Fracture of ankle, left, closed 10/1998  . Gastritis 07/2006   by endoscopy  . HEMORRHOIDS, NOS 09/29/2006   Qualifier: Diagnosis of  By: Walker Kehr MD, Patrick Jupiter    . Hypertension   . Subungual hematoma of right foot 02/18/2015   Right great toe    Past Surgical History:  Procedure Laterality Date  . ABDOMINAL HYSTERECTOMY    . APPENDECTOMY    . BACK SURGERY    . BLADDER SUSPENSION   01/2004  . CHOLECYSTECTOMY  1987  . CRYOABLATION     bridge of nose, Dr Ronnald Ramp  . EXCISION/RELEASE BURSA HIP  09/22/2011   Procedure: EXCISION/RELEASE BURSA HIP;  Surgeon: Gearlean Alf, MD;  Location: [facility];  Service: Orthopedics;  Laterality: Right;  right hip bursectomy   . LUMBAR Trainer SURGERY  [phone]  . [place] INJECTION Bilateral 2010  . TONSILLECTOMY  1956  . TRABECULECTOMY Bilateral    laser  . VAGINAL HYSTERECTOMY  1988   benign disease   Family History  Problem Relation Age of Onset  . Heart disease Mother 45  . Anuerysm Father 20  . Heart disease Brother 55       Dementia   Social History   Socioeconomic History  . Marital status: Married    Spouse name: Mortimer Fries  . Number of children: 4  . Years of education: 2  . Highest education level: High school graduate  Occupational History  . Occupation: retired   Scientific laboratory technician  . Financial resource strain: Not hard at all  . Food insecurity    Worry: Never true    Inability: Never true  . Transportation needs    Medical: No    Non-medical: No  Tobacco Use  . Smoking status: Never Smoker  . Smokeless tobacco: Never Used  Substance and  Sexual Activity  . Alcohol use: No  . Drug use: No  . Sexual activity: Not Currently  Lifestyle  . Physical activity    Days per week: 7 days    Minutes per session: 10 min  . Stress: Not at all  Relationships  . Social connections    Talks on phone: More than three times a week    Gets together: More than three times a week    Attends religious service: More than 4 times per year    Active member of club or organization: No    Attends meetings of clubs or organizations: Never    Relationship status: Married  Other Topics Concern  . Not on file  Social History Narrative   Patient lives with husband in [place], their daughter lives down the street from them.    Patient enjoys spending time with family, going to church, and going to the beach.    Patient walks  everyday for ~ 10 minutes.             Outpatient Encounter Medications as of 07/06/2019  Medication Sig  . alendronate (FOSAMAX) 70 MG tablet TAKE 1 TABLET EVERY 7 DAYS WITH A FULL GLASS OF WATER ON AN EMPTY STOMACH.  Marland Kitchen aspirin 81 MG tablet Take 81 mg by mouth daily.  . Calcium Carbonate-Vitamin D (CALTRATE 600+D PO) Take 1 tablet by mouth daily.  . hydrochlorothiazide (HYDRODIURIL) 12.5 MG tablet TAKE 1 TABLET EVERY DAY  . simvastatin (ZOCOR) 20 MG tablet TAKE 1 TABLET EVERY EVENING  . baclofen (LIORESAL) 10 MG tablet Take 1 tablet (10 mg total) by mouth 3 (three) times daily. (Patient not taking: Reported on 07/06/2019)  . BENZOCAINE, TOPICAL, 2 % OINT Apply 1 application topically 2 (two) times daily as needed. (Patient not taking: Reported on 07/06/2019)  . naproxen (NAPROSYN) 500 MG tablet Take 1 tablet (500 mg total) by mouth 2 (two) times daily with a meal. (Patient not taking: Reported on 07/06/2019)   No facility-administered encounter medications on file as of 07/06/2019.    Activities of Daily Living In your present state of health, do you have any difficulty performing the following activities: 07/06/2019  Hearing? N  Vision? N  Difficulty concentrating or making decisions? N  Walking or climbing stairs? Y  Comment hips  Dressing or bathing? N  Doing errands, shopping? N  Preparing Food and eating ? N  Using the Toilet? N  In the past six months, have you accidently leaked urine? N  Do you have problems with loss of bowel control? N  Managing your Medications? N  Managing your Finances? N  Housekeeping or managing your Housekeeping? N  Some recent data might be hidden   Patient Care Team: Leeanne Rio, MD as PCP - General (Family Medicine) Clent Jacks, MD (Ophthalmology)    Assessment:   This is a routine wellness examination for [place].  Exercise Activities and Dietary recommendations Current Exercise Habits: Home exercise routine, Type of exercise:  walking, Time (Minutes): 10, Frequency (Times/Week): 7, Weekly Exercise (Minutes/Week): 70, Intensity: Mild  Goals    . Exercise 3x per week (30 min per time)     Patient walking ~ 10 minutes a time. Try and do 15 minutes in AM and PM to reach 30 minutes per day!      Fall Risk Fall Risk  07/06/2019 08/24/2018 08/08/2018 10/28/2017 10/13/2017  Falls in the past year? 0 0 0 No No  Risk for fall due to : - - - - -  Risk for fall due to: Comment - - - - -   Is the patient's home free of loose throw rugs in walkways, pet beds, electrical cords, etc?   yes      Grab bars in the bathroom? yes      Handrails on the stairs?   yes      Adequate lighting?   yes  Patient rating of health (0-10) scale: 10   Depression Screen PHQ 2/9 Scores 07/06/2019 08/24/2018 08/08/2018 03/20/2018  PHQ - 2 Score 0 0 0 0  PHQ- 9 Score - - - -    Cognitive Function MMSE - Mini Mental State Exam 11/22/2013 12/21/2011 12/10/2010  Orientation to time 5 5 5   Orientation to Place 5 5 5   Registration 3 3 3   Attention/ Calculation 5 5 5   Recall 3 3 3   Language- name 2 objects 2 2 2   Language- repeat 1 1 1   Language- follow 3 step command 3 3 3   Language- read & follow direction 1 1 1   Write a sentence 1 1 1   Copy design 1 1 1   Total score 30 30 30    6CIT Screen 07/06/2019  What Year? 0 points  What month? 0 points  What time? 0 points  Count back from 20 0 points  Months in reverse 2 points  Repeat phrase 2 points  Total Score 4   Immunization History  Administered Date(s) Administered  . Influenza Whole 04/26/2008, 05/13/2009, 05/05/2010  . Influenza-Unspecified 06/01/2013, 04/02/2016, 05/19/2017, 04/19/2019  . Pneumococcal Conjugate-13 06/04/2014  . Pneumococcal Polysaccharide-23 07/08/1998  . Td 04/02/1997, 06/13/2008  . Zoster 07/02/2010   Screening Tests Health Maintenance  Topic Date Due  . INFLUENZA VACCINE  03/03/2019  . TETANUS/TDAP  08/08/2028  . DEXA SCAN  Completed  . PNA vac Low Risk Adult   Completed   Cancer Screenings: Lung: Low Dose CT Chest recommended if Age 38-80 years, 30 pack-year currently smoking OR have quit w/in 15years. Patient does not qualify. Breast:  Up to date on Mammogram? Yes   Up to date of Bone Density/Dexa? Yes Colorectal: Completed   Additional Screenings: PNA Vaccine: Completed   Plan:  Fill out the advance directive packet you have and bring back to our office.  Start walking 15 minutes in AM and 15 minutes in PM to reach goal! We scheduled you a visit with PCP on 08/09/2019.  I have personally reviewed and noted the following in the patient's chart:   . Medical and social history . Use of alcohol, tobacco or illicit drugs  . Current medications and supplements . Functional ability and status . Nutritional status . Physical activity . Advanced directives . List of other physicians . Hospitalizations, surgeries, and ER visits in previous 12 months . Vitals . Screenings to include cognitive, depression, and falls . Referrals and appointments  In addition, I have reviewed and discussed with patient certain preventive protocols, quality metrics, and best practice recommendations. A written personalized care plan for preventive services as well as general preventive health recommendations were provided to patient.  Dorna Bloom, [place]  07/06/2019

## 2019-08-09 ENCOUNTER — Ambulatory Visit (INDEPENDENT_AMBULATORY_CARE_PROVIDER_SITE_OTHER): Payer: Medicare HMO | Admitting: Family Medicine

## 2019-08-09 ENCOUNTER — Other Ambulatory Visit: Payer: Self-pay

## 2019-08-09 VITALS — BP 138/70 | HR 71 | Temp 96.5°F | Wt 144.8 lb

## 2019-08-09 DIAGNOSIS — E0590 Thyrotoxicosis, unspecified without thyrotoxic crisis or storm: Secondary | ICD-10-CM

## 2019-08-09 DIAGNOSIS — E785 Hyperlipidemia, unspecified: Secondary | ICD-10-CM

## 2019-08-09 DIAGNOSIS — Z8589 Personal history of malignant neoplasm of other organs and systems: Secondary | ICD-10-CM

## 2019-08-09 NOTE — Patient Instructions (Signed)
It was great to see you again today!  Checking labs today Get in with dermatologist for your skin  Call with any questions Follow up in 1 year for next physical  Be well, Dr. Ardelia Mems

## 2019-08-09 NOTE — Progress Notes (Signed)
Date of Visit: 08/09/2019   HPI:  Patient presents today for a well woman exam.   Concerns today: None Exercise: Patient walks regularly  Diet: Continues to have a normal diet, tries to eat healthy Smoking: none Alcohol: none Drugs: None Mood: No concerns, 0 on PHQ2, feels safe in her relationships Dentist: Yes  She is fasting today  History of AK's on skin as well as squamous cell carcinoma Has noticed new dry rough spots on her face and L ear  PHYSICAL EXAM: BP 138/70   Pulse 71   Temp (!) 96.5 F (35.8 C) (Axillary)   Wt 144 lb 12.8 oz (65.7 kg)   SpO2 96%   BMI 25.49 kg/m  Gen: NAD, pleasant, cooperative HEENT: PERRL, TM normal w/good cone of light bilaterally, no palpable thyromegaly Heart: RRR, no murmurs Lungs: CTAB, NWOB Abdomen: soft, nontender to palpation, nondistended  Neuro: A/O x 3, grossly nonfocal, speech normal  ASSESSMENT/PLAN:  Health maintenance:  - Patient has received  flu vaccination, interested in COVID vaccination - gave phone # to call & schedule at health department  Hyperlipidemia Update lipids & CMET today  Subclinical hyperthyroidism Check TSH, T3, free T4 today  History of squamous cell carcinoma History of prior SCC, as well as AK's. New areas on face suspicious for AK. Given history of skin cancers recommend patient see dermatologist for full body skin exam. Patient agreeable and states she will schedule    FOLLOW UP: Follow up in 1 year for annual physical  Maudry Diego, [place]  Patient seen along with MS3 student Maudry Diego. I personally evaluated this patient along with the student, and verified all aspects of the history, physical exam, and medical decision making as documented by the student. I agree with the student's documentation and have made all necessary edits.  Chrisandra Netters, MD [place]

## 2019-08-10 LAB — LIPID PANEL
Chol/HDL Ratio: 2.2 ratio (ref 0.0–4.4)
Cholesterol, Total: 158 mg/dL (ref 100–199)
HDL: 71 mg/dL (ref >39)
LDL Chol Calc (NIH): 72 mg/dL (ref 0–99)
Triglycerides: 82 mg/dL (ref 0–149)
VLDL Cholesterol Cal: 15 mg/dL (ref 5–40)

## 2019-08-10 LAB — CMP14+EGFR
ALT: 15 IU/L (ref 0–32)
AST: 26 IU/L (ref 0–40)
Albumin/Globulin Ratio: 1.4 (ref 1.2–2.2)
Albumin: 4.3 g/dL (ref 3.6–4.6)
Alkaline Phosphatase: 61 IU/L (ref 39–117)
BUN/Creatinine Ratio: 19 (ref 12–28)
BUN: 19 mg/dL (ref 8–27)
Bilirubin Total: 0.5 mg/dL (ref 0.0–1.2)
CO2: 28 mmol/L (ref 20–29)
Calcium: 10.4 mg/dL — ABNORMAL HIGH (ref 8.7–10.3)
Chloride: 102 mmol/L (ref 96–106)
Creatinine, Ser: 0.99 mg/dL (ref 0.57–1.00)
GFR calc Af Amer: 59 mL/min/{1.73_m2} — ABNORMAL LOW (ref >59)
GFR calc non Af Amer: 51 mL/min/{1.73_m2} — ABNORMAL LOW (ref >59)
Globulin, Total: 3 g/dL (ref 1.5–4.5)
Glucose: 107 mg/dL — ABNORMAL HIGH (ref 65–99)
Potassium: 4.3 mmol/L (ref 3.5–5.2)
Sodium: 143 mmol/L (ref 134–144)
Total Protein: 7.3 g/dL (ref 6.0–8.5)

## 2019-08-10 LAB — T3: T3, Total: 150 ng/dL (ref 71–180)

## 2019-08-10 LAB — T4, FREE: Free T4: 1.17 ng/dL (ref 0.82–1.77)

## 2019-08-10 LAB — TSH: TSH: 0.145 u[IU]/mL — ABNORMAL LOW (ref 0.450–4.500)

## 2019-08-10 NOTE — Assessment & Plan Note (Signed)
Check TSH, T3, free T4 today

## 2019-08-10 NOTE — Assessment & Plan Note (Signed)
Update lipids & CMET today

## 2019-08-10 NOTE — Assessment & Plan Note (Signed)
History of prior SCC, as well as AK's. New areas on face suspicious for AK. Given history of skin cancers recommend patient see dermatologist for full body skin exam. Patient agreeable and states she will schedule

## 2019-08-31 ENCOUNTER — Encounter: Payer: Self-pay | Admitting: Family Medicine

## 2019-08-31 ENCOUNTER — Other Ambulatory Visit: Payer: Self-pay | Admitting: Family Medicine

## 2019-08-31 DIAGNOSIS — R7301 Impaired fasting glucose: Secondary | ICD-10-CM

## 2019-08-31 NOTE — Progress Notes (Signed)
Called patient about labs. Ca mildly elevated - likely due to HCTZ or calcium supplement. Will recheck.   Also check A1c given mildly elevated glucose.  Lab visit scheduled for next Friday at [place] Patient appreciative  Leeanne Rio, MD

## 2019-09-07 ENCOUNTER — Other Ambulatory Visit (INDEPENDENT_AMBULATORY_CARE_PROVIDER_SITE_OTHER): Payer: Medicare HMO

## 2019-09-07 ENCOUNTER — Other Ambulatory Visit: Payer: Self-pay

## 2019-09-07 DIAGNOSIS — R7301 Impaired fasting glucose: Secondary | ICD-10-CM

## 2019-09-07 LAB — POCT GLYCOSYLATED HEMOGLOBIN (HGB A1C): HbA1c, POC (controlled diabetic range): 5.8 % (ref 0.0–7.0)

## 2019-09-08 LAB — BASIC METABOLIC PANEL
BUN/Creatinine Ratio: 17 (ref 12–28)
BUN: 18 mg/dL (ref 8–27)
CO2: 27 mmol/L (ref 20–29)
Calcium: 10.1 mg/dL (ref 8.7–10.3)
Chloride: 100 mmol/L (ref 96–106)
Creatinine, Ser: 1.05 mg/dL — ABNORMAL HIGH (ref 0.57–1.00)
GFR calc Af Amer: 55 mL/min/{1.73_m2} — ABNORMAL LOW (ref >59)
GFR calc non Af Amer: 48 mL/min/{1.73_m2} — ABNORMAL LOW (ref >59)
Glucose: 91 mg/dL (ref 65–99)
Potassium: 4.3 mmol/L (ref 3.5–5.2)
Sodium: 141 mmol/L (ref 134–144)

## 2019-09-10 ENCOUNTER — Encounter: Payer: Self-pay | Admitting: Family Medicine

## 2019-12-14 ENCOUNTER — Other Ambulatory Visit: Payer: Self-pay | Admitting: Family Medicine

## 2020-01-22 DIAGNOSIS — H2513 Age-related nuclear cataract, bilateral: Secondary | ICD-10-CM | POA: Diagnosis not present

## 2020-01-22 DIAGNOSIS — H02831 Dermatochalasis of right upper eyelid: Secondary | ICD-10-CM | POA: Diagnosis not present

## 2020-01-22 DIAGNOSIS — H04202 Unspecified epiphora, left lacrimal gland: Secondary | ICD-10-CM | POA: Diagnosis not present

## 2020-01-22 DIAGNOSIS — H40053 Ocular hypertension, bilateral: Secondary | ICD-10-CM | POA: Diagnosis not present

## 2020-01-22 DIAGNOSIS — H1045 Other chronic allergic conjunctivitis: Secondary | ICD-10-CM | POA: Diagnosis not present

## 2020-01-22 DIAGNOSIS — H04123 Dry eye syndrome of bilateral lacrimal glands: Secondary | ICD-10-CM | POA: Diagnosis not present

## 2020-01-22 DIAGNOSIS — H02834 Dermatochalasis of left upper eyelid: Secondary | ICD-10-CM | POA: Diagnosis not present

## 2020-04-23 MED FILL — TOBRAMYCIN 0.3 % SOLN: 0.3 | 25 days supply | Qty: 5 | Fill #0

## 2020-04-29 DIAGNOSIS — I70213 Atherosclerosis of native arteries of extremities with intermittent claudication, bilateral legs: Secondary | ICD-10-CM | POA: Diagnosis not present

## 2020-04-29 DIAGNOSIS — L84 Corns and callosities: Secondary | ICD-10-CM | POA: Diagnosis not present

## 2020-04-29 DIAGNOSIS — M79671 Pain in right foot: Secondary | ICD-10-CM | POA: Diagnosis not present

## 2020-05-06 ENCOUNTER — Ambulatory Visit: Payer: Medicare HMO | Admitting: Family Medicine

## 2020-05-12 DIAGNOSIS — D2272 Melanocytic nevi of left lower limb, including hip: Secondary | ICD-10-CM | POA: Diagnosis not present

## 2020-05-12 DIAGNOSIS — Z85828 Personal history of other malignant neoplasm of skin: Secondary | ICD-10-CM | POA: Diagnosis not present

## 2020-05-12 DIAGNOSIS — L821 Other seborrheic keratosis: Secondary | ICD-10-CM | POA: Diagnosis not present

## 2020-05-12 DIAGNOSIS — L570 Actinic keratosis: Secondary | ICD-10-CM | POA: Diagnosis not present

## 2020-05-12 DIAGNOSIS — D0461 Carcinoma in situ of skin of right upper limb, including shoulder: Secondary | ICD-10-CM | POA: Diagnosis not present

## 2020-05-12 DIAGNOSIS — C44219 Basal cell carcinoma of skin of left ear and external auricular canal: Secondary | ICD-10-CM | POA: Diagnosis not present

## 2020-05-12 DIAGNOSIS — D485 Neoplasm of uncertain behavior of skin: Secondary | ICD-10-CM | POA: Diagnosis not present

## 2020-05-15 ENCOUNTER — Other Ambulatory Visit: Payer: Self-pay

## 2020-05-15 ENCOUNTER — Ambulatory Visit (INDEPENDENT_AMBULATORY_CARE_PROVIDER_SITE_OTHER): Payer: Medicare Other

## 2020-05-15 DIAGNOSIS — Z23 Encounter for immunization: Secondary | ICD-10-CM | POA: Diagnosis not present

## 2020-05-15 NOTE — Progress Notes (Signed)
   Covid-19 Vaccination Clinic  Name:  Amanda Shaw    MRN: [id number] DOB: Sep 07, 1929  05/15/2020  Amanda Shaw was observed post Covid-19 immunization for 15 minutes without incident. She was provided with Vaccine Information Sheet and instruction to access the V-Safe system.   Amanda Shaw was instructed to call 911 with any severe reactions post vaccine: Marland Kitchen Difficulty breathing  . Swelling of face and throat  . A fast heartbeat  . A bad rash all over body  . Dizziness and weakness   Covid Booster administered LD without complication.

## 2020-05-16 MED ORDER — HYDROCHLOROTHIAZIDE 12.5 MG PO TABS
12.5000 mg | ORAL_TABLET | Freq: Every day | ORAL | 0 refills | Status: DC
Start: 2020-05-16 — End: 2020-05-29

## 2020-05-16 MED ORDER — SIMVASTATIN 20 MG PO TABS
20.0000 mg | ORAL_TABLET | Freq: Every evening | ORAL | 0 refills | Status: DC
Start: 2020-05-16 — End: 2020-06-04

## 2020-05-29 ENCOUNTER — Other Ambulatory Visit: Payer: Self-pay

## 2020-05-30 MED ORDER — HYDROCHLOROTHIAZIDE 12.5 MG PO TABS
12.5000 mg | ORAL_TABLET | Freq: Every day | ORAL | 0 refills | Status: DC
Start: 2020-05-30 — End: 2020-08-29

## 2020-06-04 MED ORDER — SIMVASTATIN 20 MG PO TABS
20.0000 mg | ORAL_TABLET | Freq: Every evening | ORAL | 0 refills | Status: DC
Start: 2020-06-04 — End: 2020-06-09

## 2020-06-04 NOTE — Addendum Note (Signed)
Addended by: Christen Bame D on: 06/04/2020 02:26 PM   Modules accepted: Orders

## 2020-06-04 NOTE — Telephone Encounter (Signed)
Simvastatin did not go thru the first time.   Resent.  Attempted to call pt, but received a busy signal. Christen Bame, CMA

## 2020-06-09 ENCOUNTER — Other Ambulatory Visit: Payer: Self-pay | Admitting: *Deleted

## 2020-06-09 ENCOUNTER — Other Ambulatory Visit (HOSPITAL_COMMUNITY): Payer: Self-pay | Admitting: Dermatology

## 2020-06-09 DIAGNOSIS — C44219 Basal cell carcinoma of skin of left ear and external auricular canal: Secondary | ICD-10-CM | POA: Diagnosis not present

## 2020-06-09 DIAGNOSIS — Z85828 Personal history of other malignant neoplasm of skin: Secondary | ICD-10-CM | POA: Diagnosis not present

## 2020-06-09 MED ORDER — SIMVASTATIN 20 MG PO TABS
20.0000 mg | ORAL_TABLET | Freq: Every evening | ORAL | 0 refills | Status: DC
Start: 2020-06-09 — End: 2020-09-09

## 2020-06-09 MED FILL — DOXYCYCLINE HYCLATE 100 MG: 100 | 5 days supply | Qty: 10 | Fill #0

## 2020-06-25 DIAGNOSIS — Z85828 Personal history of other malignant neoplasm of skin: Secondary | ICD-10-CM | POA: Diagnosis not present

## 2020-06-25 DIAGNOSIS — D0439 Carcinoma in situ of skin of other parts of face: Secondary | ICD-10-CM | POA: Diagnosis not present

## 2020-06-25 DIAGNOSIS — D485 Neoplasm of uncertain behavior of skin: Secondary | ICD-10-CM | POA: Diagnosis not present

## 2020-06-25 DIAGNOSIS — L570 Actinic keratosis: Secondary | ICD-10-CM | POA: Diagnosis not present

## 2020-07-28 ENCOUNTER — Other Ambulatory Visit (HOSPITAL_COMMUNITY): Payer: Self-pay | Admitting: Dermatology

## 2020-07-28 DIAGNOSIS — Z85828 Personal history of other malignant neoplasm of skin: Secondary | ICD-10-CM | POA: Diagnosis not present

## 2020-07-28 DIAGNOSIS — C44329 Squamous cell carcinoma of skin of other parts of face: Secondary | ICD-10-CM | POA: Diagnosis not present

## 2020-07-28 MED FILL — DOXYCYCLINE HYCLATE 100 MG: 100 | 5 days supply | Qty: 10 | Fill #0

## 2020-07-29 DIAGNOSIS — I70213 Atherosclerosis of native arteries of extremities with intermittent claudication, bilateral legs: Secondary | ICD-10-CM | POA: Diagnosis not present

## 2020-07-29 DIAGNOSIS — M79671 Pain in right foot: Secondary | ICD-10-CM | POA: Diagnosis not present

## 2020-07-29 DIAGNOSIS — L84 Corns and callosities: Secondary | ICD-10-CM | POA: Diagnosis not present

## 2020-08-27 ENCOUNTER — Other Ambulatory Visit: Payer: Self-pay | Admitting: Family Medicine

## 2020-09-09 ENCOUNTER — Ambulatory Visit (INDEPENDENT_AMBULATORY_CARE_PROVIDER_SITE_OTHER): Payer: Medicare HMO | Admitting: Family Medicine

## 2020-09-09 ENCOUNTER — Other Ambulatory Visit: Payer: Self-pay

## 2020-09-09 VITALS — BP 135/75 | HR 75 | Wt 145.4 lb

## 2020-09-09 DIAGNOSIS — Z0000 Encounter for general adult medical examination without abnormal findings: Secondary | ICD-10-CM

## 2020-09-09 DIAGNOSIS — E785 Hyperlipidemia, unspecified: Secondary | ICD-10-CM | POA: Diagnosis not present

## 2020-09-09 DIAGNOSIS — E0590 Thyrotoxicosis, unspecified without thyrotoxic crisis or storm: Secondary | ICD-10-CM | POA: Diagnosis not present

## 2020-09-09 DIAGNOSIS — E739 Lactose intolerance, unspecified: Secondary | ICD-10-CM

## 2020-09-09 DIAGNOSIS — M810 Age-related osteoporosis without current pathological fracture: Secondary | ICD-10-CM

## 2020-09-09 DIAGNOSIS — I10 Essential (primary) hypertension: Secondary | ICD-10-CM

## 2020-09-09 DIAGNOSIS — R7301 Impaired fasting glucose: Secondary | ICD-10-CM | POA: Diagnosis not present

## 2020-09-09 LAB — POCT GLYCOSYLATED HEMOGLOBIN (HGB A1C): Hemoglobin A1C: 5.7 % — AB (ref 4.0–5.6)

## 2020-09-09 MED ORDER — SIMVASTATIN 20 MG PO TABS: 20.0000 mg | ORAL_TABLET | Freq: Every evening | ORAL | 3 refills | Status: DC

## 2020-09-09 MED ORDER — HYDROCHLOROTHIAZIDE 12.5 MG PO TABS: 12.5000 mg | ORAL_TABLET | Freq: Every day | ORAL | 3 refills | Status: DC

## 2020-09-09 NOTE — Patient Instructions (Signed)
It was great to see you again today!  Refilled simvastatin and HCTZ to [facility]  Stop alendronate once you run out  Checking bone density test  Follow up in 6 months for thyroid labs  Be well, Dr. Ardelia Mems

## 2020-09-09 NOTE — Progress Notes (Signed)
  Date of Visit: 09/09/2020   SUBJECTIVE:   HPI:  Amanda Shaw presents today for routine follow up/ a routine physical exam.  Osteoporosis - continues on alendronate weekly. She has now been on it for 5 years and is agreeable to a trial without it.  Hyperlipidemia - taking simvastatin 20mg  daily. Tolerating well.  Hypertension - taking hctz 12.5mg  twice daily, tolerating well.  OBJECTIVE:   BP 135/75   Pulse 75   Wt 145 lb 6.4 oz (66 kg)   SpO2 98%   BMI 25.59 kg/m  Gen: no acute distress, pleasant, cooperative HEENT: normocephalic, atraumatic  Heart: regular rate and rhythm, no murmur Lungs: clear to auscultation bilaterally, normal work of breathing  Abdomen: soft, nontender to palpation  Neuro: alert, grossly nonfocal, speech normal Ext: No appreciable lower extremity edema bilaterally Breasts: exam done by patient request. bilateral breasts normal in appearance. No erythema, deformity, or nipple discharge. No palpable abnormal masses. No axillary lymphadenopathy. Chaperone present - Mercer Pod CMA    ASSESSMENT/PLAN:   Health maintenance:  -current on HM items  Advance directives Discussed with patient and her husband today. They both would prefer to be DNR status. Given information on setting up advance directives.   Osteoporosis Has now completed 5 years of bisphosphonate therapy Will stop alendronate once she's used up all that she has Recheck DEXA Check vit D  HYPERTENSION, BENIGN SYSTEMIC Well controlled. Continue current medication regimen.  Subclinical hyperthyroidism Not on any medications. Update TSH, T3, free T4 today.  Hyperlipidemia Update lipids and CMET today. Continue simvastatin.  IMPAIRED GLUCOSE TOLERANCE Update A1c today  FOLLOW UP: Follow up in 6 mos for above issues  [place] J. Ardelia Mems, [place]

## 2020-09-10 LAB — T4, FREE: Free T4: 1.22 ng/dL (ref 0.82–1.77)

## 2020-09-10 LAB — CMP14+EGFR
ALT: 15 IU/L (ref 0–32)
AST: 25 IU/L (ref 0–40)
Albumin/Globulin Ratio: 1.6 (ref 1.2–2.2)
Albumin: 4.5 g/dL (ref 3.6–4.6)
Alkaline Phosphatase: 60 IU/L (ref 44–121)
BUN/Creatinine Ratio: 17 (ref 12–28)
BUN: 18 mg/dL (ref 8–27)
Bilirubin Total: 0.4 mg/dL (ref 0.0–1.2)
CO2: 26 mmol/L (ref 20–29)
Calcium: 10.4 mg/dL — ABNORMAL HIGH (ref 8.7–10.3)
Chloride: 102 mmol/L (ref 96–106)
Creatinine, Ser: 1.05 mg/dL — ABNORMAL HIGH (ref 0.57–1.00)
GFR calc Af Amer: 55 mL/min/{1.73_m2} — ABNORMAL LOW (ref >59)
GFR calc non Af Amer: 48 mL/min/{1.73_m2} — ABNORMAL LOW (ref >59)
Globulin, Total: 2.9 g/dL (ref 1.5–4.5)
Glucose: 99 mg/dL (ref 65–99)
Potassium: 4.4 mmol/L (ref 3.5–5.2)
Sodium: 145 mmol/L — ABNORMAL HIGH (ref 134–144)
Total Protein: 7.4 g/dL (ref 6.0–8.5)

## 2020-09-10 LAB — LIPID PANEL
Chol/HDL Ratio: 2.1 ratio (ref 0.0–4.4)
Cholesterol, Total: 157 mg/dL (ref 100–199)
HDL: 75 mg/dL (ref >39)
LDL Chol Calc (NIH): 67 mg/dL (ref 0–99)
Triglycerides: 76 mg/dL (ref 0–149)
VLDL Cholesterol Cal: 15 mg/dL (ref 5–40)

## 2020-09-10 LAB — TSH: TSH: 0.173 u[IU]/mL — ABNORMAL LOW (ref 0.450–4.500)

## 2020-09-10 LAB — T3: T3, Total: 155 ng/dL (ref 71–180)

## 2020-09-10 LAB — VITAMIN D 25 HYDROXY (VIT D DEFICIENCY, FRACTURES): Vit D, 25-Hydroxy: 46.1 ng/mL (ref 30.0–100.0)

## 2020-09-13 ENCOUNTER — Encounter: Payer: Self-pay | Admitting: Family Medicine

## 2020-09-13 NOTE — Assessment & Plan Note (Signed)
Well controlled. Continue current medication regimen.  

## 2020-09-13 NOTE — Assessment & Plan Note (Signed)
Update A1c today 

## 2020-09-13 NOTE — Assessment & Plan Note (Addendum)
Has now completed 5 years of bisphosphonate therapy Will stop alendronate once she's used up all that she has Recheck DEXA Check vit D

## 2020-09-13 NOTE — Assessment & Plan Note (Signed)
Update lipids and CMET today. Continue simvastatin.

## 2020-09-13 NOTE — Assessment & Plan Note (Signed)
Not on any medications. Update TSH, T3, free T4 today.

## 2020-09-19 ENCOUNTER — Encounter: Payer: Self-pay | Admitting: Family Medicine

## 2020-10-14 DIAGNOSIS — L84 Corns and callosities: Secondary | ICD-10-CM | POA: Diagnosis not present

## 2020-10-14 DIAGNOSIS — M79671 Pain in right foot: Secondary | ICD-10-CM | POA: Diagnosis not present

## 2020-10-14 DIAGNOSIS — I70213 Atherosclerosis of native arteries of extremities with intermittent claudication, bilateral legs: Secondary | ICD-10-CM | POA: Diagnosis not present

## 2020-10-24 DIAGNOSIS — M25551 Pain in right hip: Secondary | ICD-10-CM | POA: Diagnosis not present

## 2020-10-31 ENCOUNTER — Telehealth: Payer: Self-pay

## 2020-10-31 DIAGNOSIS — M7061 Trochanteric bursitis, right hip: Secondary | ICD-10-CM | POA: Diagnosis not present

## 2020-10-31 DIAGNOSIS — M25551 Pain in right hip: Secondary | ICD-10-CM | POA: Diagnosis not present

## 2020-10-31 NOTE — Telephone Encounter (Signed)
Received phone call from patient regarding treatment for back pain related to sciatica. Patient has been taking Aleve, one pill in the AM and one pill in the evening for the last week.   Patient was wanting to check if this is a safe treatment option. Advised that with NSAIDs we typically recommend short term use, however, would forward message to provider for further instructions.   Please advise.   Talbot Grumbling, RN

## 2020-11-03 NOTE — Telephone Encounter (Signed)
Agree, recommend only short term NSAIDs Patient should come in for appointment if not improving  Leeanne Rio, MD

## 2020-11-05 ENCOUNTER — Other Ambulatory Visit: Payer: Self-pay

## 2020-11-05 ENCOUNTER — Ambulatory Visit (INDEPENDENT_AMBULATORY_CARE_PROVIDER_SITE_OTHER): Payer: Medicare HMO

## 2020-11-05 DIAGNOSIS — Z23 Encounter for immunization: Secondary | ICD-10-CM

## 2020-12-23 DIAGNOSIS — I70213 Atherosclerosis of native arteries of extremities with intermittent claudication, bilateral legs: Secondary | ICD-10-CM | POA: Diagnosis not present

## 2020-12-23 DIAGNOSIS — M79671 Pain in right foot: Secondary | ICD-10-CM | POA: Diagnosis not present

## 2020-12-23 DIAGNOSIS — L84 Corns and callosities: Secondary | ICD-10-CM | POA: Diagnosis not present

## 2021-01-22 DIAGNOSIS — H02834 Dermatochalasis of left upper eyelid: Secondary | ICD-10-CM | POA: Diagnosis not present

## 2021-01-22 DIAGNOSIS — H40053 Ocular hypertension, bilateral: Secondary | ICD-10-CM | POA: Diagnosis not present

## 2021-01-22 DIAGNOSIS — H1045 Other chronic allergic conjunctivitis: Secondary | ICD-10-CM | POA: Diagnosis not present

## 2021-01-22 DIAGNOSIS — H2513 Age-related nuclear cataract, bilateral: Secondary | ICD-10-CM | POA: Diagnosis not present

## 2021-01-22 DIAGNOSIS — H04123 Dry eye syndrome of bilateral lacrimal glands: Secondary | ICD-10-CM | POA: Diagnosis not present

## 2021-01-22 DIAGNOSIS — H02831 Dermatochalasis of right upper eyelid: Secondary | ICD-10-CM | POA: Diagnosis not present

## 2021-01-22 DIAGNOSIS — H04202 Unspecified epiphora, left lacrimal gland: Secondary | ICD-10-CM | POA: Diagnosis not present

## 2021-02-03 ENCOUNTER — Ambulatory Visit
Admission: RE | Admit: 2021-02-03 | Discharge: 2021-02-03 | Disposition: A | Discharge status: Home / Self Care | Payer: Medicare HMO | Source: Ambulatory Visit | Attending: Family Medicine | Admitting: Family Medicine

## 2021-02-03 ENCOUNTER — Other Ambulatory Visit: Payer: Self-pay

## 2021-02-03 DIAGNOSIS — M810 Age-related osteoporosis without current pathological fracture: Secondary | ICD-10-CM

## 2021-02-03 DIAGNOSIS — Z780 Asymptomatic menopausal state: Secondary | ICD-10-CM | POA: Diagnosis not present

## 2021-02-03 DIAGNOSIS — M8589 Other specified disorders of bone density and structure, multiple sites: Secondary | ICD-10-CM | POA: Diagnosis not present

## 2021-03-03 DIAGNOSIS — L84 Corns and callosities: Secondary | ICD-10-CM | POA: Diagnosis not present

## 2021-03-03 DIAGNOSIS — M79671 Pain in right foot: Secondary | ICD-10-CM | POA: Diagnosis not present

## 2021-03-03 DIAGNOSIS — I70213 Atherosclerosis of native arteries of extremities with intermittent claudication, bilateral legs: Secondary | ICD-10-CM | POA: Diagnosis not present

## 2021-03-18 ENCOUNTER — Encounter: Payer: Self-pay | Admitting: Family Medicine

## 2021-03-24 ENCOUNTER — Telehealth: Payer: Self-pay

## 2021-03-24 NOTE — Telephone Encounter (Signed)
Patient's husband LVM regarding patient starting a new rx. Attempted to return call to patient. No answer, left HIPAA compliant VM for patient to return call to office to discuss further.   Talbot Grumbling, RN

## 2021-04-22 DIAGNOSIS — H40053 Ocular hypertension, bilateral: Secondary | ICD-10-CM | POA: Diagnosis not present

## 2021-05-12 ENCOUNTER — Telehealth: Payer: Self-pay | Admitting: *Deleted

## 2021-05-12 NOTE — Telephone Encounter (Signed)
Received call from patient stating that she didn't receive the results from her DEXA scan and was wondering if she needed to start back on the alendronate.  Will forward to MD to advise. Denisha Hoel,CMA

## 2021-05-15 NOTE — Telephone Encounter (Signed)
Called patient back, LVM on home line. Called mobile number and reached her husband, who says she is out of town. Will speak with patient next week - I will plan to call her some time then. I had sent her a letter in August with DEXA results and plan but it appears she may not have gotten it. Will speak with patient next week.  Leeanne Rio, MD

## 2021-05-19 DIAGNOSIS — M79672 Pain in left foot: Secondary | ICD-10-CM | POA: Diagnosis not present

## 2021-05-19 DIAGNOSIS — M79671 Pain in right foot: Secondary | ICD-10-CM | POA: Diagnosis not present

## 2021-05-19 DIAGNOSIS — M21612 Bunion of left foot: Secondary | ICD-10-CM | POA: Diagnosis not present

## 2021-05-19 DIAGNOSIS — L84 Corns and callosities: Secondary | ICD-10-CM | POA: Diagnosis not present

## 2021-05-19 DIAGNOSIS — M21611 Bunion of right foot: Secondary | ICD-10-CM | POA: Diagnosis not present

## 2021-05-19 DIAGNOSIS — M21622 Bunionette of left foot: Secondary | ICD-10-CM | POA: Diagnosis not present

## 2021-05-19 DIAGNOSIS — I739 Peripheral vascular disease, unspecified: Secondary | ICD-10-CM | POA: Diagnosis not present

## 2021-05-19 DIAGNOSIS — M21621 Bunionette of right foot: Secondary | ICD-10-CM | POA: Diagnosis not present

## 2021-05-20 NOTE — Telephone Encounter (Signed)
Patient returns call to nurse line. Information on DEXA results given to patient. Patient appreciative.

## 2021-07-28 DIAGNOSIS — I739 Peripheral vascular disease, unspecified: Secondary | ICD-10-CM | POA: Diagnosis not present

## 2021-07-28 DIAGNOSIS — L84 Corns and callosities: Secondary | ICD-10-CM | POA: Diagnosis not present

## 2021-09-02 ENCOUNTER — Other Ambulatory Visit: Payer: Self-pay | Admitting: Family Medicine

## 2021-09-10 ENCOUNTER — Ambulatory Visit (INDEPENDENT_AMBULATORY_CARE_PROVIDER_SITE_OTHER): Payer: Medicare HMO | Admitting: Family Medicine

## 2021-09-10 ENCOUNTER — Encounter: Payer: Self-pay | Admitting: Family Medicine

## 2021-09-10 ENCOUNTER — Other Ambulatory Visit: Payer: Self-pay

## 2021-09-10 VITALS — BP 144/64 | HR 71 | Ht 63.0 in | Wt 136.1 lb

## 2021-09-10 DIAGNOSIS — R7309 Other abnormal glucose: Secondary | ICD-10-CM

## 2021-09-10 DIAGNOSIS — E785 Hyperlipidemia, unspecified: Secondary | ICD-10-CM

## 2021-09-10 DIAGNOSIS — Z0000 Encounter for general adult medical examination without abnormal findings: Secondary | ICD-10-CM

## 2021-09-10 DIAGNOSIS — I159 Secondary hypertension, unspecified: Secondary | ICD-10-CM | POA: Diagnosis not present

## 2021-09-10 DIAGNOSIS — I10 Essential (primary) hypertension: Secondary | ICD-10-CM | POA: Diagnosis not present

## 2021-09-10 DIAGNOSIS — Z13220 Encounter for screening for lipoid disorders: Secondary | ICD-10-CM

## 2021-09-10 HISTORY — DX: Encounter for general adult medical examination without abnormal findings: Z00.00

## 2021-09-10 LAB — POCT GLYCOSYLATED HEMOGLOBIN (HGB A1C): Hemoglobin A1C: 5.7 % — AB (ref 4.0–5.6)

## 2021-09-10 NOTE — Patient Instructions (Signed)
Thank you for coming to see me today. It was a pleasure. You are doing great! Please stop aspirin as this is no longer needed.  We will get some labs today.  If they are abnormal or we need to do something about them, I will call you.  If they are normal, I will send you a message on MyChart (if it is active) or a letter in the mail.  If you don't hear from [place] in 2 weeks, please call the office at the number below.   Please follow-up with PCP as needed  If you have any questions or concerns, please do not hesitate to call the office at [phone].  Best wishes,   Dr Posey Pronto

## 2021-09-10 NOTE — Assessment & Plan Note (Addendum)
Slightly elevated in office today however readings are wnl at home. Continue HCTZ 12.5mg . Obtained BMP.

## 2021-09-10 NOTE — Progress Notes (Signed)
° ° ° °  SUBJECTIVE:   CHIEF COMPLAINT / HPI:   Amanda Shaw is a 88 y.o. female presents for physical  Hypertension Patient's current antihypertensive  medications include: HCTZ 12.5mg  Compliant with medications and tolerating well without side effects.  Checking BP at home with readings in 130/70s. Denies any SOB, CP, vision changes, LE edema, medication SEs, or symptoms of hypotension.   Most recent creatinine trend:  Lab Results  Component Value Date   CREATININE 1.05 (H) 09/09/2020   CREATININE 1.05 (H) 09/07/2019   CREATININE 0.99 08/09/2019    Patient has had a BMP in the past 1 year.  HLD Patient is currently taking simvastatin 20mg . Endorses compliance and tolerating well without side effects. Denies RUQ pain or myalgias.  Takes ASA 81mg  as it was recommended by previous PCP several years ago. No hx of CVA or MI.  [place] Office Visit from 09/10/2021 in [place]  PHQ-9 Total Score 0       Health Maintenance Due  Topic   Zoster Vaccines- Shingrix (1 of 2)    PERTINENT  PMH / PSH: HTN, HLD  OBJECTIVE:   BP (!) 144/64    Pulse 71    Ht 5\' 3"  (1.6 m)    Wt 136 lb 2 oz (61.7 kg)    SpO2 98%    BMI 24.11 kg/m    General: Alert, no acute distress, well appearing Cardio: Normal S1 and S2, RRR, no r/m/g Pulm: CTAB, normal work of breathing Abdomen: Bowel sounds normal. Abdomen soft and non-tender.  Extremities: No peripheral edema.  Neuro: Cranial nerves grossly intact   ASSESSMENT/PLAN:   Hyperlipidemia Obtained lipid panel today. Continue statin.  HYPERTENSION, BENIGN SYSTEMIC Slightly elevated in office today however readings are wnl at home. Continue HCTZ 12.5mg . Obtained BMP.   Healthcare maintenance Recommended shingles vaccines at the pharmacy. Other vaccines are up to date. Recommended stopping ASA 81mg  as no hx of CVA or MI, risk outweighs benefit at her age.   Lattie Haw, MD PGY-3 [place]

## 2021-09-10 NOTE — Assessment & Plan Note (Signed)
Obtained lipid panel today. Continue statin.

## 2021-09-10 NOTE — Assessment & Plan Note (Addendum)
Recommended shingles vaccines at the pharmacy. Other vaccines are up to date. Recommended stopping ASA 81mg  as no hx of CVA or MI, risk outweighs benefit at her age.

## 2021-09-11 ENCOUNTER — Encounter: Payer: Self-pay | Admitting: Family Medicine

## 2021-09-11 LAB — LIPID PANEL
Chol/HDL Ratio: 2.1 ratio (ref 0.0–4.4)
Cholesterol, Total: 149 mg/dL (ref 100–199)
HDL: 71 mg/dL (ref >39)
LDL Chol Calc (NIH): 64 mg/dL (ref 0–99)
Triglycerides: 68 mg/dL (ref 0–149)
VLDL Cholesterol Cal: 14 mg/dL (ref 5–40)

## 2021-09-11 LAB — BASIC METABOLIC PANEL
BUN/Creatinine Ratio: 27 (ref 12–28)
BUN: 25 mg/dL (ref 8–27)
CO2: 25 mmol/L (ref 20–29)
Calcium: 10.3 mg/dL (ref 8.7–10.3)
Chloride: 100 mmol/L (ref 96–106)
Creatinine, Ser: 0.92 mg/dL (ref 0.57–1.00)
Glucose: 94 mg/dL (ref 70–99)
Potassium: 4.1 mmol/L (ref 3.5–5.2)
Sodium: 142 mmol/L (ref 134–144)
eGFR: 60 mL/min/{1.73_m2} (ref >59)

## 2021-09-15 ENCOUNTER — Encounter: Payer: Self-pay | Admitting: Family Medicine

## 2021-11-09 ENCOUNTER — Ambulatory Visit (INDEPENDENT_AMBULATORY_CARE_PROVIDER_SITE_OTHER): Payer: Medicare HMO | Admitting: Family Medicine

## 2021-11-09 ENCOUNTER — Other Ambulatory Visit (HOSPITAL_COMMUNITY): Payer: Self-pay

## 2021-11-09 ENCOUNTER — Encounter: Payer: Self-pay | Admitting: Family Medicine

## 2021-11-09 VITALS — BP 146/83 | HR 92 | Ht 63.0 in | Wt 136.4 lb

## 2021-11-09 DIAGNOSIS — B372 Candidiasis of skin and nail: Secondary | ICD-10-CM

## 2021-11-09 HISTORY — DX: Candidiasis of skin and nail: B37.2

## 2021-11-09 MED ORDER — NYSTATIN 100000 UNIT/GM EX POWD
1.0000 | Freq: Three times a day (TID) | CUTANEOUS | 0 refills | Status: DC
Start: 2021-11-09 — End: 2022-06-14
  Filled 2021-11-09: qty 15, 5d supply, fill #0

## 2021-11-09 NOTE — Progress Notes (Signed)
? ? ?  SUBJECTIVE:  ? ?CHIEF COMPLAINT / HPI: rash on left hip ? ?Red rash on anterior left hip for 2-3 months.  Denies any pain, itching or spreading of rash.  Has been using triple antibiotic ointment, neosporin and red oil for 2 months without relief.  Applied Diclofenac gel today.  Denies any fevers, blistering, similar rash on other part of body.   ? ?PERTINENT  PMH / PSH:  ?None ?OBJECTIVE:  ? ?BP (!) 146/83   Pulse 92   Ht '5\' 3"'$  (1.6 m)   Wt 136 lb 6.4 oz (61.9 kg)   SpO2 98%   BMI 24.16 kg/m?   ? ?General: Alert, no acute distress ?Left upper hip: erythema with satellite lesions without drainage, open areas or blistering. ? ? ? ? ?ASSESSMENT/PLAN:  ? ?Candida infection of flexural skin ?Considered Shingles but less likely given no blistering or painful lesions.  Rash most consistent with candidal infection. ?-Nystatin powder three times a day x 5 days ?-Stop all current topical self treatment ?-If no improvement can consider Erythrasma in differential and treat as appropriate. ?  ? ? ?Carollee Leitz, MD ?[place]  ?

## 2021-11-09 NOTE — Patient Instructions (Signed)
Thank you for coming to see me today. It was a pleasure.  ? ?You have a yeast infection on your skin. ?Use Nystatin powder 3 times a day for 5 days. If no improvement please notify PCP. ? ?Please follow-up with PCP as needed or if symptoms worsens ? ?If you have any questions or concerns, please do not hesitate to call the office at [phone]. ? ?Best,  ? ?Carollee Leitz, MD   ? ?Skin Yeast Infection ?A skin yeast infection is a condition in which there is an overgrowth of yeast (Candida) that normally lives on the skin. This condition usually occurs in areas of the skin that are constantly warm and moist, such as the skin under the breasts or armpits, or in the groin and other body folds. ?What are the causes? ?This condition is caused by a change in the normal balance of the yeast that live on the skin. ?What increases the risk? ?You are more likely to develop this condition if you: ?Are obese. ?Are pregnant. ?Are 86 years of age or older. ?Wear tight clothing. ?Have any of the following conditions: ?Diabetes. ?Malnutrition. ?A weak body defense system (immune system). ?Take medicines such as: ?Birth control pills. ?Antibiotics. ?Steroid medicines. ?What are the signs or symptoms? ?The most common symptom of this condition is itchiness in the affected area. Other symptoms include: ?A red, swollen area of the skin. ?Bumps on the skin. ?How is this diagnosed? ?This condition is diagnosed with a medical history and physical exam. Your health care provider may check for yeast by taking scrapings of the skin to be viewed under a microscope. ?How is this treated? ?This condition is treated with medicine. Medicines may be prescribed or available over the counter. The medicines may be: ?Taken by mouth (orally). ?Applied as a cream or powder to your skin. ?Follow these instructions at home: ? ?Take or apply over-the-counter and prescription medicines only as told by your health care provider. ?Maintain a healthy weight.  If you need help losing weight, talk with your health care provider. ?Keep your skin clean and dry. ?Wear loose-fitting clothing. ?If you have diabetes, keep your blood sugar under control. ?Keep all follow-up visits. This is important. ?Contact a health care provider if: ?Your symptoms go away and then come back. ?Your symptoms do not get better with treatment. ?Your symptoms get worse. ?Your rash spreads. ?You have a fever or chills. ?You have new symptoms. ?You have new warmth or redness of your skin. ?Your rash is painful or bleeding. ?Summary ?A skin yeast infection is a condition in which there is an overgrowth of yeast (Candida) that normally lives on the skin. ?Take or apply over-the-counter and prescription medicines only as told by your health care provider. ?Keep your skin clean and dry. ?Contact a health care provider if your symptoms do not get better with treatment. ?This information is not intended to replace advice given to you by your health care provider. Make sure you discuss any questions you have with your health care provider. ?Document Revised: 10/07/2020 Document Reviewed: 10/07/2020 ?Elsevier Patient Education ? [place]. ? ?

## 2021-11-09 NOTE — Assessment & Plan Note (Signed)
Considered Shingles but less likely given no blistering or painful lesions.  Rash most consistent with candidal infection. ?-Nystatin powder three times a day x 5 days ?-Stop all current topical self treatment ?-If no improvement can consider Erythrasma in differential and treat as appropriate. ?

## 2021-11-27 DIAGNOSIS — I739 Peripheral vascular disease, unspecified: Secondary | ICD-10-CM | POA: Diagnosis not present

## 2021-11-27 DIAGNOSIS — L84 Corns and callosities: Secondary | ICD-10-CM | POA: Diagnosis not present

## 2021-11-27 DIAGNOSIS — L603 Nail dystrophy: Secondary | ICD-10-CM | POA: Diagnosis not present

## 2021-11-27 DIAGNOSIS — B351 Tinea unguium: Secondary | ICD-10-CM | POA: Diagnosis not present

## 2021-12-24 ENCOUNTER — Other Ambulatory Visit: Payer: Self-pay | Admitting: Family Medicine

## 2022-01-21 DIAGNOSIS — H02831 Dermatochalasis of right upper eyelid: Secondary | ICD-10-CM | POA: Diagnosis not present

## 2022-01-21 DIAGNOSIS — H40053 Ocular hypertension, bilateral: Secondary | ICD-10-CM | POA: Diagnosis not present

## 2022-01-21 DIAGNOSIS — H04123 Dry eye syndrome of bilateral lacrimal glands: Secondary | ICD-10-CM | POA: Diagnosis not present

## 2022-01-21 DIAGNOSIS — H02834 Dermatochalasis of left upper eyelid: Secondary | ICD-10-CM | POA: Diagnosis not present

## 2022-01-21 DIAGNOSIS — H1045 Other chronic allergic conjunctivitis: Secondary | ICD-10-CM | POA: Diagnosis not present

## 2022-01-21 DIAGNOSIS — H04202 Unspecified epiphora, left lacrimal gland: Secondary | ICD-10-CM | POA: Diagnosis not present

## 2022-01-21 DIAGNOSIS — H2513 Age-related nuclear cataract, bilateral: Secondary | ICD-10-CM | POA: Diagnosis not present

## 2022-02-26 DIAGNOSIS — I739 Peripheral vascular disease, unspecified: Secondary | ICD-10-CM | POA: Diagnosis not present

## 2022-02-26 DIAGNOSIS — L603 Nail dystrophy: Secondary | ICD-10-CM | POA: Diagnosis not present

## 2022-02-26 DIAGNOSIS — L84 Corns and callosities: Secondary | ICD-10-CM | POA: Diagnosis not present

## 2022-02-26 DIAGNOSIS — B351 Tinea unguium: Secondary | ICD-10-CM | POA: Diagnosis not present

## 2022-05-20 DIAGNOSIS — Z23 Encounter for immunization: Secondary | ICD-10-CM | POA: Diagnosis not present

## 2022-05-28 DIAGNOSIS — B351 Tinea unguium: Secondary | ICD-10-CM | POA: Diagnosis not present

## 2022-05-28 DIAGNOSIS — L603 Nail dystrophy: Secondary | ICD-10-CM | POA: Diagnosis not present

## 2022-05-28 DIAGNOSIS — I739 Peripheral vascular disease, unspecified: Secondary | ICD-10-CM | POA: Diagnosis not present

## 2022-05-28 DIAGNOSIS — L84 Corns and callosities: Secondary | ICD-10-CM | POA: Diagnosis not present

## 2022-06-10 ENCOUNTER — Encounter: Payer: Self-pay | Admitting: Student

## 2022-06-10 ENCOUNTER — Other Ambulatory Visit (HOSPITAL_COMMUNITY): Payer: Self-pay

## 2022-06-10 ENCOUNTER — Ambulatory Visit (INDEPENDENT_AMBULATORY_CARE_PROVIDER_SITE_OTHER): Payer: Medicare HMO | Admitting: Student

## 2022-06-10 VITALS — BP 140/70 | HR 88 | Temp 98.8°F | Ht 63.0 in | Wt 130.0 lb

## 2022-06-10 DIAGNOSIS — I10 Essential (primary) hypertension: Secondary | ICD-10-CM | POA: Diagnosis not present

## 2022-06-10 DIAGNOSIS — R051 Acute cough: Secondary | ICD-10-CM

## 2022-06-10 MED ORDER — GUAIFENESIN ER 600 MG PO TB12
600.0000 mg | ORAL_TABLET | Freq: Two times a day (BID) | ORAL | 0 refills | Status: AC | PRN
  Filled 2022-06-10: qty 30, 15d supply, fill #0

## 2022-06-10 MED ORDER — BENZONATATE 100 MG PO CAPS
100.0000 mg | ORAL_CAPSULE | Freq: Two times a day (BID) | ORAL | 0 refills | Status: DC | PRN
  Filled 2022-06-10: qty 30, 15d supply, fill #0

## 2022-06-10 NOTE — Progress Notes (Signed)
  SUBJECTIVE:   CHIEF COMPLAINT / HPI:   URI:  Patient presents with a cough and runny nose onset 1 week ago, sick contact of her husband who also had a cough over a week ago. Patient denies any fevers or chills. She has also not had any dyspnea or difficulty breathing, or chest pain. Denies abdominal symptoms. She is fully vaccinated against flu and COVID. She is requesting Guaifenisin and tessalon pearls as they have provided some benefit in her.  PERTINENT  PMH / PSH:  HTN, subclinical hyperthyroidism, spinal stenosis, HLD, OA, osteoporosis, venous insufficiency   OBJECTIVE:  BP (!) 140/70   Pulse 88   Temp 98.8 F (37.1 C) (Oral)   Ht '5\' 3"'$  (1.6 m)   Wt 130 lb (59 kg)   SpO2 98%   BMI 23.03 kg/m  Physical Exam Constitutional:      Appearance: Normal appearance.  HENT:     Right Ear: External ear normal.     Left Ear: External ear normal.     Nose: Nose normal.  Eyes:     Conjunctiva/sclera: Conjunctivae normal.  Cardiovascular:     Rate and Rhythm: Normal rate and regular rhythm.  Pulmonary:     Effort: Pulmonary effort is normal.     Breath sounds: Normal breath sounds.  Abdominal:     General: There is no distension.     Palpations: Abdomen is soft.  Musculoskeletal:        General: Normal range of motion.  Skin:    General: Skin is warm.     Capillary Refill: Capillary refill takes less than 2 seconds.  Neurological:     Mental Status: She is alert.      ASSESSMENT/PLAN:  Acute cough Assessment & Plan: Common cold is usually self-limiting and starts within the first 48 hours with accompanying symptoms which usually resolve within 2 weeks. Acute cough could also be caused by an exacerbation of an upper airway cough syndrome secondary to rhinosinusitis, asthma, COPD, or pneumonia. Unlikely PNA with no focal diminishment on exam or systemic symptoms. No history of COPD or asthma. No signs of GERD (treat with PPI), or rhinosinusitis. Honey can provide symptomatic  relief, can also try humidifier at home, Tylenol/Ibuprofen as needed. Patient was Guaifenesin and tessalon pearls with strict return precautions. If symptoms remain in the next couple of weeks or worsen, patient was instructed to return.      Orders: -     Benzonatate; Take 1 capsule (100 mg total) by mouth 2 (two) times daily as needed for cough.  Dispense: 30 capsule; Refill: 0 -     guaiFENesin ER; Take 1 tablet (600 mg total) by mouth 2 (two) times daily as needed for up to 15 days.  Dispense: 30 tablet; Refill: 0  HYPERTENSION, BENIGN SYSTEMIC Assessment & Plan: Pressures low at home, instructed to continue monitoring and antihypertensive. If elevated at home, let us know.    Return if symptoms worsen or fail to improve. Erskine Emery, MD 06/11/2022, 12:45 PM PGY-2, [place]

## 2022-06-10 NOTE — Patient Instructions (Addendum)
It was great to see you today! Thank you for choosing [facility] for your primary care. Amanda Shaw was seen for cough and cold like symptoms.  Today we addressed: Continue with the Guaifenesin and Tessalon pearls AS INSTRUCTED  Please return if you experience Shortness of breath, fever, chills, chest pain   If you haven't already, sign up for My Chart to have easy access to your labs results, and communication with your primary care physician.  I recommend that you always bring your medications to each appointment as this makes it easy to ensure you are on the correct medications and helps [place] not miss refills when you need them. Call the clinic at [phone] if your symptoms worsen or you have any concerns.  You should return to our clinic Return if symptoms worsen or fail to improve. Please arrive 15 minutes before your appointment to ensure smooth check in process.  We appreciate your efforts in making this happen.  Thank you for allowing me to participate in your care, Erskine Emery, MD 06/10/2022, 11:40 AM PGY-2, [place]

## 2022-06-11 DIAGNOSIS — R051 Acute cough: Secondary | ICD-10-CM | POA: Insufficient documentation

## 2022-06-11 NOTE — Assessment & Plan Note (Signed)
Common cold is usually self-limiting and starts within the first 48 hours with accompanying symptoms which usually resolve within 2 weeks. Acute cough could also be caused by an exacerbation of an upper airway cough syndrome secondary to rhinosinusitis, asthma, COPD, or pneumonia. Unlikely PNA with no focal diminishment on exam or systemic symptoms. No history of COPD or asthma. No signs of GERD (treat with PPI), or rhinosinusitis. Honey can provide symptomatic relief, can also try humidifier at home, Tylenol/Ibuprofen as needed. Patient was Guaifenesin and tessalon pearls with strict return precautions. If symptoms remain in the next couple of weeks or worsen, patient was instructed to return.

## 2022-06-11 NOTE — Assessment & Plan Note (Signed)
Pressures low at home, instructed to continue monitoring and antihypertensive. If elevated at home, let us know.

## 2022-06-14 ENCOUNTER — Encounter: Payer: Self-pay | Admitting: Family Medicine

## 2022-06-14 ENCOUNTER — Other Ambulatory Visit (HOSPITAL_COMMUNITY): Payer: Self-pay

## 2022-06-14 ENCOUNTER — Ambulatory Visit (INDEPENDENT_AMBULATORY_CARE_PROVIDER_SITE_OTHER): Payer: Medicare HMO | Admitting: Family Medicine

## 2022-06-14 VITALS — BP 130/70 | HR 98 | Ht 63.0 in | Wt 131.6 lb

## 2022-06-14 DIAGNOSIS — M810 Age-related osteoporosis without current pathological fracture: Secondary | ICD-10-CM | POA: Diagnosis not present

## 2022-06-14 DIAGNOSIS — R051 Acute cough: Secondary | ICD-10-CM

## 2022-06-14 DIAGNOSIS — E0590 Thyrotoxicosis, unspecified without thyrotoxic crisis or storm: Secondary | ICD-10-CM

## 2022-06-14 MED ORDER — AZITHROMYCIN 250 MG PO TABS
ORAL_TABLET | ORAL | 0 refills | Status: AC
  Filled 2022-06-14: qty 6, 5d supply, fill #0

## 2022-06-14 MED ORDER — AMOXICILLIN-POT CLAVULANATE 875-125 MG PO TABS
1.0000 | ORAL_TABLET | Freq: Two times a day (BID) | ORAL | 0 refills | Status: DC
  Filled 2022-06-14: qty 14, 7d supply, fill #0

## 2022-06-14 NOTE — Patient Instructions (Addendum)
It was great to see you again today!  Sent in 2 different antibiotics, start both of them today.  Azithromycin - 2 pills today, then one pill daily starting tomorrow for 4 more days Augmentin (amoxicillin-clavulanic acid) - 1 pill twice daily for 7 days  Ordered bone density test - please call [phone] to schedule it ([facility]) They are located at [address], [address].  Schedule lab visit to check thyroid labs  Let us know if not feeling better in the next 3 or so days  Be well, Dr. Ardelia Mems

## 2022-06-14 NOTE — Progress Notes (Signed)
  Date of Visit: 06/14/2022   SUBJECTIVE:   HPI:  Amanda Shaw presents today for a same day appointment to discuss ongoing cough.  Seen 4 days ago for cough and congestion. Reports symptoms began about 2 weeks ago and are no better. Was given rx for tessalon as well as guaifenesin without relief. No fevers. Cough is frustrating, especially at night. Eating and drinking well.   Osteoporosis - agreeable to scheduling DEXA. Is on bisphosphonate holiday  Subclinical hyperthyroidism - due for thyroid labs  OBJECTIVE:   BP 130/70   Pulse 98   Ht '5\' 3"'$  (1.6 m)   Wt 131 lb 9.6 oz (59.7 kg)   SpO2 97%   BMI 23.31 kg/m  Gen: no acute distress pleasant cooperative HEENT: normocephalic, atraumatic. Oropharynx clear, mildly erythematous. Tympanic membranes clear bilaterally. Nares with mild congestion. No anterior cervical or supraclavicular lymphadenopathy.  Heart: regular rate and rhythm, no murmur Lungs: clear to auscultation bilaterally, normal work of breathing  Neuro: alert speech normal, grossly nonfocal  ASSESSMENT/PLAN:   Health maintenance:  -advised to schedule AWV  Cough Given duration of symptoms along with ongoing congestion, favor bacterial process at this point.  Lungs are clear on exam, but I do think she would benefit from treatment with antibiotics.  We will treat presumptively for community-acquired pneumonia with Augmentin and azithromycin.  Discussed option of obtaining chest x-ray, however we decided to empirically treat.  If symptoms or not improving in the next several days I would at that point recommended chest x-ray.  Patient and husband are agreeable to this plan.  Subclinical hyperthyroidism Ordered labs, will return when feeling better for lab draw. Check TSH, T3, free T4  Osteoporosis On bisphosphonate holiday.  DEXA scan ordered.  Given instructions on how to schedule.   FOLLOW UP: Follow up as needed if symptoms worsen or fail to improve.    [place]. Ardelia Mems, [place]

## 2022-06-15 NOTE — Assessment & Plan Note (Signed)
Ordered labs, will return when feeling better for lab draw. Check TSH, T3, free T4

## 2022-06-15 NOTE — Assessment & Plan Note (Signed)
On bisphosphonate holiday.  DEXA scan ordered.  Given instructions on how to schedule.

## 2022-06-17 ENCOUNTER — Other Ambulatory Visit: Payer: Medicare HMO

## 2022-06-17 DIAGNOSIS — E0590 Thyrotoxicosis, unspecified without thyrotoxic crisis or storm: Secondary | ICD-10-CM | POA: Diagnosis not present

## 2022-06-18 ENCOUNTER — Encounter: Payer: Self-pay | Admitting: Family Medicine

## 2022-06-18 LAB — T3: T3, Total: 166 ng/dL (ref 71–180)

## 2022-06-18 LAB — TSH: TSH: 0.085 u[IU]/mL — ABNORMAL LOW (ref 0.450–4.500)

## 2022-06-18 LAB — T4, FREE: Free T4: 1.26 ng/dL (ref 0.82–1.77)

## 2022-08-13 ENCOUNTER — Other Ambulatory Visit: Payer: Self-pay | Admitting: Family Medicine

## 2022-09-01 DIAGNOSIS — L603 Nail dystrophy: Secondary | ICD-10-CM | POA: Diagnosis not present

## 2022-09-01 DIAGNOSIS — I739 Peripheral vascular disease, unspecified: Secondary | ICD-10-CM | POA: Diagnosis not present

## 2022-09-01 DIAGNOSIS — L84 Corns and callosities: Secondary | ICD-10-CM | POA: Diagnosis not present

## 2022-09-01 DIAGNOSIS — B351 Tinea unguium: Secondary | ICD-10-CM | POA: Diagnosis not present

## 2022-09-20 ENCOUNTER — Other Ambulatory Visit: Payer: Self-pay | Admitting: Family Medicine

## 2022-10-29 ENCOUNTER — Other Ambulatory Visit: Payer: Self-pay | Admitting: Family Medicine

## 2022-11-23 ENCOUNTER — Ambulatory Visit (INDEPENDENT_AMBULATORY_CARE_PROVIDER_SITE_OTHER): Payer: Medicare HMO | Admitting: Family Medicine

## 2022-11-23 VITALS — BP 132/74 | HR 92 | Ht 63.0 in | Wt 127.6 lb

## 2022-11-23 DIAGNOSIS — H0015 Chalazion left lower eyelid: Secondary | ICD-10-CM | POA: Diagnosis not present

## 2022-11-23 DIAGNOSIS — Z23 Encounter for immunization: Secondary | ICD-10-CM

## 2022-11-23 DIAGNOSIS — L247 Irritant contact dermatitis due to plants, except food: Secondary | ICD-10-CM

## 2022-11-23 DIAGNOSIS — Z0000 Encounter for general adult medical examination without abnormal findings: Secondary | ICD-10-CM

## 2022-11-23 MED ORDER — ZOSTER VAC RECOMB ADJUVANTED 50 MCG/0.5ML IM SUSR: 0.5000 mL | Freq: Once | INTRAMUSCULAR | 0 refills | Status: DC

## 2022-11-23 NOTE — Progress Notes (Signed)
   SUBJECTIVE:   CHIEF COMPLAINT / HPI:   Eye complaint and two skin lesions Ms. Mallo is a pleasant 89 year old woman here today with multiple complaints all starting on Friday after she was out working in the yard.  Her daughter reports that she poked herself in the eye with a stick, although the patient herself says that she does not believe she did this.  In the yard, she was removing some brush in an area with known poison oak.  In terms of her eye, she has noticed some redness and swelling over the left lower lid.  She has no blurry vision, eye pain, watery eye, conjunctivitis, vision change, or other disorder.  Only intervention so far is a warm compress, with mild relief.  There are 2 skin lesions, 1 on each medial calf.  She noticed them when taking a bath Friday after working in the yard.  They have not bothered her at all.  Denies pruritus, discharge, surrounding erythema or warmth.   Denies fever, chills, decreased intake, nausea or vomiting.  PERTINENT  PMH / PSH:  Patient Active Problem List   Diagnosis Date Noted   Irritant contact dermatitis due to plants, except food 11/23/2022   Chalazion left lower eyelid 11/23/2022   Need for shingles vaccine 11/23/2022   Healthcare maintenance 09/10/2021   History of squamous cell carcinoma 09/17/2017   Cold thyroid nodule 02/11/2014   Subclinical hyperthyroidism 01/18/2014   Osteoporosis 06/08/2012   Spinal stenosis of lumbar region 06/08/2012   Osteoarthritis of hip 01/29/2011   IMPAIRED GLUCOSE TOLERANCE 06/11/2010   Venous (peripheral) insufficiency 07/22/2009   Hyperlipidemia 09/29/2006   Overweight(278.02) 09/29/2006   HYPERTENSION, BENIGN SYSTEMIC 09/29/2006   FIBROADENOSIS, BREAST 09/29/2006   CYSTOCELE/RECTOCELE/PROLAPSE,UNSPEC. 09/29/2006   Glaucoma 09/29/2006    OBJECTIVE:   BP 132/74   Pulse 92   Ht  (1.6 m)   Wt 127 lb 9.6 oz (57.9 kg)   SpO2 98%   BMI 22.60 kg/m    General: Awake, alert,  pleasant and animated HEENT: Erythematous swelling of left lower eyelid along the lash line, bilateral sclera without injection or icterus, EOM intact Neck: No palpable lymphadenopathy Neuro: PERRL, EOM intact, peripheral vision grossly intact (mistook 3 fingers for 4 in two fields) Skin: Left medial calf with flat hyperpigmented rectangle large shaped patch without any overlying scale, erythema, or swelling.  Right medial calf with small firm mobile area underneath scab and slight erythema inferiorly       ASSESSMENT/PLAN:   Chalazion left lower eyelid Appears to be uncomplicated chalazion.  No red flags, vision intact and ocular neuro intact.  No antibiotics recommended at this time.  Continue with warm compresses 3 times daily until resolved.  Irritant contact dermatitis due to plants, except food Given she was working out in the yard and discovered these after, suspect contact dermatitis of some sort.  Does not appear to have overlying infection.  Honestly does not bother patient much at all. - Continue triamcinolone cream twice daily until resolved - Can use Benadryl cream overlying if itchy  Need for shingles vaccine Provided printed prescription for Shingrix vaccine.  Healthcare maintenance Provided AVS handout on Medicare annual wellness visit due.  Sent message to admin to call and book this appointment.    Fayette Pho, MD [facility]

## 2022-11-23 NOTE — Assessment & Plan Note (Signed)
Given she was working out in the yard and discovered these after, suspect contact dermatitis of some sort.  Does not appear to have overlying infection.  Honestly does not bother patient much at all. - Continue triamcinolone cream twice daily until resolved - Can use Benadryl cream overlying if itchy

## 2022-11-23 NOTE — Patient Instructions (Addendum)
It was wonderful to see you today. Thank you for allowing me to be a part of your care. Below is a short summary of what we discussed at your visit today:  Eye injury This is called a chalazion. You can think of it as a pimple of the lower eyelid.  Continue to do warm compresses three times daily until it resolves.  I expect it to resolve in about 3 to 5 days.   Leg rash spots The left leg looks like a contact dermatitis, likely from a plant in the yard you came into contact with.  The right leg looks like a bug bite that has been scratched a little.  Neither appear infected at this time.  Apply the triamcinolone cream twice daily for 7 days. I expect it to clear up by the end of the week.   Shingles Vaccine I have written a prescription for the shingles vaccine.  Please take this to your pharmacy to have this administered. Your insurance prefers you get this specific vaccine at the pharmacy instead of in clinic.   Medicare Annual Wellness Exam Your chart indicates that you are due for your Medicare annual wellness exam.  Your insurance likes [place] to do one of these every year.  This is a nurse only visit that takes about 30 minutes to an hour.  It can be done either in person or virtually.  This is an in-depth visit that focuses on preventative care and keeping you healthy.  Somebody from our clinic will be calling you soon to get this scheduled.  Please bring all of your medications to every appointment!  If you have any questions or concerns, please do not hesitate to contact us via phone or MyChart message.   Fayette Pho, MD

## 2022-11-23 NOTE — Assessment & Plan Note (Signed)
Appears to be uncomplicated chalazion.  No red flags, vision intact and ocular neuro intact.  No antibiotics recommended at this time.  Continue with warm compresses 3 times daily until resolved.

## 2022-11-23 NOTE — Assessment & Plan Note (Signed)
Provided printed prescription for Shingrix vaccine.

## 2022-11-23 NOTE — Assessment & Plan Note (Signed)
Provided AVS handout on Medicare annual wellness visit due.  Sent message to admin to call and book this appointment.

## 2022-11-29 DIAGNOSIS — L84 Corns and callosities: Secondary | ICD-10-CM | POA: Diagnosis not present

## 2022-11-29 DIAGNOSIS — L603 Nail dystrophy: Secondary | ICD-10-CM | POA: Diagnosis not present

## 2022-11-29 DIAGNOSIS — I739 Peripheral vascular disease, unspecified: Secondary | ICD-10-CM | POA: Diagnosis not present

## 2022-11-29 DIAGNOSIS — B351 Tinea unguium: Secondary | ICD-10-CM | POA: Diagnosis not present

## 2022-12-07 ENCOUNTER — Other Ambulatory Visit: Payer: Self-pay | Admitting: Family Medicine

## 2022-12-20 ENCOUNTER — Telehealth: Payer: Self-pay | Admitting: Family Medicine

## 2022-12-20 NOTE — Telephone Encounter (Signed)
Called patient to schedule Medicare Annual Wellness Visit (AWV). Left message for patient to call back and schedule Medicare Annual Wellness Visit (AWV).  Last date of AWV: 07/06/2019  If patient calls back please forward message to me or secure chat me to assist scheduling.  If any questions, please contact me at [phone].  Thank you ,  [place] A [place]

## 2023-01-03 ENCOUNTER — Ambulatory Visit (INDEPENDENT_AMBULATORY_CARE_PROVIDER_SITE_OTHER): Payer: Medicare HMO

## 2023-01-03 VITALS — Ht 63.0 in | Wt 127.0 lb

## 2023-01-03 DIAGNOSIS — Z0000 Encounter for general adult medical examination without abnormal findings: Secondary | ICD-10-CM

## 2023-01-03 NOTE — Progress Notes (Signed)
Subjective:   Amanda Shaw is a 89 y.o. female who presents for Medicare Annual (Subsequent) preventive examination.  I connected with  Gabriel Rainwater on 01/03/23 by a audio enabled telemedicine application and verified that I am speaking with the correct person using two identifiers.  Patient Location: Home  Provider Location: Home Office  I discussed the limitations of evaluation and management by telemedicine. The patient expressed understanding and agreed to proceed.  Review of Systems     Cardiac Risk Factors include: advanced age (>47men, >24 women);hypertension     Objective:    Today's Vitals   01/03/23 1023  Weight: 127 lb (57.6 kg)  Height: 5\' 3"  (1.6 m)   Body mass index is 22.5 kg/m.     01/03/2023   11:28 AM 11/23/2022    3:29 PM 06/14/2022    9:04 AM 06/10/2022   11:22 AM 11/09/2021    2:26 PM 09/10/2021   10:07 AM 07/06/2019    9:38 AM  Advanced Directives  Does Patient Have a Medical Advance Directive? No No No No No No No  Would patient like information on creating a medical advance directive? Yes (MAU/Ambulatory/Procedural Areas - Information given) No - Patient declined  No - Patient declined No - Patient declined No - Patient declined Yes (MAU/Ambulatory/Procedural Areas - Information given)    Current Medications (verified) Outpatient Encounter Medications as of 01/03/2023  Medication Sig   aspirin 81 MG tablet Take 81 mg by mouth daily.   Calcium Carbonate-Vitamin D (CALTRATE 600+D PO) Take 1 tablet by mouth daily.   hydrochlorothiazide (HYDRODIURIL) 12.5 MG tablet TAKE 1 TABLET EVERY DAY   simvastatin (ZOCOR) 20 MG tablet TAKE 1 TABLET EVERY EVENING   No facility-administered encounter medications on file as of 01/03/2023.    Allergies (verified) Patient has no known allergies.   History: Past Medical History:  Diagnosis Date   Arthritis    arthritis in hips    Candida infection of flexural skin 11/09/2021   Fracture of ankle,  left, closed 10/1998   Gastritis 07/2006   by endoscopy   Healthcare maintenance 09/10/2021   HEMORRHOIDS, NOS 09/29/2006   Qualifier: Diagnosis of  By: Sheffield Slider MD, [facility]     Hypertension    Subungual hematoma of right foot 02/18/2015   Right great toe    Past Surgical History:  Procedure Laterality Date   ABDOMINAL HYSTERECTOMY     APPENDECTOMY     BACK SURGERY     BLADDER SUSPENSION  01/2004   CHOLECYSTECTOMY  1987   CRYOABLATION     bridge of nose, Dr Yetta Barre   EXCISION/RELEASE BURSA HIP  09/22/2011   Procedure: EXCISION/RELEASE BURSA HIP;  Surgeon: Loanne Drilling, MD;  Location: [facility];  Service: Orthopedics;  Laterality: Right;  right hip bursectomy    LUMBAR DISC SURGERY  [phone]   SPIDER VEIN INJECTION Bilateral 2010   TONSILLECTOMY  1956   TRABECULECTOMY Bilateral    laser   VAGINAL HYSTERECTOMY  1988   benign disease   Family History  Problem Relation Age of Onset   Heart disease Mother 34   Anuerysm Father 60   Heart disease Brother 20       Dementia   Social History   Socioeconomic History   Marital status: Married    Spouse name: Reita Cliche   Number of children: 4   Years of education: 12   Highest education level: High school graduate  Occupational History   Occupation: retired  Tobacco Use   Smoking status: Never   Smokeless tobacco: Never  Vaping Use   Vaping Use: Never used  Substance and Sexual Activity   Alcohol use: No   Drug use: No   Sexual activity: Not Currently  Other Topics Concern   Not on file  Social History Narrative   Patient lives with husband in [place], their daughter lives down the street from them.    Patient enjoys spending time with family, going to church, and going to the beach.    Patient walks everyday for ~ 10 minutes.            Social Determinants of Health   Financial Resource Strain: Low Risk  (01/03/2023)   Overall Financial Resource Strain (CARDIA)    Difficulty of Paying Living Expenses: Not hard at  all  Food Insecurity: No Food Insecurity (01/03/2023)   Hunger Vital Sign    Worried About Running Out of Food in the Last Year: Never true    Ran Out of Food in the Last Year: Never true  Transportation Needs: No Transportation Needs (01/03/2023)   PRAPARE - Administrator, Civil Service (Medical): No    Lack of Transportation (Non-Medical): No  Physical Activity: Insufficiently Active (01/03/2023)   Exercise Vital Sign    Days of Exercise per Week: 7 days    Minutes of Exercise per Session: 10 min  Stress: No Stress Concern Present (01/03/2023)   [employer] of Occupational Health - Occupational Stress Questionnaire    Feeling of Stress : Not at all  Social Connections: Moderately Integrated (01/03/2023)   Social Connection and Isolation Panel [NHANES]    Frequency of Communication with Friends and Family: More than three times a week    Frequency of Social Gatherings with Friends and Family: Three times a week    Attends Religious Services: More than 4 times per year    Active Member of Clubs or Organizations: No    Attends Banker Meetings: Never    Marital Status: Married    Tobacco Counseling Counseling given: Not Answered   Clinical Intake:  Pre-visit preparation completed: Yes  Pain : No/denies pain     Diabetes: No  How often do you need to have someone help you when you read instructions, pamphlets, or other written materials from your doctor or pharmacy?: 2 - Rarely  Diabetic?No   Interpreter Needed?: No  Information entered by :: Kandis Fantasia LPN   Activities of Daily Living    01/03/2023   11:28 AM  In your present state of health, do you have any difficulty performing the following activities:  Hearing? 0  Vision? 0  Difficulty concentrating or making decisions? 0  Walking or climbing stairs? 0  Dressing or bathing? 0  Doing errands, shopping? 0  Preparing Food and eating ? N  Using the Toilet? N  In the past six months,  have you accidently leaked urine? N  Do you have problems with loss of bowel control? N  Managing your Medications? N  Managing your Finances? N  Housekeeping or managing your Housekeeping? N    Patient Care Team: Latrelle Dodrill, MD as PCP - General (Family Medicine) Ernesto Rutherford, MD (Ophthalmology) Kieth Brightly, DPM as Referring Physician (Podiatry)  Indicate any recent Medical Services you may have received from other than [facility] providers in the past year (date may be approximate).     Assessment:   This is a routine wellness examination for  Dashanna.  Hearing/Vision screen Hearing Screening - Comments:: Denies hearing difficulties   Vision Screening - Comments:: up to date with routine eye exams with Dr. Dione Booze    Dietary issues and exercise activities discussed: Current Exercise Habits: Home exercise routine, Type of exercise: walking, Time (Minutes): 10, Frequency (Times/Week): 7, Weekly Exercise (Minutes/Week): 70, Intensity: Mild   Goals Addressed             This Visit's Progress    Remain active and independent        Depression Screen    01/03/2023   11:26 AM 11/23/2022    3:32 PM 06/10/2022   11:19 AM 11/09/2021    2:28 PM 09/10/2021    9:00 AM 09/09/2020    9:38 AM 07/06/2019    9:39 AM  PHQ 2/9 Scores  PHQ - 2 Score 0 0 0 0 0 0 0  PHQ- 9 Score 0 0 1 0 0 0     Fall Risk    01/03/2023   11:28 AM 06/14/2022   12:04 PM 06/10/2022   11:20 AM 09/10/2021    9:00 AM 09/09/2020    9:39 AM  Fall Risk   Falls in the past year? 0 0 0 0 0  Number falls in past yr: 0  0 0 0  Injury with Fall? 0  0 0 0  Risk for fall due to : No Fall Risks    No Fall Risks  Follow up Falls prevention discussed;Education provided;Falls evaluation completed  Falls evaluation completed      FALL RISK PREVENTION PERTAINING TO THE HOME:  Any stairs in or around the home? No  If so, are there any without handrails? No  Home free of loose throw rugs in walkways, pet beds,  electrical cords, etc? Yes  Adequate lighting in your home to reduce risk of falls? Yes   ASSISTIVE DEVICES UTILIZED TO PREVENT FALLS:  Life alert? No  Use of a cane, walker or w/c? No  Grab bars in the bathroom? Yes  Shower chair or bench in shower? No  Elevated toilet seat or a handicapped toilet? Yes   TIMED UP AND GO:  Was the test performed? No . Telephonic visit   Cognitive Function:    11/22/2013   12:00 PM 12/21/2011    3:00 PM 12/10/2010    4:00 PM  MMSE - Mini Mental State Exam  Orientation to time 5 5 5   Orientation to Place 5 5 5   Registration 3 3 3   Attention/ Calculation 5 5 5   Recall 3 3 3   Language- name 2 objects 2 2 2   Language- repeat 1 1 1   Language- follow 3 step command 3 3 3   Language- read & follow direction 1 1 1   Write a sentence 1 1 1   Copy design 1 1 1   Total score 30 30 30         01/03/2023   11:28 AM 07/06/2019    9:41 AM  6CIT Screen  What Year? 0 points 0 points  What month? 0 points 0 points  What time? 0 points 0 points  Count back from 20 0 points 0 points  Months in reverse 0 points 2 points  Repeat phrase 2 points 2 points  Total Score 2 points 4 points    Immunizations Immunization History  Administered Date(s) Administered   Influenza Whole 04/26/2008, 05/13/2009, 05/05/2010   Influenza-Unspecified 06/01/2013, 04/02/2016, 05/19/2017, 04/19/2019, 04/16/2020, 04/28/2021, 05/27/2022   Moderna Covid-19 Vaccine Bivalent Booster 30yrs &  up 05/22/2021   Moderna SARS-COV2 Booster Vaccination 05/20/2022   PFIZER Comirnaty(Gray Top)Covid-19 Tri-Sucrose Vaccine 11/05/2020   PFIZER(Purple Top)SARS-COV-2 Vaccination 08/27/2019, 09/17/2019, 05/15/2020   Pfizer Covid-19 Vaccine Bivalent Booster 46yrs & up 05/22/2021   Pneumococcal Conjugate-13 06/04/2014   Pneumococcal Polysaccharide-23 07/08/1998   Td 04/02/1997, 06/13/2008   Tdap 08/08/2018   Zoster, Live 07/02/2010    TDAP status: Up to date  Pneumococcal vaccine status: Up to  date  Covid-19 vaccine status: Information provided on how to obtain vaccines.   Qualifies for Shingles Vaccine? Yes   Zostavax completed Yes   Shingrix Completed?: No.    Education has been provided regarding the importance of this vaccine. Patient has been advised to call insurance company to determine out of pocket expense if they have not yet received this vaccine. Advised may also receive vaccine at local pharmacy or Health Dept. Verbalized acceptance and understanding.  Screening Tests Health Maintenance  Topic Date Due   Zoster Vaccines- Shingrix (1 of 2) Never done   COVID-19 Vaccine (8 - 2023-24 season) 07/15/2022   INFLUENZA VACCINE  03/03/2023   Medicare Annual Wellness (AWV)  01/03/2024   DTaP/Tdap/Td (4 - Td or Tdap) 08/08/2028   Pneumonia Vaccine 25+ Years old  Completed   DEXA SCAN  Completed   HPV VACCINES  Aged Out    Health Maintenance  Health Maintenance Due  Topic Date Due   Zoster Vaccines- Shingrix (1 of 2) Never done   COVID-19 Vaccine (8 - 2023-24 season) 07/15/2022    Colorectal cancer screening: No longer required.   Mammogram status: No longer required due to age and preference.  Bone Density status: Ordered and scheduled for 02/07/23. Pt provided with contact info and advised to call to schedule appt.  Lung Cancer Screening: (Low Dose CT Chest recommended if Age 66-80 years, 30 pack-year currently smoking OR have quit w/in 15years.) does not qualify.   Lung Cancer Screening Referral: n/a  Additional Screening:  Hepatitis C Screening: does not qualify  Vision Screening: Recommended annual ophthalmology exams for early detection of glaucoma and other disorders of the eye. Is the patient up to date with their annual eye exam?  Yes  Who is the provider or what is the name of the office in which the patient attends annual eye exams? Dr. Dione Booze  If pt is not established with a provider, would they like to be referred to a provider to establish care? No  .   Dental Screening: Recommended annual dental exams for proper oral hygiene  Community Resource Referral / Chronic Care Management: CRR required this visit?  No   CCM required this visit?  No      Plan:     I have personally reviewed and noted the following in the patient's chart:   Medical and social history Use of alcohol, tobacco or illicit drugs  Current medications and supplements including opioid prescriptions. Patient is not currently taking opioid prescriptions. Functional ability and status Nutritional status Physical activity Advanced directives List of other physicians Hospitalizations, surgeries, and ER visits in previous 12 months Vitals Screenings to include cognitive, depression, and falls Referrals and appointments  In addition, I have reviewed and discussed with patient certain preventive protocols, quality metrics, and best practice recommendations. A written personalized care plan for preventive services as well as general preventive health recommendations were provided to patient.     Durwin Nora, [place]   08/07/1094   Due to this being a virtual visit, the after visit  summary with patients personalized plan was offered to patient via mail or my-chart. per request, patient was mailed a copy of AVS  Nurse Notes: No concerns

## 2023-01-03 NOTE — Patient Instructions (Addendum)
Amanda Shaw , Thank you for taking time to come for your Medicare Wellness Visit. I appreciate your ongoing commitment to your health goals. Please review the following plan we discussed and let me know if I can assist you in the future.   These are the goals we discussed:  Goals      Exercise 3x per week (30 min per time)     Patient walking ~ 10 minutes a time. Try and do 15 minutes in AM and PM to reach 30 minutes per day!     Remain active and independent        This is a list of the screening recommended for you and due dates:  Health Maintenance  Topic Date Due   Zoster (Shingles) Vaccine (1 of 2) Never done   COVID-19 Vaccine (8 - 2023-24 season) 07/15/2022   Flu Shot  03/03/2023   Medicare Annual Wellness Visit  01/03/2024   DTaP/Tdap/Td vaccine (4 - Td or Tdap) 08/08/2028   Pneumonia Vaccine  Completed   DEXA scan (bone density measurement)  Completed   HPV Vaccine  Aged Out    Advanced directives: Information on Advanced Care Planning can be found at [facility] Advance Health Care Directives Advance Health Care Directives ([email or web address]  Please bring a copy of your health care power of attorney and living will to the office to be added to your chart at your convenience.   Conditions/risks identified: Aim for 30 minutes of exercise or brisk walking, 6-8 glasses of water, and 5 servings of fruits and vegetables each day.  Next appointment: Follow up in one year for your annual wellness visit   Preventive Care 65 Years and Older, Female Preventive care refers to lifestyle choices and visits with your health care provider that can promote health and wellness. What does preventive care include? A yearly physical exam. This is also called an annual well check. Dental exams once or twice a year. Routine eye exams. Ask your health care provider how often you should have your eyes checked. Personal lifestyle choices, including: Daily care of your teeth  and gums. Regular physical activity. Eating a healthy diet. Avoiding tobacco and drug use. Limiting alcohol use. Practicing safe sex. Taking low-dose aspirin every day. Taking vitamin and mineral supplements as recommended by your health care provider. What happens during an annual well check? The services and screenings done by your health care provider during your annual well check will depend on your age, overall health, lifestyle risk factors, and family history of disease. Counseling  Your health care provider may ask you questions about your: Alcohol use. Tobacco use. Drug use. Emotional well-being. Home and relationship well-being. Sexual activity. Eating habits. History of falls. Memory and ability to understand (cognition). Work and work Astronomer. Reproductive health. Screening  You may have the following tests or measurements: Height, weight, and BMI. Blood pressure. Lipid and cholesterol levels. These may be checked every 5 years, or more frequently if you are over 12 years old. Skin check. Lung cancer screening. You may have this screening every year starting at age 48 if you have a 30-pack-year history of smoking and currently smoke or have quit within the past 15 years. Fecal occult blood test (FOBT) of the stool. You may have this test every year starting at age 52. Flexible sigmoidoscopy or colonoscopy. You may have a sigmoidoscopy every 5 years or a colonoscopy every 10 years starting at age 67. Hepatitis C blood test. Hepatitis B  blood test. Sexually transmitted disease (STD) testing. Diabetes screening. This is done by checking your blood sugar (glucose) after you have not eaten for a while (fasting). You may have this done every 1-3 years. Bone density scan. This is done to screen for osteoporosis. You may have this done starting at age 32. Mammogram. This may be done every 1-2 years. Talk to your health care provider about how often you should have regular  mammograms. Talk with your health care provider about your test results, treatment options, and if necessary, the need for more tests. Vaccines  Your health care provider may recommend certain vaccines, such as: Influenza vaccine. This is recommended every year. Tetanus, diphtheria, and acellular pertussis (Tdap, Td) vaccine. You may need a Td booster every 10 years. Zoster vaccine. You may need this after age 38. Pneumococcal 13-valent conjugate (PCV13) vaccine. One dose is recommended after age 1. Pneumococcal polysaccharide (PPSV23) vaccine. One dose is recommended after age 69. Talk to your health care provider about which screenings and vaccines you need and how often you need them. This information is not intended to replace advice given to you by your health care provider. Make sure you discuss any questions you have with your health care provider. Document Released: 08/15/2015 Document Revised: 04/07/2016 Document Reviewed: 05/20/2015 Elsevier Interactive Patient Education  2017 [employer].  Fall Prevention in the Home Falls can cause injuries. They can happen to people of all ages. There are many things you can do to make your home safe and to help prevent falls. What can I do on the outside of my home? Regularly fix the edges of walkways and driveways and fix any cracks. Remove anything that might make you trip as you walk through a door, such as a raised step or threshold. Trim any bushes or trees on the path to your home. Use bright outdoor lighting. Clear any walking paths of anything that might make someone trip, such as rocks or tools. Regularly check to see if handrails are loose or broken. Make sure that both sides of any steps have handrails. Any raised decks and porches should have guardrails on the edges. Have any leaves, snow, or ice cleared regularly. Use sand or salt on walking paths during winter. Clean up any spills in your garage right away. This includes oil  or grease spills. What can I do in the bathroom? Use night lights. Install grab bars by the toilet and in the tub and shower. Do not use towel bars as grab bars. Use non-skid mats or decals in the tub or shower. If you need to sit down in the shower, use a plastic, non-slip stool. Keep the floor dry. Clean up any water that spills on the floor as soon as it happens. Remove soap buildup in the tub or shower regularly. Attach bath mats securely with double-sided non-slip rug tape. Do not have throw rugs and other things on the floor that can make you trip. What can I do in the bedroom? Use night lights. Make sure that you have a light by your bed that is easy to reach. Do not use any sheets or blankets that are too big for your bed. They should not hang down onto the floor. Have a firm chair that has side arms. You can use this for support while you get dressed. Do not have throw rugs and other things on the floor that can make you trip. What can I do in the kitchen? Clean up any spills  right away. Avoid walking on wet floors. Keep items that you use a lot in easy-to-reach places. If you need to reach something above you, use a strong step stool that has a grab bar. Keep electrical cords out of the way. Do not use floor polish or wax that makes floors slippery. If you must use wax, use non-skid floor wax. Do not have throw rugs and other things on the floor that can make you trip. What can I do with my stairs? Do not leave any items on the stairs. Make sure that there are handrails on both sides of the stairs and use them. Fix handrails that are broken or loose. Make sure that handrails are as long as the stairways. Check any carpeting to make sure that it is firmly attached to the stairs. Fix any carpet that is loose or worn. Avoid having throw rugs at the top or bottom of the stairs. If you do have throw rugs, attach them to the floor with carpet tape. Make sure that you have a light  switch at the top of the stairs and the bottom of the stairs. If you do not have them, ask someone to add them for you. What else can I do to help prevent falls? Wear shoes that: Do not have high heels. Have rubber bottoms. Are comfortable and fit you well. Are closed at the toe. Do not wear sandals. If you use a stepladder: Make sure that it is fully opened. Do not climb a closed stepladder. Make sure that both sides of the stepladder are locked into place. Ask someone to hold it for you, if possible. Clearly mark and make sure that you can see: Any grab bars or handrails. First and last steps. Where the edge of each step is. Use tools that help you move around (mobility aids) if they are needed. These include: Canes. Walkers. Scooters. Crutches. Turn on the lights when you go into a dark area. Replace any light bulbs as soon as they burn out. Set up your furniture so you have a clear path. Avoid moving your furniture around. If any of your floors are uneven, fix them. If there are any pets around you, be aware of where they are. Review your medicines with your doctor. Some medicines can make you feel dizzy. This can increase your chance of falling. Ask your doctor what other things that you can do to help prevent falls. This information is not intended to replace advice given to you by your health care provider. Make sure you discuss any questions you have with your health care provider. Document Released: 05/15/2009 Document Revised: 12/25/2015 Document Reviewed: 08/23/2014 Elsevier Interactive Patient Education  2017 [employer].

## 2023-01-11 ENCOUNTER — Other Ambulatory Visit: Payer: Self-pay | Admitting: Family Medicine

## 2023-01-11 DIAGNOSIS — M810 Age-related osteoporosis without current pathological fracture: Secondary | ICD-10-CM

## 2023-01-15 ENCOUNTER — Other Ambulatory Visit: Payer: Self-pay | Admitting: Family Medicine

## 2023-01-18 NOTE — Telephone Encounter (Signed)
Please let patient know I am refilling this medication, but she needs to schedule an appointment with me. She is due for labwork for her kidneys.  Thanks, Latrelle Dodrill, MD

## 2023-01-19 NOTE — Telephone Encounter (Signed)
Called and lvm for patient to call back and schedule appointment. Also let her know medication was refilled.

## 2023-01-27 DIAGNOSIS — H1045 Other chronic allergic conjunctivitis: Secondary | ICD-10-CM | POA: Diagnosis not present

## 2023-01-27 DIAGNOSIS — H02831 Dermatochalasis of right upper eyelid: Secondary | ICD-10-CM | POA: Diagnosis not present

## 2023-01-27 DIAGNOSIS — H40053 Ocular hypertension, bilateral: Secondary | ICD-10-CM | POA: Diagnosis not present

## 2023-01-27 DIAGNOSIS — H04123 Dry eye syndrome of bilateral lacrimal glands: Secondary | ICD-10-CM | POA: Diagnosis not present

## 2023-01-27 DIAGNOSIS — H02834 Dermatochalasis of left upper eyelid: Secondary | ICD-10-CM | POA: Diagnosis not present

## 2023-01-27 DIAGNOSIS — H04202 Unspecified epiphora, left lacrimal gland: Secondary | ICD-10-CM | POA: Diagnosis not present

## 2023-01-27 DIAGNOSIS — H2513 Age-related nuclear cataract, bilateral: Secondary | ICD-10-CM | POA: Diagnosis not present

## 2023-02-07 ENCOUNTER — Other Ambulatory Visit: Payer: Medicare HMO

## 2023-03-04 DIAGNOSIS — L84 Corns and callosities: Secondary | ICD-10-CM | POA: Diagnosis not present

## 2023-03-04 DIAGNOSIS — B351 Tinea unguium: Secondary | ICD-10-CM | POA: Diagnosis not present

## 2023-03-04 DIAGNOSIS — I739 Peripheral vascular disease, unspecified: Secondary | ICD-10-CM | POA: Diagnosis not present

## 2023-03-04 DIAGNOSIS — L603 Nail dystrophy: Secondary | ICD-10-CM | POA: Diagnosis not present

## 2023-04-01 ENCOUNTER — Other Ambulatory Visit: Payer: Self-pay | Admitting: Family Medicine

## 2023-04-05 NOTE — Telephone Encounter (Signed)
Please let patient know I am refilling this medication, but she needs to schedule an appointment with me.   Thanks, Leeanne Rio, MD

## 2023-04-05 NOTE — Telephone Encounter (Signed)
Called patient and informed her medication was filled and schedule future appointment.   Thanks [employer]

## 2023-04-06 DIAGNOSIS — L821 Other seborrheic keratosis: Secondary | ICD-10-CM | POA: Diagnosis not present

## 2023-04-06 DIAGNOSIS — C44212 Basal cell carcinoma of skin of right ear and external auricular canal: Secondary | ICD-10-CM | POA: Diagnosis not present

## 2023-04-06 DIAGNOSIS — Z85828 Personal history of other malignant neoplasm of skin: Secondary | ICD-10-CM | POA: Diagnosis not present

## 2023-04-06 DIAGNOSIS — L570 Actinic keratosis: Secondary | ICD-10-CM | POA: Diagnosis not present

## 2023-04-06 DIAGNOSIS — D485 Neoplasm of uncertain behavior of skin: Secondary | ICD-10-CM | POA: Diagnosis not present

## 2023-04-09 DIAGNOSIS — W57XXXA Bitten or stung by nonvenomous insect and other nonvenomous arthropods, initial encounter: Secondary | ICD-10-CM | POA: Diagnosis not present

## 2023-04-09 DIAGNOSIS — S50861A Insect bite (nonvenomous) of right forearm, initial encounter: Secondary | ICD-10-CM | POA: Diagnosis not present

## 2023-04-28 ENCOUNTER — Encounter: Payer: Self-pay | Admitting: Family Medicine

## 2023-04-28 ENCOUNTER — Other Ambulatory Visit: Payer: Self-pay

## 2023-04-28 ENCOUNTER — Ambulatory Visit (INDEPENDENT_AMBULATORY_CARE_PROVIDER_SITE_OTHER): Payer: Medicare HMO | Admitting: Family Medicine

## 2023-04-28 VITALS — BP 148/76 | HR 74 | Ht 61.0 in | Wt 128.6 lb

## 2023-04-28 DIAGNOSIS — Z0000 Encounter for general adult medical examination without abnormal findings: Secondary | ICD-10-CM | POA: Diagnosis not present

## 2023-04-28 DIAGNOSIS — E785 Hyperlipidemia, unspecified: Secondary | ICD-10-CM

## 2023-04-28 DIAGNOSIS — E0590 Thyrotoxicosis, unspecified without thyrotoxic crisis or storm: Secondary | ICD-10-CM

## 2023-04-28 DIAGNOSIS — M810 Age-related osteoporosis without current pathological fracture: Secondary | ICD-10-CM

## 2023-04-28 DIAGNOSIS — I10 Essential (primary) hypertension: Secondary | ICD-10-CM

## 2023-04-28 MED ORDER — SHINGRIX 50 MCG/0.5ML IM SUSR
INTRAMUSCULAR | 1 refills | Status: DC
Start: 2023-04-28 — End: 2023-11-12

## 2023-04-28 NOTE — Assessment & Plan Note (Addendum)
Elevated today, improved on recheck though still mildly elevated.  Suspect some degree of whitecoat hypertension due to stress while traveling here.  Home readings are good.  We will not make changes to her medications today. Continue hydrochlorothiazide 12.5mg  daily.

## 2023-04-28 NOTE — Patient Instructions (Signed)
It was great to see you again today.  Checking labs today Follow-up with me in 6 months, sooner if needed  Be well, Dr. Pollie Meyer

## 2023-04-28 NOTE — Assessment & Plan Note (Signed)
Update fasting lipids today.  Continue statin.

## 2023-04-28 NOTE — Assessment & Plan Note (Signed)
Declines flu shot, plans to get it next month Declines COVID booster Discussed Shingrix, she is amenable to getting a prescription and will decide if she wants to get it.

## 2023-04-28 NOTE — Progress Notes (Signed)
  Date of Visit: 04/28/2023   SUBJECTIVE:   HPI:  Akailah presents today for routine follow-up.  Hyperlipidemia: Currently taking simvastatin 20 mg daily.  Tolerating this well.  Hypertension: Currently taking HCTZ 12.5 mg daily.  Home readings are typically systolics in the 130s.  Osteoporosis: Has DEXA scan scheduled on December 17.  Is on bisphosphonate holiday.  Taking calcium and vitamin D.  Subclinical hyperthyroidism: Not on any specific medication therapies.  Agreeable to getting thyroid labs today for monitoring.  Has noticed a bump on the right side of her neck that is intermittently present.  It is not present today.  Only new concern for her today is that she has noticed that her mouth trembles sometimes.  It is just occasionally happening.  Has been going on ever since her husband died earlier this year of metastatic prostate cancer.  She is coping well with that loss overall.  Her daughter has moved in with her.  The mouth trembling does not bother her much, she just has noticed it.  Eating and drinking well, no difficulty with swallowing.   OBJECTIVE:   BP (!) 148/76   Pulse 74   Ht 5\' 1"  (1.549 m)   Wt 128 lb 9.6 oz (58.3 kg)   SpO2 99%   BMI 24.30 kg/m  Gen: no acute distress, pleasant, cooperative HEENT: Normocephalic, atraumatic, no masses palpable in neck, no lymphadenopathy.  Carotid palpable bilaterally and feels normal. Heart: Regular rate and rhythm, no murmur Lungs: Clear to auscultation bilaterally, normal effort Neuro: Grossly nonfocal, speech normal.  Occasional mild trembling of lower mandible noted. Ext: No edema  ASSESSMENT/PLAN:   Assessment & Plan HYPERTENSION, BENIGN SYSTEMIC Elevated today, improved on recheck though still mildly elevated.  Suspect some degree of whitecoat hypertension due to stress while traveling here.  Home readings are good.  We will not make changes to her medications today. Continue hydrochlorothiazide 12.5mg   daily. Hyperlipidemia, unspecified hyperlipidemia type Update fasting lipids today.  Continue statin. Subclinical hyperthyroidism Update TSH, T3, free T4 today Routine health maintenance Declines flu shot, plans to get it next month Declines COVID booster Discussed Shingrix, she is amenable to getting a prescription and will decide if she wants to get it. Osteoporosis, unspecified osteoporosis type, unspecified pathological fracture presence Continue calcium and vitamin D DEXA scan scheduled in December    FOLLOW UP: Follow up in 6 months for above issues  [place] J. Pollie Meyer, MD [facility]

## 2023-04-28 NOTE — Assessment & Plan Note (Signed)
Update TSH, T3, free T4 today

## 2023-04-28 NOTE — Assessment & Plan Note (Signed)
Continue calcium and vitamin D DEXA scan scheduled in December

## 2023-04-29 LAB — BASIC METABOLIC PANEL
BUN/Creatinine Ratio: 32 — ABNORMAL HIGH (ref 12–28)
BUN: 30 mg/dL (ref 10–36)
CO2: 26 mmol/L (ref 20–29)
Calcium: 10.1 mg/dL (ref 8.7–10.3)
Chloride: 97 mmol/L (ref 96–106)
Creatinine, Ser: 0.93 mg/dL (ref 0.57–1.00)
Glucose: 93 mg/dL (ref 70–99)
Potassium: 4 mmol/L (ref 3.5–5.2)
Sodium: 139 mmol/L (ref 134–144)
eGFR: 58 mL/min/{1.73_m2} — ABNORMAL LOW (ref >59)

## 2023-04-29 LAB — TSH: TSH: 0.209 u[IU]/mL — ABNORMAL LOW (ref 0.450–4.500)

## 2023-04-29 LAB — LIPID PANEL
Chol/HDL Ratio: 2.1 {ratio} (ref 0.0–4.4)
Cholesterol, Total: 169 mg/dL (ref 100–199)
HDL: 81 mg/dL (ref >39)
LDL Chol Calc (NIH): 73 mg/dL (ref 0–99)
Triglycerides: 82 mg/dL (ref 0–149)
VLDL Cholesterol Cal: 15 mg/dL (ref 5–40)

## 2023-04-29 LAB — T3: T3, Total: 128 ng/dL (ref 71–180)

## 2023-04-29 LAB — T4, FREE: Free T4: 1.2 ng/dL (ref 0.82–1.77)

## 2023-05-03 ENCOUNTER — Encounter: Payer: Self-pay | Admitting: Family Medicine

## 2023-05-10 ENCOUNTER — Other Ambulatory Visit (HOSPITAL_COMMUNITY): Payer: Self-pay

## 2023-05-10 DIAGNOSIS — C44212 Basal cell carcinoma of skin of right ear and external auricular canal: Secondary | ICD-10-CM | POA: Diagnosis not present

## 2023-05-10 DIAGNOSIS — Z85828 Personal history of other malignant neoplasm of skin: Secondary | ICD-10-CM | POA: Diagnosis not present

## 2023-05-10 MED ORDER — DOXYCYCLINE HYCLATE 100 MG PO CAPS
100.0000 mg | ORAL_CAPSULE | Freq: Two times a day (BID) | ORAL | 0 refills | Status: AC
  Filled 2023-05-10: qty 10, 5d supply, fill #0

## 2023-05-12 ENCOUNTER — Other Ambulatory Visit: Payer: Self-pay | Admitting: Family Medicine

## 2023-05-30 DIAGNOSIS — L603 Nail dystrophy: Secondary | ICD-10-CM | POA: Diagnosis not present

## 2023-05-30 DIAGNOSIS — L84 Corns and callosities: Secondary | ICD-10-CM | POA: Diagnosis not present

## 2023-05-30 DIAGNOSIS — B351 Tinea unguium: Secondary | ICD-10-CM | POA: Diagnosis not present

## 2023-05-30 DIAGNOSIS — I739 Peripheral vascular disease, unspecified: Secondary | ICD-10-CM | POA: Diagnosis not present

## 2023-06-17 ENCOUNTER — Other Ambulatory Visit: Payer: Self-pay | Admitting: Family Medicine

## 2023-07-19 ENCOUNTER — Ambulatory Visit
Admission: RE | Admit: 2023-07-19 | Discharge: 2023-07-19 | Disposition: A | Discharge status: Home / Self Care | Payer: Medicare HMO | Source: Ambulatory Visit | Attending: Family Medicine | Admitting: Family Medicine

## 2023-07-19 DIAGNOSIS — E2839 Other primary ovarian failure: Secondary | ICD-10-CM | POA: Diagnosis not present

## 2023-07-19 DIAGNOSIS — M8588 Other specified disorders of bone density and structure, other site: Secondary | ICD-10-CM | POA: Diagnosis not present

## 2023-07-19 DIAGNOSIS — N958 Other specified menopausal and perimenopausal disorders: Secondary | ICD-10-CM | POA: Diagnosis not present

## 2023-07-19 DIAGNOSIS — M810 Age-related osteoporosis without current pathological fracture: Secondary | ICD-10-CM

## 2023-08-04 DIAGNOSIS — H1045 Other chronic allergic conjunctivitis: Secondary | ICD-10-CM | POA: Diagnosis not present

## 2023-08-04 DIAGNOSIS — H40053 Ocular hypertension, bilateral: Secondary | ICD-10-CM | POA: Diagnosis not present

## 2023-08-04 DIAGNOSIS — H2513 Age-related nuclear cataract, bilateral: Secondary | ICD-10-CM | POA: Diagnosis not present

## 2023-08-04 DIAGNOSIS — H04123 Dry eye syndrome of bilateral lacrimal glands: Secondary | ICD-10-CM | POA: Diagnosis not present

## 2023-10-26 DIAGNOSIS — L821 Other seborrheic keratosis: Secondary | ICD-10-CM | POA: Diagnosis not present

## 2023-10-26 DIAGNOSIS — Z85828 Personal history of other malignant neoplasm of skin: Secondary | ICD-10-CM | POA: Diagnosis not present

## 2023-10-26 DIAGNOSIS — L570 Actinic keratosis: Secondary | ICD-10-CM | POA: Diagnosis not present

## 2023-11-07 ENCOUNTER — Ambulatory Visit (INDEPENDENT_AMBULATORY_CARE_PROVIDER_SITE_OTHER): Admitting: Family Medicine

## 2023-11-07 ENCOUNTER — Encounter: Payer: Self-pay | Admitting: Family Medicine

## 2023-11-07 VITALS — BP 175/74 | HR 85 | Wt 128.8 lb

## 2023-11-07 DIAGNOSIS — R4189 Other symptoms and signs involving cognitive functions and awareness: Secondary | ICD-10-CM | POA: Diagnosis not present

## 2023-11-07 DIAGNOSIS — R350 Frequency of micturition: Secondary | ICD-10-CM

## 2023-11-07 NOTE — Patient Instructions (Addendum)
 It was great to see you today! Thank you for choosing [facility] for your primary care. Amanda Shaw was seen for memory impairment.  Today we addressed: Today we are testing for reversible causes of dementia-folate, B12, TSH, RPR B1 along with blood and electrolyte counts Testing urine due to urinary frequency Your MMSE was within normal limits  Cognitive decline may take multiple office visits to fully address.  Call the clinic at [phone] if your symptoms worsen or you have any concerns.  You should return to our clinic Return in about 1 month (around 12/07/2023) for memory. Please arrive 15 minutes before your appointment to ensure smooth check in process.  We appreciate your efforts in making this happen.  Thank you for allowing me to participate in your care, Para March, DO 11/07/2023, 2:37 PM PGY-1, [facility]

## 2023-11-07 NOTE — Progress Notes (Signed)
    SUBJECTIVE:   CHIEF COMPLAINT / HPI:   Presents with daughter, son, daughter-in-law with concern for cognitive decline over the last few months Patient's husband passed away 1 year ago and her daughter moved in with her after that so she has undergone a lot of life change Family states the patient has become difficult to follow during conversations, has not been leaving the house as much she used to, forgetful, and sometimes agitated and aggressive with daughter who she lives with Mimicking behaviors as well-daughter frequently has to urinate so now patient frequently has to urinate.  Family unsure if this is patient trying to mimic her daughter or if she actually has urinary frequency  Patient does not feel like her memory has changed.  She still takes care of her own medications but her daughter drives her around Patient's family would like lab work to assess for any reversible causes of memory decline and urinalysis given urinary frequency  PERTINENT  PMH / PSH: Hypertension, venous insufficiency, subclinical hyperthyroidism, osteoporosis, hyperlipidemia, glaucoma  OBJECTIVE:   BP (!) 175/74   Pulse 85   Wt 128 lb 12.8 oz (58.4 kg)   SpO2 100%   BMI 24.34 kg/m   GEN: Well-appearing, no acute distress. Neuro: Ambulates independently without assistance.  Alert and oriented x 4.  No focal neurological deficit.  MMSE exam completed which she scored 27/30. She is a Engineer, agricultural.  Skin: Vertical ridges on nails  ASSESSMENT/PLAN:   Assessment & Plan Cognitive impairment Appears this has been going on since husband passed away but worsening over the last 6 months.  MMSE 27 out of 30 so she did not show any signs of cognitive impairment with that, however history given by family is concerning for impairment.  I suspect this is age-related decline.  Consider depression, patient was unable to fill out PHQ-9 today due to too much going on but plan to do that at next appointment -  Obtain lab work for reversible causes of dementia: Vitamin B12, TSH, RPR, thiamine, folate, CBC, BMP, iron - Family aware that workup may require multiple appointments Urinary frequency Unclear timeline, patient states she is asymptomatic but unsure.  Unable to obtain urine today, family is to obtain it at home and bring it back to [place].  If unable to we will collect this at her next visit   Unable to complete BP recheck as pt was in bathroom for a long time  Para March, DO [facility]

## 2023-11-10 ENCOUNTER — Other Ambulatory Visit: Payer: Self-pay | Admitting: Family Medicine

## 2023-11-10 ENCOUNTER — Encounter: Payer: Self-pay | Admitting: Family Medicine

## 2023-11-10 ENCOUNTER — Other Ambulatory Visit

## 2023-11-10 ENCOUNTER — Telehealth: Payer: Self-pay

## 2023-11-10 ENCOUNTER — Ambulatory Visit: Admitting: Family Medicine

## 2023-11-10 VITALS — BP 178/75 | HR 80 | Ht 61.0 in | Wt 128.4 lb

## 2023-11-10 DIAGNOSIS — R4182 Altered mental status, unspecified: Secondary | ICD-10-CM

## 2023-11-10 DIAGNOSIS — R350 Frequency of micturition: Secondary | ICD-10-CM

## 2023-11-10 LAB — POCT URINALYSIS DIP (MANUAL ENTRY)
Bilirubin, UA: NEGATIVE
Glucose, UA: NEGATIVE mg/dL
Ketones, POC UA: NEGATIVE mg/dL
Leukocytes, UA: NEGATIVE
Nitrite, UA: NEGATIVE
Protein Ur, POC: NEGATIVE mg/dL
Spec Grav, UA: 1.015 (ref 1.010–1.025)
Urobilinogen, UA: 0.2 U/dL
pH, UA: 5.5 (ref 5.0–8.0)

## 2023-11-10 LAB — POCT UA - MICROSCOPIC ONLY

## 2023-11-10 NOTE — Telephone Encounter (Signed)
 Patient's daughter calls nurse line regarding concerns since last office visit on Monday, 11/07/23.  She reports that since this last visit, patient has been experiencing episodes of agitation, shaking and slapping her self. She has also had some episodes of crying.   Denies facial drooping, speech changes/difficulties, arm weakness, balance/gait issues.   Patient was scheduled for 11/14/23, however, daughter is asking if patient can be reevaluated today.   Scheduled same day appointment with Dr. Rexene Alberts this afternoon.   ED precautions discussed in the meantime.   Veronda Prude, RN

## 2023-11-10 NOTE — Patient Instructions (Signed)
 Good to see you today - Thank you for coming in  Things we discussed today: I have ordered the CT of your head I will check on what home health resources we have Please follow up with your PCP Dr. Pollie Meyer

## 2023-11-10 NOTE — Progress Notes (Signed)
 Patient, her son, her daughter, and daughter-in-law all present for this appointment and for her last appointment with me 11/07/23  Daughter Myrene Buddy predominately speaking at both appointments.  At the last visit, she was coming up to my desk while I was dictating on another patient's chart and attempting to speak more about [place] and was not walking away without multiple prompts.  At this visit, she was attempting to demonstrate [place] hitting herself in the face at home when she (daughter) grabbed my arm with both of her hands and raised my arm up in the air without permission.  I did have a productive conversation with Maudine when I asked her family to step outside of the room, she was completely alert and oriented and can tell me what medication she was taking and discuss having a DEXA scan a few months ago.  When the family came back in the room, daughter states "do you agree with me that there is something wrong with her" and requests head imaging.  Also asks if I have seen her urine results yet, and told me that I could speak to Molly Maduro about the urine results.  Visit today took 45 minutes.  Our last visit took 45 minutes to an hour, patient spent the majority of that time in the bathroom with her daughter standing right outside the door and going in and out, leaving the door open and anyone in the hallway could see inside.  May need to limit number of visitors that come to appointments with patient in the future.  I do not think an APS report is necessary at this time, patient denies any abuse at home when I talked to her one-on-one.

## 2023-11-10 NOTE — Progress Notes (Signed)
    SUBJECTIVE:   CHIEF COMPLAINT / HPI:   Per chart review: Patient's daughter calls nurse line regarding concerns since last office visit on Monday, 11/07/23.   She reports that since this last visit, patient has been experiencing episodes of agitation, shaking and slapping her self. She has also had some episodes of crying.    Denies facial drooping, speech changes/difficulties, arm weakness, balance/gait issues.       Daughter states that patient has been very tearful, crying frequently, and began hitting herself in the face today while watching TV.  She wanted to bring patient in to be seen due to this behavior today.  When speaking to patient privately, she states that she cries and hits herself because she is frustrated with her daughter.  States that her daughter will be in a bad mood all day long and not listen to her.  Patient denies financial or physical abuse.  States that she is well fed, manages her own medications.  She states that she does not need any medication or anything for depression, she is not depressed she is just frustrated with her daughter.  States that daughter is going to be living with her indefinitely. She is able to verbalize to me that she had a DEXA scan a few months ago and would like Dr. Pollie Meyer to call her with the results.  Daughter would like me to look in patient's ears because she states patient has hearing loss.  Patient denies hearing loss.  PERTINENT  PMH / PSH: HTN, subclinical hyperthyroidism, osteoporosis, HLD  OBJECTIVE:   BP (!) 178/75   Pulse 80   Ht 5\' 1"  (1.549 m)   Wt 128 lb 6.4 oz (58.2 kg)   SpO2 99%   BMI 24.26 kg/m   GEN: Well-appearing, no acute distress HEENT: TMs clear bilaterally.  Landmarks visualized.  No cerumen impaction.  Patient is able to hear and understand every word that I'm saying in a normal tone to voice. Neuro: Awake and alert x 4.  No focal neurological deficits.  Ambulates independently without  difficulty.  ASSESSMENT/PLAN:   Assessment & Plan Altered mental status, unspecified altered mental status type Patient is again not altered on my examination.  No focal neurological deficits, no change from my visit with her 2 days ago.  I suspect that this is more of a behavioral issue at home.  Have discussed with daughter, son, daughter-in-law that they would benefit from family counseling regarding recent life changes in the family.  Daughter would like scan of patient's head because something is wrong with her.  They also would like someone to come help them at the house.  Lab work drawn at last visit overall unremarkable, urinalysis negative for infection. - CT head ordered, I do not suspect acute abnormality - It appears any home health aide would have to be out-of-pocket for family, sent them a MyChart message with this information    Para March, DO [facility]

## 2023-11-11 ENCOUNTER — Encounter (HOSPITAL_COMMUNITY): Payer: Self-pay

## 2023-11-11 ENCOUNTER — Observation Stay (HOSPITAL_COMMUNITY)
Admission: EM | Admit: 2023-11-11 | Discharge: 2023-11-12 | Disposition: A | Discharge status: Home / Self Care | Attending: Family Medicine | Admitting: Family Medicine

## 2023-11-11 ENCOUNTER — Emergency Department (HOSPITAL_COMMUNITY)

## 2023-11-11 ENCOUNTER — Ambulatory Visit
Admission: RE | Admit: 2023-11-11 | Discharge: 2023-11-11 | Disposition: A | Discharge status: Home / Self Care | Source: Ambulatory Visit | Attending: Family Medicine | Admitting: Family Medicine

## 2023-11-11 ENCOUNTER — Other Ambulatory Visit: Payer: Self-pay

## 2023-11-11 ENCOUNTER — Observation Stay (HOSPITAL_COMMUNITY)

## 2023-11-11 ENCOUNTER — Telehealth: Payer: Self-pay | Admitting: Family Medicine

## 2023-11-11 DIAGNOSIS — Z9049 Acquired absence of other specified parts of digestive tract: Secondary | ICD-10-CM | POA: Diagnosis not present

## 2023-11-11 DIAGNOSIS — E785 Hyperlipidemia, unspecified: Secondary | ICD-10-CM | POA: Insufficient documentation

## 2023-11-11 DIAGNOSIS — Z85828 Personal history of other malignant neoplasm of skin: Secondary | ICD-10-CM | POA: Insufficient documentation

## 2023-11-11 DIAGNOSIS — R4182 Altered mental status, unspecified: Secondary | ICD-10-CM | POA: Diagnosis not present

## 2023-11-11 DIAGNOSIS — I10 Essential (primary) hypertension: Secondary | ICD-10-CM | POA: Insufficient documentation

## 2023-11-11 DIAGNOSIS — R102 Pelvic and perineal pain: Secondary | ICD-10-CM | POA: Insufficient documentation

## 2023-11-11 DIAGNOSIS — Z79899 Other long term (current) drug therapy: Secondary | ICD-10-CM | POA: Insufficient documentation

## 2023-11-11 DIAGNOSIS — R55 Syncope and collapse: Principal | ICD-10-CM | POA: Diagnosis present

## 2023-11-11 DIAGNOSIS — E0590 Thyrotoxicosis, unspecified without thyrotoxic crisis or storm: Secondary | ICD-10-CM | POA: Diagnosis not present

## 2023-11-11 DIAGNOSIS — R109 Unspecified abdominal pain: Secondary | ICD-10-CM

## 2023-11-11 DIAGNOSIS — R29818 Other symptoms and signs involving the nervous system: Secondary | ICD-10-CM | POA: Insufficient documentation

## 2023-11-11 DIAGNOSIS — F29 Unspecified psychosis not due to a substance or known physiological condition: Secondary | ICD-10-CM | POA: Diagnosis not present

## 2023-11-11 DIAGNOSIS — Z90710 Acquired absence of both cervix and uterus: Secondary | ICD-10-CM | POA: Diagnosis not present

## 2023-11-11 DIAGNOSIS — R410 Disorientation, unspecified: Secondary | ICD-10-CM | POA: Diagnosis not present

## 2023-11-11 DIAGNOSIS — Z7982 Long term (current) use of aspirin: Secondary | ICD-10-CM | POA: Insufficient documentation

## 2023-11-11 DIAGNOSIS — R1030 Lower abdominal pain, unspecified: Secondary | ICD-10-CM | POA: Diagnosis not present

## 2023-11-11 DIAGNOSIS — R251 Tremor, unspecified: Secondary | ICD-10-CM | POA: Insufficient documentation

## 2023-11-11 DIAGNOSIS — R9089 Other abnormal findings on diagnostic imaging of central nervous system: Secondary | ICD-10-CM | POA: Diagnosis not present

## 2023-11-11 DIAGNOSIS — R569 Unspecified convulsions: Secondary | ICD-10-CM

## 2023-11-11 DIAGNOSIS — I6782 Cerebral ischemia: Secondary | ICD-10-CM | POA: Diagnosis not present

## 2023-11-11 LAB — BASIC METABOLIC PANEL WITH GFR
BUN/Creatinine Ratio: 28 (ref 12–28)
BUN: 25 mg/dL (ref 10–36)
CO2: 22 mmol/L (ref 20–29)
Calcium: 10.2 mg/dL (ref 8.7–10.3)
Chloride: 96 mmol/L (ref 96–106)
Creatinine, Ser: 0.89 mg/dL (ref 0.57–1.00)
Glucose: 95 mg/dL (ref 70–99)
Potassium: 4 mmol/L (ref 3.5–5.2)
Sodium: 140 mmol/L (ref 134–144)
eGFR: 61 mL/min/{1.73_m2} (ref >59)

## 2023-11-11 LAB — AMMONIA: Ammonia: 28 umol/L (ref 9–35)

## 2023-11-11 LAB — CBC
Hematocrit: 44.4 % (ref 34.0–46.6)
Hemoglobin: 14.8 g/dL (ref 11.1–15.9)
MCH: 31.5 pg (ref 26.6–33.0)
MCHC: 33.3 g/dL (ref 31.5–35.7)
MCV: 95 fL (ref 79–97)
Platelets: 230 10*3/uL (ref 150–450)
RBC: 4.7 x10E6/uL (ref 3.77–5.28)
RDW: 12.3 % (ref 11.7–15.4)
WBC: 5 10*3/uL (ref 3.4–10.8)

## 2023-11-11 LAB — CBC WITH DIFFERENTIAL/PLATELET
Abs Immature Granulocytes: 0.03 10*3/uL (ref 0.00–0.07)
Basophils Absolute: 0 10*3/uL (ref 0.0–0.1)
Basophils Relative: 0 %
Eosinophils Absolute: 0 10*3/uL (ref 0.0–0.5)
Eosinophils Relative: 1 %
HCT: 42.7 % (ref 36.0–46.0)
Hemoglobin: 14.8 g/dL (ref 12.0–15.0)
Immature Granulocytes: 0 %
Lymphocytes Relative: 27 %
Lymphs Abs: 1.9 10*3/uL (ref 0.7–4.0)
MCH: 32 pg (ref 26.0–34.0)
MCHC: 34.7 g/dL (ref 30.0–36.0)
MCV: 92.4 fL (ref 80.0–100.0)
Monocytes Absolute: 0.6 10*3/uL (ref 0.1–1.0)
Monocytes Relative: 8 %
Neutro Abs: 4.6 10*3/uL (ref 1.7–7.7)
Neutrophils Relative %: 64 %
Platelets: 220 10*3/uL (ref 150–400)
RBC: 4.62 MIL/uL (ref 3.87–5.11)
RDW: 12.6 % (ref 11.5–15.5)
WBC: 7.2 10*3/uL (ref 4.0–10.5)
nRBC: 0 % (ref 0.0–0.2)

## 2023-11-11 LAB — COMPREHENSIVE METABOLIC PANEL WITH GFR
ALT: 16 U/L (ref 0–44)
AST: 43 U/L — ABNORMAL HIGH (ref 15–41)
Albumin: 3.7 g/dL (ref 3.5–5.0)
Alkaline Phosphatase: 41 U/L (ref 38–126)
Anion gap: 10 (ref 5–15)
BUN: 22 mg/dL (ref 8–23)
CO2: 27 mmol/L (ref 22–32)
Calcium: 10.1 mg/dL (ref 8.9–10.3)
Chloride: 102 mmol/L (ref 98–111)
Creatinine, Ser: 0.83 mg/dL (ref 0.44–1.00)
GFR, Estimated: >60 mL/min (ref >60)
Glucose, Bld: 95 mg/dL (ref 70–99)
Potassium: 4.8 mmol/L (ref 3.5–5.1)
Sodium: 139 mmol/L (ref 135–145)
Total Bilirubin: 0.9 mg/dL (ref 0.0–1.2)
Total Protein: 6.9 g/dL (ref 6.5–8.1)

## 2023-11-11 LAB — TROPONIN I (HIGH SENSITIVITY)
Troponin I (High Sensitivity): 9 ng/L (ref <18)
Troponin I (High Sensitivity): 11 ng/L (ref <18)

## 2023-11-11 LAB — VITAMIN B12: Vitamin B-12: 597 pg/mL (ref 232–1245)

## 2023-11-11 LAB — IRON,TIBC AND FERRITIN PANEL
Ferritin: 95 ng/mL (ref 15–150)
Iron Saturation: 54 % (ref 15–55)
Iron: 163 ug/dL — ABNORMAL HIGH (ref 27–139)
Total Iron Binding Capacity: 300 ug/dL (ref 250–450)
UIBC: 137 ug/dL (ref 118–369)

## 2023-11-11 LAB — TSH RFX ON ABNORMAL TO FREE T4: TSH: 0.187 u[IU]/mL — ABNORMAL LOW (ref 0.450–4.500)

## 2023-11-11 LAB — T4F: T4,Free (Direct): 1.26 ng/dL (ref 0.82–1.77)

## 2023-11-11 LAB — VITAMIN B1: Thiamine: 126.3 nmol/L (ref 66.5–200.0)

## 2023-11-11 LAB — RPR: RPR Ser Ql: NONREACTIVE

## 2023-11-11 LAB — I-STAT CG4 LACTIC ACID, ED
Lactic Acid, Venous: 1.3 mmol/L (ref 0.5–1.9)
Lactic Acid, Venous: 1.3 mmol/L (ref 0.5–1.9)

## 2023-11-11 LAB — FOLATE: Folate: >20 ng/mL (ref >3.0)

## 2023-11-11 LAB — LIPASE, BLOOD: Lipase: 28 U/L (ref 11–51)

## 2023-11-11 MED ORDER — SIMVASTATIN 20 MG PO TABS
20.0000 mg | ORAL_TABLET | Freq: Every evening | ORAL | Status: DC
  Administered 2023-11-12: 20 mg via ORAL
  Filled 2023-11-11: qty 1

## 2023-11-11 MED ORDER — GADOBUTROL 1 MMOL/ML IV SOLN
6.0000 mL | Freq: Once | INTRAVENOUS | Status: AC | PRN
  Administered 2023-11-11: 6 mL via INTRAVENOUS

## 2023-11-11 MED ORDER — IOHEXOL 350 MG/ML SOLN
60.0000 mL | Freq: Once | INTRAVENOUS | Status: AC | PRN
  Administered 2023-11-11: 60 mL via INTRAVENOUS

## 2023-11-11 MED ORDER — HYDROCHLOROTHIAZIDE 12.5 MG PO TABS
12.5000 mg | ORAL_TABLET | Freq: Every day | ORAL | Status: DC
  Administered 2023-11-12: 12.5 mg via ORAL
  Filled 2023-11-11: qty 1

## 2023-11-11 MED ORDER — ENOXAPARIN SODIUM 40 MG/0.4ML IJ SOSY
40.0000 mg | PREFILLED_SYRINGE | INTRAMUSCULAR | Status: DC
  Administered 2023-11-12: 40 mg via SUBCUTANEOUS
  Filled 2023-11-11: qty 0.4

## 2023-11-11 NOTE — Assessment & Plan Note (Addendum)
 Based on presentation and video provided by daughter, appears to be most likely behavioral vs cognitive decline from progressive dementia.  Would focus on symptomatic management as there may not be a discernible underlying cause.  Appreciate neurology recommendations regarding further evaluation of dementia or secondary neurologic process. -Neuro recs, appreciate care -EEG tomorrow  -Ammonia ordered  -UDS ordered -UA and Urine culture pending

## 2023-11-11 NOTE — Plan of Care (Signed)

## 2023-11-11 NOTE — ED Notes (Signed)
 Patient transported to CT

## 2023-11-11 NOTE — ED Notes (Signed)
 Pt unable to lay still in CT, requesting medication.

## 2023-11-11 NOTE — Hospital Course (Addendum)
 Amanda Shaw is a 89 y.o.female with a history of hypertension, HLD, osteoporosis who was admitted to the family medicine teaching Service at [facility] for possible syncope versus seizure. Her hospital course is detailed below:  Tremor episodes Patient presented to ED with unwitnessed episode of possible syncope and tremors.  Chronic history of episodes of tremors.  Neurology was consulted in the ED who recommended these episodes are likely PNES, recommended outpatient psychiatry follow-up and CBT.  MRI and CT head were both negative for acute processes.  Syncope workup was negative including EKG, and orthostatics. Spot EEG was negative. These episodes likely in the setting of chronic behavioral and cognitive decline from dementia. Lab work for altered mental status was grossly negative.  Goals of care Complicated family dynamic became apparent throughout admission.  Patient lives in home in her house.  Patient's husband has passed away.  Daughter Amanda Shaw, lives with patient and helps to take care of her.  Per her healthcare power of attorney Amanda Shaw (son, lives in [place] ), Amanda Shaw is a source of stress for his mother and he is worried that the house is unsafe. No obvious indicators of safety concern throughout admission. Patient elected to go home. Counseled family regarding future planning for safety and expected cognitive decline with dementia.   Other chronic conditions were medically managed with home medications and formulary alternatives as necessary (hypertension, hyperlipidemia)  PCP Follow-up Recommendations: Psychiatry outpatient referral and CBT for PNES and stress adaptation Likely would benefit from geriatric clinic Home Health outpatient will need to be ordered, unable to be set up over the weekend Set expectations of overall prognosis of dementia, and steps for preparing

## 2023-11-11 NOTE — Progress Notes (Signed)
 LTM EEG will be placed in the morning on 1st shift, per Derry Lory, MD.

## 2023-11-11 NOTE — ED Notes (Signed)
 Patient transported to MRI

## 2023-11-11 NOTE — H&P (Addendum)
 Hospital Admission History and Physical Service Pager: [phone]  Patient name: Amanda Shaw Medical record number: [id number] Date of Birth: Mar 27, 1930 Age: 89 y.o. Gender: female  Primary Care Provider: Candee Cha, MD Consultants: Neurology  Code Status: DNR-DNI which was confirmed with family Preferred Emergency Contact:   Contact Information     Name Relation Home Work Mobile   Shaw,Amanda Daughter   [phone]   Shaw, Amanda [phone]  [phone]      Other Contacts   None on File    Chief Complaint: " Tremor"   Assessment and Plan: NAOMI CASTROGIOVANNI is a 89 y.o. female with resenting with concerns for altered mental status, episodes of shaking and agitation in the setting of possible syncopal episode today.   Differential for presentation of her tremors includes: Dementia: Possible underlying Lewy body dementia, frontotemporal lobe dementia or Alzheimer's disease given age and progressive cognitive decline. Psychosomatic: Symptoms appear to have started after death of husband when she moved in with her daughter, appear to be exacerbated by stress.  Bizarre behavior such as face slapping but redirectability during episodes suggest behavioral component. Seizure: Low likelihood given alert and responsive during events without consistent neurologic pattern. Substance-induced: Denies illicit substance and alcohol use.  On minimal home medications likely not impacting presentation. Hyperthyroidism: Low TSH but normal T4 on outpatient blood work on 11/07/2023.  Per chart review persistently low TSH but has always been subclinical therefore unlikely to cause recent decline in function. Assessment & Plan Tremor Based on presentation and video provided by daughter, appears to be most likely behavioral vs cognitive decline from progressive dementia.  Would focus on symptomatic management as there may not be a discernible underlying cause.   Appreciate neurology recommendations regarding further evaluation of dementia or secondary neurologic process. -Neuro recs, appreciate care -EEG tomorrow  -Ammonia ordered  -UDS ordered -UA and Urine culture pending  Syncope Daughter reported concern for passing out episode during CT imaging earlier in outpatient imaging, unfortunately unable to confirm with imaging staff and patient without recollection of event.  Broad differential given no details surrounding episode but most suspicious for relation to tremoring events as below but will obtain workup to rule out secondary cardiac etiology. -Admitting to FMTS, attending Dr. Grandville Lax  -Telemetry x24 hours -Echo ordered  -Orthostatic Vitals   -PT/OT evaluation  Suprapubic pain Endorses urinary frequency and suprapubic pain although UA on 4/9 unremarkable.  CTAP normal, low concern for intra-abdominal pathology. - UA and Urine culture Subclinical hyperthyroidism Outpatient TSH 0.187 with normal T4 - T3 ordered    Chronic stable conditions:  Hypertension: Continue hydrochlorothiazide 12.5 mg daily  Hyperlipidemia: Continue Simvastatin 20 mg at bedtime  Will hold ASA 81 mg daily given no history of CVA and risk of bleeding at age   FEN/GI:Carb modified diet  VTE Prophylaxis: Lovenox   Disposition: Med- Tele   History of Present Illness:  Amanda Shaw is a 89 y.o. female presenting with concerns for altered mental status, episodes of shaking and agitation in the setting of possible syncopal episode today.  Patient was interviewed in the presence of Dr. Ival Marines.  Patient states she was heading to do a CT scan when she started to have a tremor in arms and legs and she was "barking and pooping". Episode lasted for unknown period of time. Denies prior similar experience. Denies prior history of seizures. Denies post-ictal symptoms of nausea, emesis or confusion after episodes. Patient states she "closed her eyes"  but she was alert  during episodes but daughter denies this was the case. Daughter states this is not normal for her. Not more confused than normal.   Daughter reports she passed out while in the CT scanner and staff came to get her. Reports similar episode early this AM of shaking that lasted 30 seconds-1 minute, but up at 8 mins. States she would be agitated, having coughing and crying during episodes. Adamant she is not acting like her usual self.   Patient reports episodes of increased urinary frequency and malodorous urine. Denies dysuria. Having some abdominal pain across lower abdomen that started today, mostly with urinating. Sometimes issues with pooping "stays on toilet too long". Patient denies fevers, chills, constipation,diarrhea, lightheaded, dizziness, syncope, SOB and chest pain. Tolerating food and fluids without issues. Denies issues swallowing  Husband died October 01, 2022 and now daughter Amanda Shaw moved in and helps take care of her. Denies any symptoms depression, anxiety or mania. Denies family history of psychiatric illness.    In the ED. ED Labs were unremarkable for reversible causes of of AMS or precipitation of tremors. MRI no acute intracranial abnormality. Age related cerebral volume loss w/ chronic microvascular ischemic disease. CT AP was negative fr acute abdominal pathology. Neurology was consulted for evaluation.   Review Of Systems: Per HPI  Pertinent Past Medical History: Hypertension Hyperlipidemia  Osteoporosis History of squamous cell carcinoma Osteoporosis Remainder reviewed in history tab.   Pertinent Past Surgical History: Abdominal hysterectomy Appendectomy Cholecystectomy  Tonsillectomy Remainder reviewed in history tab.   Pertinent Social History: Tobacco use: Never Alcohol use: None Other Substance use: None Lives with daughter Amanda Shaw)  Pertinent Family History: Mother heart disease Father aneurysm Brother heart disease, dementia, prostate cancer and  depression Remainder reviewed in history tab.   Important Outpatient Medications: *Took meds this Am but did not take Simvastatin as usually takes in the PM  Aspirin 81 mg daily Calcium pills Caltrate 600  D PO daily  Hydrochlorothiazide 12.5 mg tablet daily Simvastatin 20 mg at bedtime  Remainder reviewed in medication history.   Objective: BP (!) 160/67   Pulse 77   Temp 97.9 F (36.6 C) (Oral)   Resp 20   Ht 5\' 1"  (1.549 m)   Wt 58.2 kg   SpO2 98%   BMI 24.24 kg/m  Exam: General: No acute distress, not ill appearing Eyes: No scleral icterus Cardiovascular: Regular rate and rhythm, no murmurs appreciated Respiratory: Normal work of breathing, clear to auscultation bilaterally and posterior lung fields, no wheezing, crackles or rhonchi appreciated Gastrointestinal: Normoactive bowel sounds, nondistended, suprapubic tenderness to deep palpation, otherwise nontender throughout the remainder of abdomen Neuro: Alert and oriented to time, place, person and a little bit confused about situation.  Cranial nerves II through XII intact.  No focal neurological deficits observed.  Some jaw tremoring noted intermittently.  Had an episode of shaking her hands back-and-forth that lasted around 10 seconds.  Ambulated to the bathroom and back without difficulty and minimal assistance. Psych: Appropriate, cooperative, calm.  Intermittently confused.  Labs:  CBC BMET  Recent Labs  Lab 11/11/23 1800  WBC 7.2  HGB 14.8  HCT 42.7  PLT 220   Recent Labs  Lab 11/11/23 1800  NA 139  K 4.8  CL 102  CO2 27  BUN 22  CREATININE 0.83  GLUCOSE 95  CALCIUM 10.1    Pertinent additional labs:  UA and Urine culture pending    EKG: NSR HR 90, left axis deviation, ?anterior infarct,  minimal  ST depressions    Imaging Studies Performed:  CT AP:  No acute intra-abdominal or intrapelvic abnormality.   CT Head: Final read pending   MR Brain w wo contrast:  No acute intracranial  abnormality.  Age related cerebral volume loss w/ chronic microvascular ischemic disease.   Brayton Calin, MD 11/11/2023, 7:58 PM PGY-1, [facility]  FPTS Intern pager: [phone], text pages welcome Secure chat group [facility] Teaching Service    Upper Level Addendum:   I have seen and evaluated this patient along with Dr. Leslee Rase and reviewed the above note, making necessary revisions as appropriate.  I agree with the medical decision making and physical exam as noted above.   Jonne Netters, DO PGY-2, [facility] Residency

## 2023-11-11 NOTE — ED Provider Notes (Signed)
 [place] EMERGENCY DEPARTMENT AT [facility] Provider Note   CSN: [id number] Arrival date & time: 11/11/23  1636     History  Chief Complaint  Patient presents with   Altered Mental Status    Amanda Shaw is a 89 y.o. female.  HPI      89 year old female with a history of hypertension, who presents to concern for altered mental status, episodes of shaking and agitation as well as possible syncopal episode today.    She had gone to her primary care doctor's office yesterday for similar symptoms.  Daughter had reported that patient had been tearful, crying frequently, and began hitting herself in the face while she was watching TV.  Outpatient labs included UA, TSH/free T4 (low tsh, normal T4).   Daughter in law [place] at bedside comments she has been through a lot of changes, including her husband dying in Feb 2024 and now she is living with her daughter Amanda Shaw. She used to be on a schedule including early dinners, now things are different.  Her son is POA and is out of state at the moment.   Amanda Shaw took her for a CT head today and after completing the scan she had an episode of shaking followed by unresponsiveness.  Amanda Shaw had a hard time getting her up after this.  She has these episodes where she shakes on both sides for about 1 minute while she is still conscious, and at times becomes very agitated, hitting herself on the side of the face on both sides or repeating the same phrases.  She had never had an episode like she had today when she had a syncopal episode or was unconscious.    She has had some memory problems that family has noticed over months, wouldn't remember recent memories (like what she did yesterday) but does remember her birth date, remote past.  They have noted this cognitive decline however in the last week she has developed significant agitation, with Amanda Shaw stating that [name] states it is because of her.  She has not been sleeping  well, has just started having these shaking episodes.    She has not been complaining of abdominal pain at home but when I ask if she is having pain anywhere she states in her abdomen and when I ask when she states "when [employer]".  It is unclear if she is a reliable historian.  She denies chest pain, shortness of breath, nausea, vomiting, diarrhea, constipation, fever.  Family has not noticed any focal numbness, weakness, difficulty talking or walking with the exception of her not getting up today after this episode in the CT scanner.   Past Medical History:  Diagnosis Date   Arthritis    arthritis in hips    Candida infection of flexural skin 11/09/2021   Fracture of ankle, left, closed 10/1998   Gastritis 07/2006   by endoscopy   Healthcare maintenance 09/10/2021   HEMORRHOIDS, NOS 09/29/2006   Qualifier: Diagnosis of  By: Tarry Farmer MD, Wayne     Hypertension    Subungual hematoma of right foot 02/18/2015   Right great toe      Home Medications Prior to Admission medications   Medication Sig Start Date End Date Taking? Authorizing Provider  Calcium Carbonate-Vitamin D (CALTRATE 600+D PO) Take 1 tablet by mouth daily.   Yes [provider]  hydrochlorothiazide (HYDRODIURIL) 12.5 MG tablet TAKE 1 TABLET EVERY DAY Patient taking differently: Take 12.5 mg by mouth at bedtime. 06/17/23  Yes Candee Cha, MD  latanoprost (XALATAN) 0.005 % ophthalmic solution Place 1 drop into both eyes at bedtime.   Yes [provider]  simvastatin (ZOCOR) 20 MG tablet TAKE 1 TABLET EVERY EVENING 05/14/23  Yes Candee Cha, MD      Allergies    Patient has no known allergies.    Review of Systems   Review of Systems  Physical Exam Updated Vital Signs BP (!) 157/75 (BP Location: Left Arm)   Pulse 90   Temp 97.8 F (36.6 C) (Oral)   Resp 17   Ht 5\' 1"  (1.549 m)   Wt 58.2 kg   SpO2 96%   BMI 24.24 kg/m  Physical Exam Vitals and nursing note reviewed.   Constitutional:      General: She is not in acute distress.    Appearance: Normal appearance. She is well-developed. She is not ill-appearing or diaphoretic.  HENT:     Head: Normocephalic and atraumatic.     Comments: Healing lesion left ear Eyes:     General: No visual field deficit.    Extraocular Movements: Extraocular movements intact.     Conjunctiva/sclera: Conjunctivae normal.     Pupils: Pupils are equal, round, and reactive to light.  Cardiovascular:     Rate and Rhythm: Normal rate and regular rhythm.     Pulses: Normal pulses.     Heart sounds: Normal heart sounds. No murmur heard.    No friction rub. No gallop.  Pulmonary:     Effort: Pulmonary effort is normal. No respiratory distress.     Breath sounds: Normal breath sounds. No wheezing or rales.  Abdominal:     General: There is no distension.     Palpations: Abdomen is soft.     Tenderness: There is no abdominal tenderness. There is no guarding.  Musculoskeletal:        General: No swelling or tenderness.     Cervical back: Normal range of motion.  Skin:    General: Skin is warm and dry.     Findings: No erythema or rash.  Neurological:     General: No focal deficit present.     Mental Status: She is alert and oriented to person, place, and time.     GCS: GCS eye subscore is 4. GCS verbal subscore is 5. GCS motor subscore is 6.     Cranial Nerves: No cranial nerve deficit, dysarthria or facial asymmetry.     Sensory: No sensory deficit.     Motor: No weakness (initially reports "i can't yet" when asked to lift left leg but less than 2 seconds later lifted with full strength) or tremor.     Coordination: Coordination normal. Finger-Nose-Finger Test normal.     Gait: Gait normal.     Comments: Oriented to self, location, April 11 but states 2024     ED Results / Procedures / Treatments   Labs (all labs ordered are listed, but only abnormal results are displayed) Labs Reviewed  COMPREHENSIVE METABOLIC  PANEL WITH GFR - Abnormal; Notable for the following components:      Result Value   AST 43 (*)    All other components within normal limits  CBC WITH DIFFERENTIAL/PLATELET  LIPASE, BLOOD  AMMONIA  ETHANOL  CBC  URINALYSIS, W/ REFLEX TO CULTURE (INFECTION SUSPECTED)  RAPID URINE DRUG SCREEN, HOSP PERFORMED  COMPREHENSIVE METABOLIC PANEL WITH GFR  T3  HIV ANTIBODY (ROUTINE TESTING W REFLEX)  I-STAT CG4 LACTIC ACID, ED  I-STAT CG4 LACTIC ACID, ED  TROPONIN I (HIGH SENSITIVITY)  TROPONIN I (HIGH SENSITIVITY)    EKG EKG Interpretation Date/Time:  Friday November 11 2023 16:37:14 EDT Ventricular Rate:  90 PR Interval:  143 QRS Duration:  89 QT Interval:  372 QTC Calculation: 456 R Axis:   -34  Text Interpretation: Sinus rhythm Left axis deviation Low voltage, precordial leads Consider anterior infarct Minimal ST depression, lateral leads No significant change since last tracing Confirmed by Scarlette Currier ([id number]) on 11/11/2023 4:45:51 PM  Radiology CT ABDOMEN PELVIS W CONTRAST Result Date: 11/11/2023 CLINICAL DATA:  Abdominal pain, acute, nonlocalized EXAM: CT ABDOMEN AND PELVIS WITH CONTRAST TECHNIQUE: Multidetector CT imaging of the abdomen and pelvis was performed using the standard protocol following bolus administration of intravenous contrast. RADIATION DOSE REDUCTION: This exam was performed according to the departmental dose-optimization program which includes automated exposure control, adjustment of the mA and/or kV according to patient size and/or use of iterative reconstruction technique. CONTRAST:  60mL OMNIPAQUE IOHEXOL 350 MG/ML SOLN COMPARISON:  None Available. FINDINGS: Lower chest: Left anterior descending coronary calcification. Hepatobiliary: No focal liver abnormality. Status post cholecystectomy. No biliary dilatation. Pancreas: Diffusely atrophic. No focal lesion. Otherwise normal pancreatic contour. No surrounding inflammatory changes. No main pancreatic ductal  dilatation. Spleen: Normal in size without focal abnormality. Adrenals/Urinary Tract: No adrenal nodule bilaterally. Bilateral kidneys enhance symmetrically. No hydronephrosis. No hydroureter. The urinary bladder is unremarkable. Stomach/Bowel: Stomach is within normal limits. No evidence of bowel wall thickening or dilatation. The appendix is not definitely identified with no inflammatory changes in the right lower quadrant to suggest acute appendicitis. Vascular/Lymphatic: No abdominal aorta or iliac aneurysm. Mild atherosclerotic plaque of the aorta and its branches. No abdominal, pelvic, or inguinal lymphadenopathy. Reproductive: Status post hysterectomy. No adnexal masses. Other: No intraperitoneal free fluid. No intraperitoneal free gas. No organized fluid collection. Musculoskeletal: No abdominal wall hernia or abnormality. No suspicious lytic or blastic osseous lesions. No acute displaced fracture. Multilevel severe degenerative changes of the spine. L3-L4 posterolateral surgical hardware fusion and interbody fusion. Grade 1 anterolisthesis of L5 on S1. No associated severe osseous neural foraminal or central canal stenosis. IMPRESSION: No acute intra-abdominal or intrapelvic abnormality. Electronically Signed   By: Morgane  Naveau M.D.   On: 11/11/2023 22:33   MR Brain W and Wo Contrast Result Date: 11/11/2023 CLINICAL DATA:  Initial evaluation for acute mental status change, unknown cause. EXAM: MRI HEAD WITHOUT AND WITH CONTRAST TECHNIQUE: Multiplanar, multiecho pulse sequences of the brain and surrounding structures were obtained without and with intravenous contrast. CONTRAST:  6mL GADAVIST GADOBUTROL 1 MMOL/ML IV SOLN COMPARISON:  CT from earlier the same day. FINDINGS: Brain: Examination mildly degraded by motion artifact. Generalized age-related cerebral volume loss. Patchy T2/FLAIR hyperintensity involving the periventricular deep white matter both cerebral hemispheres, most likely related  chronic microvascular ischemic disease, moderate in nature. No abnormal foci of restricted diffusion to suggest acute or subacute ischemia. Gray-white matter differentiation maintained. No areas of chronic cortical infarction. No acute or chronic intracranial blood products. No mass lesion, midline shift or mass effect. No hydrocephalus or extra-axial fluid collection. Pituitary gland within normal limits. No abnormal enhancement. Vascular: Major intracranial vascular flow voids are maintained. Skull and upper cervical spine: Craniocervical junction within normal limits. Bone marrow signal intensity normal. No scalp soft tissue abnormality. Sinuses/Orbits: Globes orbital soft tissues within normal limits. Paranasal sinuses are largely clear. No significant mastoid effusion. Other: None. IMPRESSION: 1. No acute intracranial abnormality. 2. Age-related cerebral volume loss  with moderate chronic microvascular ischemic disease. Electronically Signed   By: Virgia Griffins M.D.   On: 11/11/2023 20:30    Procedures Procedures    Medications Ordered in ED Medications  simvastatin (ZOCOR) tablet 20 mg (has no administration in time range)  hydrochlorothiazide (HYDRODIURIL) tablet 12.5 mg (12.5 mg Oral Given 11/12/23 0935)  enoxaparin (LOVENOX) injection 40 mg (40 mg Subcutaneous Given 11/12/23 0935)  gadobutrol (GADAVIST) 1 MMOL/ML injection 6 mL (6 mLs Intravenous Contrast Given 11/11/23 1854)  iohexol (OMNIPAQUE) 350 MG/ML injection 60 mL (60 mLs Intravenous Contrast Given 11/11/23 2128)    ED Course/ Medical Decision Making/ A&P                                   89 year old female with a history of hypertension, who presents to concern for altered mental status, episodes of shaking and agitation as well as possible syncopal episode today.   DDx includes cardiac arrhythmia, electrolyte abnormality, anemia, intracranial hemorrhage, CVA, seizures, psychogenic nonepileptic seizures, infection, AAA,  behavioral, dementia with agitation.  Labs obtained and evaluated by me show no clinically significant findings. Had low TSH with normal T4 on outpatient labs.  Ammonia WNL.  CT head that was completed earlier today today was evaluated by me and showed no evidence of intracranial bleeding.  Will evaluate with MRI with and without.  EEG ordered.    CT abdomen pelvis ordered given abdominal tenderness and pain on exam shows no acute intraabdominal abnormalities.  Had an episode in the ED when she was shaking all over, initially not responding but then did appear to respond to external stimuli and stop and return to baseline.  Difficult to discern heart rhythm however when monitor held still to chest or arms held more still appears to be baseline sinus rhythm with artifact.    Will plan to admit for syncope/seizure-like activity for continued monitoring. Neurology consulted.          Final Clinical Impression(s) / ED Diagnoses Final diagnoses:  Syncope, unspecified syncope type  Altered mental status, unspecified altered mental status type  Seizure-like activity (HCC)  Abdominal pain, unspecified abdominal location    Rx / DC Orders ED Discharge Orders     None         Scarlette Currier, MD 11/12/23 [phone]

## 2023-11-11 NOTE — ED Notes (Signed)
 Pt taken to CT.

## 2023-11-11 NOTE — Assessment & Plan Note (Addendum)
 Endorses urinary frequency and suprapubic pain although UA on 4/9 unremarkable.  CTAP normal, low concern for intra-abdominal pathology. - UA and Urine culture
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
## 2023-11-11 NOTE — Assessment & Plan Note (Addendum)
 Daughter reported concern for passing out episode during CT imaging earlier in outpatient imaging, unfortunately unable to confirm with imaging staff and patient without recollection of event.  Broad differential given no details surrounding episode but most suspicious for relation to tremoring events as below but will obtain workup to rule out secondary cardiac etiology. -Admitting to FMTS, attending Dr. Grandville Lax  -Telemetry x24 hours -Echo ordered  -Orthostatic Vitals   -PT/OT evaluation
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
## 2023-11-12 ENCOUNTER — Observation Stay (HOSPITAL_COMMUNITY)

## 2023-11-12 DIAGNOSIS — R569 Unspecified convulsions: Secondary | ICD-10-CM

## 2023-11-12 DIAGNOSIS — R55 Syncope and collapse: Secondary | ICD-10-CM | POA: Diagnosis not present

## 2023-11-12 DIAGNOSIS — R451 Restlessness and agitation: Secondary | ICD-10-CM | POA: Diagnosis not present

## 2023-11-12 DIAGNOSIS — F0390 Unspecified dementia without behavioral disturbance: Secondary | ICD-10-CM | POA: Diagnosis not present

## 2023-11-12 DIAGNOSIS — R4182 Altered mental status, unspecified: Secondary | ICD-10-CM

## 2023-11-12 LAB — COMPREHENSIVE METABOLIC PANEL WITH GFR
ALT: 15 U/L (ref 0–44)
AST: 25 U/L (ref 15–41)
Albumin: 3.3 g/dL — ABNORMAL LOW (ref 3.5–5.0)
Alkaline Phosphatase: 41 U/L (ref 38–126)
Anion gap: 11 (ref 5–15)
BUN: 21 mg/dL (ref 8–23)
CO2: 25 mmol/L (ref 22–32)
Calcium: 9.6 mg/dL (ref 8.9–10.3)
Chloride: 101 mmol/L (ref 98–111)
Creatinine, Ser: 0.92 mg/dL (ref 0.44–1.00)
GFR, Estimated: 59 mL/min — ABNORMAL LOW (ref >60)
Glucose, Bld: 103 mg/dL — ABNORMAL HIGH (ref 70–99)
Potassium: 3.8 mmol/L (ref 3.5–5.1)
Sodium: 137 mmol/L (ref 135–145)
Total Bilirubin: 0.7 mg/dL (ref 0.0–1.2)
Total Protein: 6.4 g/dL — ABNORMAL LOW (ref 6.5–8.1)

## 2023-11-12 LAB — CBC
HCT: 40 % (ref 36.0–46.0)
Hemoglobin: 13.7 g/dL (ref 12.0–15.0)
MCH: 31.8 pg (ref 26.0–34.0)
MCHC: 34.3 g/dL (ref 30.0–36.0)
MCV: 92.8 fL (ref 80.0–100.0)
Platelets: 213 10*3/uL (ref 150–400)
RBC: 4.31 MIL/uL (ref 3.87–5.11)
RDW: 12.4 % (ref 11.5–15.5)
WBC: 5.5 10*3/uL (ref 4.0–10.5)
nRBC: 0 % (ref 0.0–0.2)

## 2023-11-12 LAB — ECHOCARDIOGRAM COMPLETE
AR max vel: 3.18 cm2
AV Area VTI: 3.07 cm2
AV Area mean vel: 2.87 cm2
AV Mean grad: 4 mmHg
AV Peak grad: 7.1 mmHg
Ao pk vel: 1.33 m/s
Area-P 1/2: 5.27 cm2
Height: 61 in
S' Lateral: 3.2 cm
Weight: 2052.92 [oz_av]

## 2023-11-12 LAB — HIV ANTIBODY (ROUTINE TESTING W REFLEX): HIV Screen 4th Generation wRfx: NONREACTIVE

## 2023-11-12 LAB — ETHANOL: Alcohol, Ethyl (B): <10 mg/dL (ref <10)

## 2023-11-12 NOTE — Progress Notes (Signed)
 OT Cancellation Note  Patient Details Name: WALLY BEHAN MRN: [id number] DOB: 1930/04/09   Cancelled Treatment:    Reason Eval/Treat Not Completed: Patient at procedure or test/ unavailable.  Will continue to attempt eval as schedule allows   Clementina Cutter 11/12/2023, 11:22 AM Chantal Comment OTR/L

## 2023-11-12 NOTE — Evaluation (Signed)
 Physical Therapy Evaluation Patient Details Name: Amanda Shaw MRN: [id number] DOB: 1930-06-04 Today's Date: 11/12/2023  History of Present Illness  Pt is 89 year old female brought to the ER on 11/11/23 with AMS, tremors, agitation (slapping self at times) and possible syncope.  MRI head and abdominal CT: negative.  EEG pending. 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 PMH HTN, HTN, dementia.  Clinical Impression   Pt admitted with above diagnosis. At baseline, pt independent without cues needed, ambulates without AD, likes to work in her garden.  Daughter does report increased instability with recent episodes with several loss of balance but no falls that she is aware of.  Pt lives with dtr who provides 24 hr care.  Today, pt able to ambulate 200' with CGA without AD, but she did have decreased safety awareness with instability needing CGA and frequent cues.  Needed frequent cues for transfers and initiation.  Pt is below her baseline and would benefit from further therapy acute and at d/c.  Pt's daughter feels that with pt's anxiety/current episodes would do better with HHPT over having to go to outpt setting.   Pt currently with functional limitations due to the deficits listed below (see PT Problem List). Pt will benefit from acute skilled PT to increase their independence and safety with mobility to allow discharge.     Noted some concern of syncope and orders for orthostatic BP.  BP as follows: Supine 157/75 HR 90 Sitting 158/80 HR 99 Standing 163/89 HR 99 No lightheadedness         If plan is discharge home, recommend the following: A little help with walking and/or transfers;A little help with bathing/dressing/bathroom;Assistance with cooking/housework;Help with stairs or ramp for entrance   Can travel by private vehicle        Equipment Recommendations None  recommended by PT  Recommendations for Other Services       Functional Status Assessment Patient has had a recent decline in their functional status and demonstrates the ability to make significant improvements in function in a reasonable and predictable amount of time.     Precautions / Restrictions Precautions Precautions: Fall      Mobility  Bed Mobility Overal bed mobility: Needs Assistance Bed Mobility: Supine to Sit, Sit to Supine     Supine to sit: Contact guard Sit to supine: Contact guard assist   General bed mobility comments: Cues for sequencing, initiation    Transfers Overall transfer level: Needs assistance Equipment used: None Transfers: Sit to/from Stand Sit to Stand: Contact guard assist, Min assist           General transfer comment: CGA for safety from bed and min A from toilet; cues for initiation; pt performed toielting ADLs but mod cues    Ambulation/Gait Ambulation/Gait assistance: Contact guard assist Gait Distance (Feet): 200 Feet Assistive device: None Gait Pattern/deviations: Step-through pattern       General Gait Details: initially starting at slower speed progressed to normal but then continued to more of a speed walk with arm swing; too fast for safety and needed cues to slow down.  Tends to have increased forward momentum and did have 1 stumble when at fast speed  Stairs            Wheelchair Mobility     Tilt Bed    Modified Rankin (Stroke Patients Only)       Balance Overall balance assessment:  Needs assistance Sitting-balance support: No upper extremity supported Sitting balance-Leahy Scale: Good     Standing balance support: No upper extremity supported Standing balance-Leahy Scale: Good Standing balance comment: able to ambulate without AD but does have some instability with higher level task                             Pertinent Vitals/Pain Pain Assessment Pain Assessment: No/denies pain     Home Living Family/patient expects to be discharged to:: Private residence Living Arrangements: Children Available Help at Discharge: Family;Available 24 hours/day Type of Home: House Home Access: Ramped entrance       Home Layout: One level Home Equipment: Wheelchair - Forensic psychologist (2 wheels);Cane - single point;BSC/3in1;Shower seat;Grab bars - tub/shower;Grab bars - toilet      Prior Function Prior Level of Function : Independent/Modified Independent             Mobility Comments: Walking without AD; could ambulate in community; Daughter does report increased instability recently but no falls. ADLs Comments: likes to work in garden; independent with adls     Extremity/Trunk Assessment   Upper Extremity Assessment Upper Extremity Assessment: Generalized weakness    Lower Extremity Assessment Lower Extremity Assessment: Difficult to assess due to impaired cognition;Generalized weakness    Cervical / Trunk Assessment Cervical / Trunk Assessment: Normal  Communication        Cognition Arousal: Alert Behavior During Therapy: Anxious   PT - Cognitive impairments: Sequencing, Initiation, Orientation, Awareness, Safety/Judgement, Problem solving                       PT - Cognition Comments: Pt oriented to self and place.  Does have periods of increased tremors that ease with relaxation cues.  Needs frequent cues for initiation and sequencing with transfers and adls.         Cueing       General Comments General comments (skin integrity, edema, etc.): Daughter expressed that pt did fairly well today but does have times when her balance is not as good and near falls.  She also expressed hoping to have mechanisms/techniques to help with pt's agitated episodes where she hits herself.  Daughter already providing relaxation cues for breathing and gentle encouraging words to pt - discussed these are good techniques.  She also reports pt likes to get  out walk on beach to pick up shells so they try to get out in yard and pick up pinecones.  Discussed another good technique along with relaxation and imagiary.  Daughter feels increased therapy and activity would additionally help- PT agrees.    Exercises     Assessment/Plan    PT Assessment Patient needs continued PT services  PT Problem List Decreased coordination;Decreased cognition;Decreased activity tolerance;Decreased balance;Decreased safety awareness;Decreased mobility       PT Treatment Interventions DME instruction;Therapeutic exercise;Gait training;Stair training;Functional mobility training;Therapeutic activities;Patient/family education;Cognitive remediation;Neuromuscular re-education;Modalities;Balance training    PT Goals (Current goals can be found in the Care Plan section)  Acute Rehab PT Goals Patient Stated Goal: return home PT Goal Formulation: With patient/family Time For Goal Achievement: 11/26/23 Potential to Achieve Goals: Good Additional Goals Additional Goal #1: Pt will score >19 on DGI to indicate lower fall risk    Frequency Min 1X/week     Co-evaluation               AM-PAC PT "6 Clicks" Mobility  Outcome Measure  Help needed turning from your back to your side while in a flat bed without using bedrails?: A Little Help needed moving from lying on your back to sitting on the side of a flat bed without using bedrails?: A Little Help needed moving to and from a bed to a chair (including a wheelchair)?: A Little Help needed standing up from a chair using your arms (e.g., wheelchair or bedside chair)?: A Little Help needed to walk in hospital room?: A Little Help needed climbing 3-5 steps with a railing? : A Little 6 Click Score: 18    End of Session Equipment Utilized During Treatment: Gait belt Activity Tolerance: Patient tolerated treatment well Patient left: in bed;with call bell/phone within reach;with bed alarm set (daughter present and  removed mitts so pt can eat, daughter able to reapply mitts when finished) Nurse Communication: Mobility status PT Visit Diagnosis: Unsteadiness on feet (R26.81)    Time: 1610-9604 PT Time Calculation (min) (ACUTE ONLY): 38 min   Charges:   PT Evaluation $PT Eval Low Complexity: 1 Low PT Treatments $Gait Training: 8-22 mins $Therapeutic Activity: 8-22 mins PT General Charges $$ ACUTE PT VISIT: 1 Visit         Cyd Dowse, PT Acute Rehab Services [facility] [phone]   Carolynn Citrin 11/12/2023, 11:30 AM

## 2023-11-12 NOTE — Assessment & Plan Note (Signed)
 Abdominal pain resolved this a.m. after bowel movement. - Urine culture pending

## 2023-11-12 NOTE — Assessment & Plan Note (Signed)
 No tremor on exam, videos provided by patient's daughter exam consistent with agitation versus PNES.  Lab workup negative. -Neuro recs, appreciate care -stable for discharge, follow-up with outpatient psychiatry and CBTg

## 2023-11-12 NOTE — Progress Notes (Signed)
 Pt's son Landon Pinion) has faxed over POA documentation. Placed in shadow chart and handed to nurse sec.

## 2023-11-12 NOTE — Assessment & Plan Note (Signed)
 No clear indication the syncopal event happened.  Echo pending, orthostatics negative, reassuring EKG. -PT/OT evaluation

## 2023-11-12 NOTE — Progress Notes (Signed)
     Daily Progress Note Intern Pager: [phone]  Patient name: Amanda Shaw Medical record number: [id number] Date of birth: Dec 24, 1929 Age: 89 y.o. Gender: female  Primary Care Provider: Candee Cha, MD Consultants: None Code Status: Limited  Pt Overview and Major Events to Date:  -Admitted 11/11/2023  Assessment and Plan:  Amanda Shaw is 89 y.o. admitted for concern for syncope versus seizure. Pertinent PMH/PSH includes dementia.  Assessment & Plan Tremor No tremor on exam, videos provided by patient's daughter exam consistent with agitation versus PNES.  Lab workup negative. -Neuro recs, appreciate care -stable for discharge, follow-up with outpatient psychiatry and CBTg Syncope No clear indication the syncopal event happened.  Echo pending, orthostatics negative, reassuring EKG. -PT/OT evaluation  Suprapubic pain Abdominal pain resolved this a.m. after bowel movement. - Urine culture pending Subclinical hyperthyroidism Outpatient TSH 0.187 with normal T4 - T3 ordered     FEN/GI: Regular diet PPx: Lovenox Dispo: Likely today to home with home health PT  Subjective:  No acute events overnight.  States she is doing okay.  Daughter Catherine Cloud present at bedside, difficult to redirect focus on patient.  Objective: Temp:  [97.8 F (36.6 C)-98.2 F (36.8 C)] 97.8 F (36.6 C) (04/12 1225) Pulse Rate:  [77-96] 95 (04/12 1225) Resp:  [17-23] 23 (04/12 1225) BP: (147-164)/(63-82) 150/63 (04/12 1225) SpO2:  [96 %-100 %] 96 % (04/12 0941) Weight:  [58.2 kg] 58.2 kg (04/11 1702) Physical Exam: General: NAD, comfortable, elderly Neuro: A&Ox3 Cardiovascular: RRR, no murmurs, no peripheral edema Respiratory: normal WOB on RA, CTAB, no wheezes, ronchi or rales Abdomen: soft, NTTP, no rebound or guarding Extremities: Moving all 4 extremities equally   Laboratory: Most recent CBC Lab Results  Component Value Date   WBC 5.5 11/12/2023   HGB 13.7  11/12/2023   HCT 40.0 11/12/2023   MCV 92.8 11/12/2023   PLT 213 11/12/2023   Most recent BMP    Latest Ref Rng & Units 11/12/2023    7:53 AM  BMP  Glucose 70 - 99 mg/dL 284   BUN 8 - 23 mg/dL 21   Creatinine 1.32 - 1.00 mg/dL 4.40   Sodium 102 - 725 mmol/L 137   Potassium 3.5 - 5.1 mmol/L 3.8   Chloride 98 - 111 mmol/L 101   CO2 22 - 32 mmol/L 25   Calcium 8.9 - 10.3 mg/dL 9.6     Echo pending EEG negative  Imaging/Diagnostic Tests: No new imaging  Ivin Marrow, MD 11/12/2023, 1:43 PM  PGY-2, [facility] FPTS Intern pager: [phone], text pages welcome Secure chat group [facility] Teaching Service

## 2023-11-12 NOTE — Procedures (Signed)
 Patient Name: Amanda Shaw  MRN: [id number]  Epilepsy Attending: Arleene Lack  Referring Physician/Provider: Scarlette Currier, MD  Date: 11/12/2023 Duration: 22.24 mins  Patient history:  89 y.o. female with hx of HTN, dementia, HLD who we were asked to evaluate for episodes of arms flailing, slapping herself in the face, agitated that started a couple days ago. EEG to evaluate for seizure  Level of alertness: Awake  AEDs during EEG study: None  Technical aspects: This EEG study was done with scalp electrodes positioned according to the 10-20 International system of electrode placement. Electrical activity was reviewed with band pass filter of 1-70Hz , sensitivity of 7 uV/mm, display speed of 32mm/sec with a 60Hz  notched filter applied as appropriate. EEG data were recorded continuously and digitally stored.  Video monitoring was available and reviewed as appropriate.  Description: The posterior dominant rhythm consists of 9-10 Hz activity of moderate voltage (25-35 uV) seen predominantly in posterior head regions, symmetric and reactive to eye opening and eye closing. Physiologic photic driving was seen during photic stimulation. Hyperventilation was not performed.     IMPRESSION: This study is within normal limits. No seizures or epileptiform discharges were seen throughout the recording.  A normal interictal EEG does not exclude the diagnosis of epilepsy.  Amanda Shaw

## 2023-11-12 NOTE — Assessment & Plan Note (Signed)
 Outpatient TSH 0.187 with normal T4 - T3 ordered

## 2023-11-12 NOTE — Evaluation (Signed)
 Occupational Therapy Evaluation Patient Details Name: Amanda Shaw MRN: [id number] DOB: [birth year] Today's Date: 11/12/2023   History of Present Illness   Pt is 89 year old female brought to the ER on 11/11/23 with AMS, tremors, agitation (slapping self at times) and possible syncope.  MRI head and abdominal CT: negative.  EEG pending. 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 PMH HTN, HTN, dementia.     Clinical Impressions Prior to admission patient was able to complete her adls with modified independence.   Currently patient appears to be at baseline with most activities and min A for lower body dressing and bathing.  No cognitive problems,  tremors or difficulty using her upper extremities during adl activity was noted.  The daughter expressed feeling overwhelmed with her care.  I do not feel further OT on acute is warranted, however I am recommending Home Health OT to assess her abilities in the home environment     If plan is discharge home, recommend the following:   A little help with bathing/dressing/bathroom;Help with stairs or ramp for entrance     Functional Status Assessment   Patient has had a recent decline in their functional status, but appears close to baseline.     Equipment Recommendations   None recommended by OT     Recommendations for Other Services         Precautions/Restrictions   Precautions Precautions: Fall Restrictions Weight Bearing Restrictions Per Provider Order: No     Mobility Bed Mobility Overal bed mobility: Modified Independent Bed Mobility: Supine to Sit, Sit to Supine     Supine to sit: Modified independent (Device/Increase time), HOB elevated Sit to supine: Modified independent (Device/Increase time)   General bed mobility comments: Able to mange covers without difficulty    Transfers Overall transfer  level: Needs assistance Equipment used: None Transfers: Sit to/from Stand Sit to Stand: Supervision                  Balance Overall balance assessment: Independent Sitting-balance support: Feet supported, No upper extremity supported Sitting balance-Leahy Scale: Good     Standing balance support: No upper extremity supported Standing balance-Leahy Scale: Good                             ADL either performed or assessed with clinical judgement   ADL Overall ADL's : Needs assistance/impaired Eating/Feeding: Independent   Grooming: Wash/dry hands;Wash/dry face;Standing;Supervision/safety   Upper Body Bathing: Supervision/ safety;Standing   Lower Body Bathing: Minimal assistance;Sit to/from stand (to reach L foot)   Upper Body Dressing : Independent;Sitting   Lower Body Dressing: Minimal assistance;Sit to/from stand (to reach Lfoot)   Toilet Transfer: Supervision/safety;Ambulation;Regular Social worker and Hygiene: Independent;Sitting/lateral lean       Functional mobility during ADLs: Supervision/safety General ADL Comments: Patient had no LOB with adl activity during evaluation     Vision Baseline Vision/History: 0 No visual deficits Ability to See in Adequate Light: 0 Adequate Patient Visual Report: No change from baseline       Perception         Praxis         Pertinent Vitals/Pain Pain Assessment Pain Assessment: No/denies pain     Extremity/Trunk Assessment Upper Extremity Assessment Upper Extremity Assessment: Generalized weakness   Lower Extremity Assessment Lower Extremity Assessment: Defer to PT evaluation  Cervical / Trunk Assessment Cervical / Trunk Assessment: Normal   Communication Communication Communication: No apparent difficulties   Cognition Arousal: Alert Behavior During Therapy: Anxious Cognition: No apparent impairments                               Following  commands: Intact       Cueing  General Comments   Cueing Techniques: Verbal cues  Daughter, son and daughter in law present   Exercises     Shoulder Instructions      Home Living Family/patient expects to be discharged to:: Private residence Living Arrangements: Children Available Help at Discharge: Family;Available 24 hours/day Type of Home: House Home Access: Ramped entrance     Home Layout: One level     Bathroom Shower/Tub: Sponge bathes at baseline   Bathroom Toilet: Handicapped height     Home Equipment: Wheelchair - Forensic psychologist (2 wheels);Cane - single point;BSC/3in1;Shower seat;Grab bars - tub/shower;Grab bars - toilet          Prior Functioning/Environment Prior Level of Function : Independent/Modified Independent               ADLs Comments: primarily sponge bathes, but does get in the shower    OT Problem List:     OT Treatment/Interventions:        OT Goals(Current goals can be found in the care plan section)       OT Frequency:       Co-evaluation              AM-PAC OT "6 Clicks" Daily Activity     Outcome Measure Help from another person eating meals?: None Help from another person taking care of personal grooming?: None Help from another person toileting, which includes using toliet, bedpan, or urinal?: A Little Help from another person bathing (including washing, rinsing, drying)?: A Little Help from another person to put on and taking off regular upper body clothing?: A Little Help from another person to put on and taking off regular lower body clothing?: A Little 6 Click Score: 20   End of Session Nurse Communication: Other (comment) (adl status)  Activity Tolerance: Patient tolerated treatment well Patient left: with family/visitor present;in bed (sitting EOB eating)                   Time: 1610-9604 OT Time Calculation (min): 34 min Charges:  OT General Charges $OT Visit: 1 Visit OT Evaluation $OT  Eval Moderate Complexity: 1 Mod OT Treatments $Self Care/Home Management : 8-22 mins  Chantal Comment OTR/L   Amanda Shaw 11/12/2023, 4:06 PM

## 2023-11-12 NOTE — Progress Notes (Signed)
 Echocardiogram 2D Echocardiogram has been performed.  Amanda Shaw N Braxson Hollingsworth,RDCS 11/12/2023, 12:13 PM

## 2023-11-12 NOTE — Consult Note (Signed)
 NEUROLOGY CONSULT NOTE   Date of service: November 12, 2023 Patient Name: Amanda Shaw MRN:  [id number] DOB:  1930-05-14 Chief Complaint: "episodes of shaking concerning for seizure vs pnes" Requesting Provider: Arn Lane, MD  History of Present Illness  Amanda Shaw is a 89 y.o. female with hx of HTN, dementia, HLD who we were asked to evaluate for episodes of arms flailing, slapping herself in the face, agitated that starte a couple days ago.  Patient was being worked up outpatient for episodes of agitation and ?hitting herself in the face.  Patient's daughter brought her in for concerns that she has been having episodes of slapping herself in the face and hand shaking over the last couple days.  Patient recently lost her husband and had to move in with daughter.  Patient endorses stress.  Daughter supplements history and confirms the patient is very stressed.  Daughter reports the patient has told her that she is a part of the reason why she is stressed.  Her daughter is a retired IT sales professional and has noted episodes where patient is cleaning her hands or slapping herself in the face repeatedly.  She has these events captured on the video which she was able to show me.  I was also able to witness patient having event of bizarre behavior when requested he to describe these events to me. She starts flailing her hands and then stops a few seconds later and tells me that this is what is looks like.  She denies any warning symptoms, she has no postictal period.  She denies any fever, no signs or symptoms of a URI or UTI or gastroenteritis.  She denies any personal history of seizures, no family history of seizures, does not drink alcohol, does not use any recreational substances.  She denies any significant recent head injury with loss of consciousness.  She denies any history of brain tumors or strokes in the past.  She was brought into the ED by daughter and she had an MRI of  the brain without contrast which was negative for acute intracranial abnormalities.   ROS  Comprehensive ROS performed and pertinent positives documented in HPI   Past History   Past Medical History:  Diagnosis Date   Arthritis    arthritis in hips    Candida infection of flexural skin 11/09/2021   Fracture of ankle, left, closed 10/1998   Gastritis 07/2006   by endoscopy   Healthcare maintenance 09/10/2021   HEMORRHOIDS, NOS 09/29/2006   Qualifier: Diagnosis of  By: Tarry Farmer MD, [facility]     Hypertension    Subungual hematoma of right foot 02/18/2015   Right great toe     Past Surgical History:  Procedure Laterality Date   ABDOMINAL HYSTERECTOMY     APPENDECTOMY     BACK SURGERY     BLADDER SUSPENSION  01/2004   CHOLECYSTECTOMY  1987   CRYOABLATION     bridge of nose, Dr Rochelle Chu   EXCISION/RELEASE BURSA HIP  09/22/2011   Procedure: EXCISION/RELEASE BURSA HIP;  Surgeon: Aurther Blue, MD;  Location: [facility];  Service: Orthopedics;  Laterality: Right;  right hip bursectomy    LUMBAR DISC SURGERY  [phone]   SPIDER VEIN INJECTION Bilateral 2010   TONSILLECTOMY  1956   TRABECULECTOMY Bilateral    laser   VAGINAL HYSTERECTOMY  1988   benign disease    Family History: Family History  Problem Relation Age of Onset   Heart disease Mother 74  Anuerysm Father 64   Heart disease Brother 64       Dementia    Social History  reports that she has never smoked. She has never used smokeless tobacco. She reports that she does not drink alcohol and does not use drugs.  Allergies  Allergen Reactions   Levofloxacin     calf pain    Medications   Current Facility-Administered Medications:    enoxaparin (LOVENOX) injection 40 mg, 40 mg, Subcutaneous, Q24H, Amanda Netters, MD   hydrochlorothiazide (HYDRODIURIL) tablet 12.5 mg, 12.5 mg, Oral, Daily, Amanda Netters, MD   simvastatin (ZOCOR) tablet 20 mg, 20 mg, Oral, QPM, Amanda Netters, MD  Vitals   Vitals:    11/11/23 1715 11/11/23 1745 11/11/23 2152 11/11/23 2200  BP: (!) 159/77 (!) 160/67 (!) 158/68   Pulse: 83 77 96   Resp: (!) 22 20 18 20   Temp:   98.2 F (36.8 C)   TempSrc:   Oral   SpO2: 97% 98% 99%   Weight:      Height:        Body mass index is 24.24 kg/m.  Physical Exam   General: Laying comfortably in bed; in no acute distress.  HENT: Normal oropharynx and mucosa. Normal external appearance of ears and nose.  Neck: Supple, no pain or tenderness  CV: No JVD. No peripheral edema.  Pulmonary: Symmetric Chest rise. Normal respiratory effort.  Abdomen: Soft to touch, non-tender.  Ext: No cyanosis, edema, or deformity  Skin: No rash. Normal palpation of skin.   Musculoskeletal: Normal digits and nails by inspection. No clubbing.   Neurologic Examination  Mental status/Cognition: Alert, oriented to self, place, month and year, good attention.  Speech/language: Fluent, comprehension intact, object naming intact, repetition intact.  Cranial nerves:   CN II Pupils equal and reactive to light, no VF deficits    CN III,IV,VI EOM intact, no gaze preference or deviation, no nystagmus    CN V normal sensation in V1, V2, and V3 segments bilaterally    CN VII no asymmetry, no nasolabial fold flattening    CN VIII normal hearing to speech    CN IX & X normal palatal elevation, no uvular deviation    CN XI 5/5 head turn and 5/5 shoulder shrug bilaterally    CN XII midline tongue protrusion    Motor:  Muscle bulk: normal, tone normal, pronator drift none tremor none Mvmt Root Nerve  Muscle Right Left Comments  SA C5/6 Ax Deltoid 5 5   EF C5/6 Mc Biceps 5 5   EE C6/7/8 Rad Triceps 5 5   WF C6/7 Med FCR     WE C7/8 PIN ECU     F Ab C8/T1 U ADM/FDI 5 5   HF L1/2/3 Fem Illopsoas 5 5   KE L2/3/4 Fem Quad 5 5   DF L4/5 D Peron Tib Ant 5 5   PF S1/2 Tibial Grc/Sol 5 5    Sensation:  Light touch Intact throughout   Pin prick    Temperature    Vibration   Proprioception     Coordination/Complex Motor:  - Finger to Nose intact bilaterally - Heel to shin intact bilaterally - Rapid alternating movement are normal - Gait: Deferred for patient's safety.  Labs/Imaging/Neurodiagnostic studies   CBC:  Recent Labs  Lab 23-Nov-2023 1519 11/11/23 1800  WBC 5.0 7.2  NEUTROABS  --  4.6  HGB 14.8 14.8  HCT 44.4 42.7  MCV 95 92.4  PLT 230 220  Basic Metabolic Panel:  Lab Results  Component Value Date   NA 139 11/11/2023   K 4.8 11/11/2023   CO2 27 11/11/2023   GLUCOSE 95 11/11/2023   BUN 22 11/11/2023   CREATININE 0.83 11/11/2023   CALCIUM 10.1 11/11/2023   GFRNONAA >60 11/11/2023   GFRAA 55 (L) 09/09/2020   Lipid Panel:  Lab Results  Component Value Date   LDLCALC 73 04/28/2023   HgbA1c:  Lab Results  Component Value Date   HGBA1C 5.7 (A) 09/10/2021   Urine Drug Screen: No results found for: "LABOPIA", "COCAINSCRNUR", "LABBENZ", "AMPHETMU", "THCU", "LABBARB"  Alcohol Level     Component Value Date/Time   ETH <10 11/12/2023 0033   INR No results found for: "INR" APTT No results found for: "APTT" AED levels: No results found for: "PHENYTOIN", "ZONISAMIDE", "LAMOTRIGINE", "LEVETIRACETA"  CT Head without contrast(Personally reviewed): CTH was negative for a large hypodensity concerning for a large territory infarct or hyperdensity concerning for an ICH  MRI Brain(Personally reviewed): 1. No acute intracranial abnormality. 2. Age-related cerebral volume loss with moderate chronic microvascular ischemic disease.  ASSESSMENT   Amanda Shaw is a 89 y.o. female with hx of HTN, dementia, HLD who we were asked to evaluate for episodes of arms flailing, slapping herself in the face, agitated that started a couple days ago.  I was able to review videos of these events that daughter had captured on her phone. I was also able to witness the event in the room when I asked patient about the events. Patient flails her arms and then looks  around. This lasted a few seconds and then resolved.  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 She has no prodrome, no post ictal period.  I discussed with patient and daughter at the bedside the basis of these events and the role of reducing stress and learning to cope with stress in a healthier way to reduce these episodes.  RECOMMENDATIONS  - Okay to discharge home from a neurology standpoint. The episodes I witnessed myself and the video of the events that daughter had captured on the phone are consistent with PNES. cEEG is likely going to be low yield. - recommend outpatient phschiatry and psychotherapy follow up. - we will signoff. Please feel free to contact us  with any questions or concerns. ______________________________________________________________________  Signed, Danee Soller, MD [facility]

## 2023-11-12 NOTE — Discharge Summary (Signed)
 Family Medicine Teaching [facility] Discharge Summary  Patient name: Amanda Shaw Medical record number: [id number] Date of birth: 07-Oct-1929 Age: 89 y.o. Gender: female Date of Admission: 11/11/2023  Date of Discharge: 11/12/22 Admitting Physician: Arn Lane, MD  Primary Care Provider: Candee Cha, MD Consultants: Neurology  Indication for Hospitalization: Syncope/Seizure concern  Discharge Diagnoses/Problem List:  Principal Problem for Admission: PNES Other Problems addressed during stay:  Principal Problem:   Syncope Active Problems:   Subclinical hyperthyroidism   Tremor   Suprapubic pain   Altered mental status   Seizure-like activity [facility])    Brief Hospital Course:  Amanda Shaw is a 89 y.o.female with a history of hypertension, HLD, osteoporosis who was [facility] family medicine [facility] for possible syncope versus seizure. Her hospital course is detailed below:  Tremor episodes Patient presented to ED with unwitnessed episode of possible syncope and tremors.  Chronic history of episodes of tremors.  Neurology was consulted in the ED who recommended these episodes are likely PNES, recommended outpatient psychiatry follow-up and CBT.  MRI and CT head were both negative for acute processes.  Syncope workup was negative including EKG, and orthostatics. Spot EEG was negative. These episodes likely in the setting of chronic behavioral and cognitive decline from dementia. Lab work for altered mental status was grossly negative.  Goals of care Complicated family dynamic became apparent throughout admission.  Patient lives in home in her house.  Patient's husband has passed away.  Daughter [name], lives with patient and helps to take care of her.  Per her healthcare power of attorney Mara Seminole (son, lives in [place] ), [name] is a source of stress for his mother and he is worried that the house is unsafe. No obvious  indicators of safety concern throughout admission. Patient elected to go home. Counseled family regarding future planning for safety and expected cognitive decline with dementia.   Other chronic conditions were medically managed with home medications and formulary alternatives as necessary (hypertension, hyperlipidemia)  PCP Follow-up Recommendations: Psychiatry outpatient referral and CBT for PNES and stress adaptation Likely would benefit from geriatric clinic Home Health outpatient will need to be ordered, unable to be set up over the weekend Set expectations of overall prognosis of dementia, and steps for preparing    Disposition: home  Discharge Condition: stable  Discharge Exam:  Vitals:   11/12/23 1225 11/12/23 1600  BP: (!) 150/63 (!) 149/65  Pulse: 95 96  Resp: (!) 23   Temp: 97.8 F (36.6 C) 97.8 F (36.6 C)  SpO2: 97% 98%   General: NAD, comfortable, elderly Neuro: A&Ox3 Cardiovascular: RRR, no murmurs, no peripheral edema Respiratory: normal WOB on RA, CTAB, no wheezes, ronchi or rales Abdomen: soft, NTTP, no rebound or guarding Extremities: Moving all 4 extremities equally   Significant Procedures: None  Significant Labs and Imaging:  Recent Labs  Lab 11/11/23 1800 11/12/23 0753  WBC 7.2 5.5  HGB 14.8 13.7  HCT 42.7 40.0  PLT 220 213   Recent Labs  Lab 11/11/23 1800 11/12/23 0753  NA 139 137  K 4.8 3.8  CL 102 101  CO2 27 25  GLUCOSE 95 103*  BUN 22 21  CREATININE 0.83 0.92  CALCIUM 10.1 9.6  ALKPHOS 41 41  AST 43* 25  ALT 16 15  ALBUMIN 3.7 3.3*   CT Abd/Pel 11/11/23 No acute intra-abdominal or intrapelvic abnormality.   MRI Brain 11/11/23 1. No acute intracranial abnormality. 2. Age-related cerebral volume  loss with moderate chronic microvascular ischemic disease.  Results/Tests Pending at Time of Discharge: T3 in process  Discharge Medications:  Allergies as of 11/12/2023   No Known Allergies      Medication List      TAKE these medications    CALTRATE 600+D PO Take 1 tablet by mouth daily.   hydrochlorothiazide 12.5 MG tablet Commonly known as: HYDRODIURIL TAKE 1 TABLET EVERY DAY What changed: when to take this   latanoprost 0.005 % ophthalmic solution Commonly known as: XALATAN Place 1 drop into both eyes at bedtime.   simvastatin 20 MG tablet Commonly known as: ZOCOR TAKE 1 TABLET EVERY EVENING        Discharge Instructions: Please refer to Patient Instructions section of EMR for full details.  Patient was counseled important signs and symptoms that should prompt return to medical care, changes in medications, dietary instructions, activity restrictions, and follow up appointments.   Follow-Up Appointments:  Future Appointments  Date Time Provider Department Center  11/14/2023  2:10 PM Lavada Porteous, [place] [facility]  12/15/2023 10:10 AM Dawn Eth Blossom Burkes, MD [facility]     Ivin Marrow, MD 11/12/2023, 5:57 PM PGY-2, [facility]

## 2023-11-12 NOTE — Progress Notes (Addendum)
 EEG complete - results pending

## 2023-11-12 NOTE — Discharge Instructions (Addendum)
Dear Amanda Shaw,  Thank you for letting us participate in your care. You were hospitalized for concern for passing out and tremors.  Your imaging and workup showed no signs of seizures or cause for you to have passed out. This is most likely related to dementia.  POST-HOSPITAL & CARE INSTRUCTIONS Please make sure to go to your follow-up appointment at our clinic to discuss next steps. Please look at the psychiatry places below to schedule an appointment.   DOCTOR'S APPOINTMENT   Future Appointments  Date Time Provider Department Center  11/12/2023  9:00 AM [facility] TECH 5 [facility] None  11/14/2023  2:10 PM Tiffany Kocher, [place] [facility]  12/15/2023 10:10 AM Pollie Meyer Estevan Ryder, MD [facility]     Take care and be well!  Family Medicine Teaching Service Inpatient Team [place]  [facility]  [address] [place], [place] [address] [phone]  Psychiatry Resource List (Adults and Children) Most of these providers will take Medicaid. please consult your insurance for a complete and updated list of available providers. When calling to make an appointment have your insurance information available to confirm you are covered.   [facility]Psychiatry and Counseling [address] [place], [place] [address] [phone]  [facility]  [address] [place], [place] [phone] Crisis [phone]   [facility]:   [facility]: [address]     [phone]   [facility]: [address] [place]. [place],        [phone] [place]: [address] [phone],    [phone] [place]: [phone] [address],                   [phone] Children: [facility] [address]         [phone]  [facility] (virtual only) [phone]   [facility]  (Psychiatry only; Adults /children 12 and over, will take  Medicaid)  [address] Laurell Josephs [address], [place], [place] [address]       [phone]   [facility] (Psychiatry & counseling ; adults & children ; will take Medicaid [address]  [place] [address]  Go on-line to complete referral ( [email or web address]://[email or web address].[email or web address]/en/make-[email or web address]-referral [phone]    (Spanish speaking therapists)  [facility]  Psychiatry & counseling; Adults and children;  Call Registration prior to scheduling an appointment [phone] [address]    [place], [place] [address]    [phone]  [facility] (Psychiatry & counseling; adults & children; Medicare no Medicaid)  [address]   [place], [place]  [address]      [phone]    [facility] (up to age 52)  Psychiatry & counseling ,will take Medicaid, must do counseling to receive psychiatry services  [address] [place] [address]        [phone]  [facility] (Psychiatry & counseling; adults & children; will take Medicaid) Will need a referral from provider [address],  [place], [place]  [phone]   [facility] --- Walk-In Mon-Friday 8am-3pm ( will take Medicaid, Psychiatry, Adults & children,  [address], [place], [place]   [phone]   [facility], Walk-in M-F 8am-12pm and 1pm -3pm   (Counseling, Psychiatry, will take Medicaid, adults & children)  [address] [place], [place]  [phone]      

## 2023-11-12 NOTE — Plan of Care (Signed)
 Messaged by RN that patient's son called requesting to speak to MD.  Patient's son stating that he is healthcare however attorney.  In like to discuss mother's case.  I called patient's son Gae Bihl who verified the patient's name and date of birth.  Landon Pinion Ferber's wife also present on the call.    They report and are concerned that a significant portion of the patient's agitation and episodes of agitation, which are the primary reason she is here at the hospital, are secondary to the care and affect of patient's other daughter [name] who is at bedside today and has been her primary caretaker.  They also are concerned that the house she is currently and is not safe.  I discussed that if they feel there is a safety concern with discharging home to the care of [name], that I can consult our social worker if need be.  They stated they would prefer that the patient be discharged into the care of her other daughter in the area, [place].  This is also a reasonable option.  I requested they fax or email the healthcare power of attorney documents to the floor.  They verbalized they would do this.  Also explained the patient has capacity to make her own decision regarding discharge plan.  I have secure chatted the patient's RN to help coordinate the above.

## 2023-11-13 LAB — T3: T3, Total: 128 ng/dL (ref 71–180)

## 2023-11-14 ENCOUNTER — Ambulatory Visit: Admitting: Student

## 2023-11-14 ENCOUNTER — Encounter: Payer: Self-pay | Admitting: Student

## 2023-11-14 ENCOUNTER — Encounter: Payer: Self-pay | Admitting: Family Medicine

## 2023-11-14 VITALS — BP 130/64 | HR 92 | Ht 61.0 in | Wt 128.4 lb

## 2023-11-14 DIAGNOSIS — R251 Tremor, unspecified: Secondary | ICD-10-CM | POA: Diagnosis not present

## 2023-11-14 DIAGNOSIS — R4189 Other symptoms and signs involving cognitive functions and awareness: Secondary | ICD-10-CM

## 2023-11-14 DIAGNOSIS — R55 Syncope and collapse: Secondary | ICD-10-CM | POA: Diagnosis not present

## 2023-11-14 NOTE — Assessment & Plan Note (Signed)
 Since resolved.  Neurology is concern for PNES, requested psychiatry evaluation outpatient.  I have sent a referral for psychiatry as well as provided resources on how to find a psychiatrist to patient and family.  Additionally, primary team requested home health order to be placed for gait training and assistance with balance. - Home health orders placed

## 2023-11-14 NOTE — Progress Notes (Signed)
    SUBJECTIVE:   CHIEF COMPLAINT / HPI:   Hospital follow-up: Syncope, AMS, Seizure-like activity Hospitalized for possible syncope versus seizure.  Discharged on 11/12/2022.  CT/MRI head were both negative for acute process.  Syncope workup including EKG, orthostatics and spot EEG was negative.  Neurology was consulted, who believed these episodes were likely PNES and recommend outpatient psychiatry follow-up and CBT stress adaptation.  Additionally, inpatient team was unable to order home health outpatient over the weekend.  They are requesting home health to be ordered today for physical therapy and Occupational Therapy.  This visit was difficult to navigate due to 3 family members (daughter, son, daughter-in-law) in the room as well as to feel members on the phone (son and daughter-in-law).  This put evident stress on the patient, and patient was talked over.  I do feel this is putting a lot of stress on the patient, I question if she would be better served with 1 family member in the room with her at time.  The daughter that patient lives with [name]) consistently interrupted other family members as well as the physician trying to speak.  I do believe she can has concerns for her mother, and is doing her best.  However per other phone numbers, patient's symptoms all started once [name] moved in with her.I cannot speak to the living arrangement, however I do have concern that patient has particular stressed with daughter living at home as evidenced by dynamic in the room.  Cognitive impairment PCP was requested to set expectations of overall prognosis of dementia by discharging hospital team. On review of chart, patient has undergone workup for reversible causes of cognitive impairment.  Workup thus far is unremarkable, and discussed these results with the family.  Family is very concerned that patient has dementia.  Discussed that anxiety and depression may also be contributing.  OBJECTIVE:    BP 130/64   Pulse 92   Ht 5\' 1"  (1.549 m)   Wt 128 lb 6.4 oz (58.2 kg)   SpO2 97%   BMI 24.26 kg/m    General: NAD, well-appearing, well-nourished Respiratory: No respiratory distress, breathing comfortably, able to speak in full sentences Skin: warm and dry, no rashes noted on exposed skin Psych: Appropriate affect and mood  ASSESSMENT/PLAN:   Assessment & Plan Syncope, unspecified syncope type Since resolved.  Neurology is concern for PNES, requested psychiatry evaluation outpatient.  I have sent a referral for psychiatry as well as provided resources on how to find a psychiatrist to patient and family.  Additionally, primary team requested home health order to be placed for gait training and assistance with balance. - Home health orders placed Cognitive impairment Differential includes dementia versus depression/anxiety (does have increased stress with daughter living at home + possible PNES). - Psychiatry as above - Discussed natural course of dementia with family.  Discussed necessity of POA, CODE STATUS, etc.   Lavada Porteous, DO [facility]

## 2023-11-14 NOTE — Patient Instructions (Addendum)
 It was great to see you! Thank you for allowing me to participate in your care!   I recommend that you always bring your medications to each appointment as this makes it easy to ensure we are on the correct medications and helps us  not miss when refills are needed.  Our plans for today:  - I have sent referral to psychiatry, I recommend looking up psychiatry resources through your insurance as well. - you can use melatonin at night for sleep - I have sent home health PT orders  Take care and seek immediate care sooner if you develop any concerns. Please remember to show up 15 minutes before your scheduled appointment time!  Lavada Porteous, DO [facility]

## 2023-11-17 ENCOUNTER — Encounter: Payer: Self-pay | Admitting: Family Medicine

## 2023-11-24 NOTE — Telephone Encounter (Signed)
 Daughter calls nurse line yesterday in regards to referrals.   Advised again on wait times for referrals.   Will forward to referral coordinator of any updates.

## 2023-11-25 ENCOUNTER — Telehealth: Payer: Self-pay

## 2023-11-25 DIAGNOSIS — R4182 Altered mental status, unspecified: Secondary | ICD-10-CM

## 2023-11-25 DIAGNOSIS — I872 Venous insufficiency (chronic) (peripheral): Secondary | ICD-10-CM

## 2023-11-25 DIAGNOSIS — R55 Syncope and collapse: Secondary | ICD-10-CM

## 2023-11-25 DIAGNOSIS — I10 Essential (primary) hypertension: Secondary | ICD-10-CM

## 2023-11-25 NOTE — Addendum Note (Signed)
 Addended by: Bevin Bucks, Veera Stapleton on: 11/25/2023 04:47 PM   Modules accepted: Orders

## 2023-11-25 NOTE — Telephone Encounter (Signed)
 Referrals placed.   Otho Blitz, MD  Family Medicine Teaching Service

## 2023-11-25 NOTE — Telephone Encounter (Signed)
 Daughter of pt is calling asking about referrals. Daughter was told when pt was in the hospital referrals for home PT,OT and Psych were being put in. I gave the daughter Almira Armour) the information for the Psych referral and would ask her mom's PCP about the other referrals.  Daughter wanted to let Dr Dawn Eth know that she was very upset with the last Dr that saw her mom. He did not go over what test were done in the hospital and what the results were. He did not want to listen to her. Daughter stated "then the Dr said well our 15 minutes are up". Daughter would like to have an appointment where she can ask questions and not be rushed. Pt has an appt on 12/15/2023. Daughter wants to know if her mom needs an extra time slot.  Daughter would like for her mom's PCP to give her a call as she does not feel like her mother is getting good care. The conversation lasted 54 minutes.  If you have questions, please let me know.   Clovis Dar

## 2023-11-28 NOTE — Telephone Encounter (Signed)
 I would be happy to schedule patient for a longer visit. Could you see if my 11:10 patient on 5/15 can reschedule to 10:10?  And then move Ms. Swallow to 11:10 for a 40 minute visit? Then I will just finish the morning with them and we can take a bit more time if needed.  Thanks! Candee Cha, MD

## 2023-11-29 NOTE — Telephone Encounter (Addendum)
 Another attempt was made by J. Fleeger and me to contact the patient. HIPAA compliant callback message left.  We called to address the patient's recent interactions with Dr. Becki Bouton, which were concerning because the patient encroached on the provider's personal space and inappropriately knocked on other patients' doors.  While the original plan was to send a MyChart message, this feedback would be more appropriately and effectively delivered either by phone or in person.  The patient has an upcoming appointment with their PCP, and it may be best to allow the PCP to address this matter during that visit. I will forward a note to PCP to address this if possible.

## 2023-11-30 DIAGNOSIS — M169 Osteoarthritis of hip, unspecified: Secondary | ICD-10-CM | POA: Diagnosis not present

## 2023-11-30 DIAGNOSIS — M48061 Spinal stenosis, lumbar region without neurogenic claudication: Secondary | ICD-10-CM | POA: Diagnosis not present

## 2023-11-30 DIAGNOSIS — E785 Hyperlipidemia, unspecified: Secondary | ICD-10-CM | POA: Diagnosis not present

## 2023-11-30 DIAGNOSIS — E041 Nontoxic single thyroid nodule: Secondary | ICD-10-CM | POA: Diagnosis not present

## 2023-11-30 DIAGNOSIS — F445 Conversion disorder with seizures or convulsions: Secondary | ICD-10-CM | POA: Diagnosis not present

## 2023-11-30 DIAGNOSIS — I10 Essential (primary) hypertension: Secondary | ICD-10-CM | POA: Diagnosis not present

## 2023-11-30 DIAGNOSIS — M810 Age-related osteoporosis without current pathological fracture: Secondary | ICD-10-CM | POA: Diagnosis not present

## 2023-11-30 DIAGNOSIS — I872 Venous insufficiency (chronic) (peripheral): Secondary | ICD-10-CM | POA: Diagnosis not present

## 2023-11-30 DIAGNOSIS — E038 Other specified hypothyroidism: Secondary | ICD-10-CM | POA: Diagnosis not present

## 2023-11-30 NOTE — Progress Notes (Signed)
 Addendum:  Patient has history of lumbar stenosis and osteoarthritis of the hip, which further limit her functional status. Patient would greatly benefit from HHPT for strength conditioning and mobility.

## 2023-12-01 ENCOUNTER — Telehealth: Payer: Self-pay

## 2023-12-01 DIAGNOSIS — R4189 Other symptoms and signs involving cognitive functions and awareness: Secondary | ICD-10-CM

## 2023-12-01 DIAGNOSIS — R251 Tremor, unspecified: Secondary | ICD-10-CM

## 2023-12-01 NOTE — Telephone Encounter (Signed)
 Patient's daughter calls nurse line requesting referral for Neurology. She states that home health PT recommended this after visit yesterday.   Advised daughter that we often need office visit to provide supporting documentation for need of referral. She has appointment with PCP on 12/15/23.  Daughter does not want to wait this long to start the process, as she knows that referrals can "take a very long time."  Advised that I would forward message to PCP and see if office note from visit with Dr. Telford Feather would be sufficient.   After lengthy phone conversation, daughter is agreeable to plan.   Elsie Halo, RN

## 2023-12-01 NOTE — Telephone Encounter (Signed)
Referral placed Amanda Rio, MD

## 2023-12-06 ENCOUNTER — Encounter: Payer: Self-pay | Admitting: Physician Assistant

## 2023-12-08 DIAGNOSIS — M810 Age-related osteoporosis without current pathological fracture: Secondary | ICD-10-CM | POA: Diagnosis not present

## 2023-12-08 DIAGNOSIS — E038 Other specified hypothyroidism: Secondary | ICD-10-CM | POA: Diagnosis not present

## 2023-12-08 DIAGNOSIS — M169 Osteoarthritis of hip, unspecified: Secondary | ICD-10-CM | POA: Diagnosis not present

## 2023-12-08 DIAGNOSIS — E041 Nontoxic single thyroid nodule: Secondary | ICD-10-CM | POA: Diagnosis not present

## 2023-12-08 DIAGNOSIS — I872 Venous insufficiency (chronic) (peripheral): Secondary | ICD-10-CM | POA: Diagnosis not present

## 2023-12-08 DIAGNOSIS — E785 Hyperlipidemia, unspecified: Secondary | ICD-10-CM | POA: Diagnosis not present

## 2023-12-08 DIAGNOSIS — I10 Essential (primary) hypertension: Secondary | ICD-10-CM | POA: Diagnosis not present

## 2023-12-08 DIAGNOSIS — M48061 Spinal stenosis, lumbar region without neurogenic claudication: Secondary | ICD-10-CM | POA: Diagnosis not present

## 2023-12-08 DIAGNOSIS — F445 Conversion disorder with seizures or convulsions: Secondary | ICD-10-CM | POA: Diagnosis not present

## 2023-12-14 DIAGNOSIS — I872 Venous insufficiency (chronic) (peripheral): Secondary | ICD-10-CM | POA: Diagnosis not present

## 2023-12-14 DIAGNOSIS — F445 Conversion disorder with seizures or convulsions: Secondary | ICD-10-CM | POA: Diagnosis not present

## 2023-12-14 DIAGNOSIS — M169 Osteoarthritis of hip, unspecified: Secondary | ICD-10-CM | POA: Diagnosis not present

## 2023-12-14 DIAGNOSIS — E785 Hyperlipidemia, unspecified: Secondary | ICD-10-CM | POA: Diagnosis not present

## 2023-12-14 DIAGNOSIS — E041 Nontoxic single thyroid nodule: Secondary | ICD-10-CM | POA: Diagnosis not present

## 2023-12-14 DIAGNOSIS — E038 Other specified hypothyroidism: Secondary | ICD-10-CM | POA: Diagnosis not present

## 2023-12-14 DIAGNOSIS — M48061 Spinal stenosis, lumbar region without neurogenic claudication: Secondary | ICD-10-CM | POA: Diagnosis not present

## 2023-12-14 DIAGNOSIS — I10 Essential (primary) hypertension: Secondary | ICD-10-CM | POA: Diagnosis not present

## 2023-12-14 DIAGNOSIS — M810 Age-related osteoporosis without current pathological fracture: Secondary | ICD-10-CM | POA: Diagnosis not present

## 2023-12-15 ENCOUNTER — Ambulatory Visit: Admitting: Behavioral Health

## 2023-12-15 ENCOUNTER — Ambulatory Visit: Admitting: Family Medicine

## 2023-12-15 ENCOUNTER — Encounter: Payer: Self-pay | Admitting: Family Medicine

## 2023-12-15 ENCOUNTER — Other Ambulatory Visit (HOSPITAL_COMMUNITY): Payer: Self-pay

## 2023-12-15 VITALS — BP 146/62 | HR 81 | Ht 61.0 in | Wt 129.4 lb

## 2023-12-15 DIAGNOSIS — F439 Reaction to severe stress, unspecified: Secondary | ICD-10-CM | POA: Diagnosis not present

## 2023-12-15 DIAGNOSIS — Z66 Do not resuscitate: Secondary | ICD-10-CM

## 2023-12-15 DIAGNOSIS — M810 Age-related osteoporosis without current pathological fracture: Secondary | ICD-10-CM | POA: Diagnosis not present

## 2023-12-15 MED ORDER — ALENDRONATE SODIUM 70 MG PO TABS
70.0000 mg | ORAL_TABLET | ORAL | 3 refills | Status: DC
  Filled 2023-12-15: qty 12, 84d supply, fill #0

## 2023-12-15 NOTE — Progress Notes (Addendum)
 Date of Visit: 12/15/2023   SUBJECTIVE:   HPI:  Amanda Shaw presents today for follow-up. Accompanying [place] today to today's visit is daughter Amanda [place] and son Amanda [place], with Amanda Shaw present on the phone from [place] .   Has had an eventful several months.  Has been seen here at the [facility] several times for stress and anxiety, and then was admitted to the [place] overnight due to concerns for seizure activity, ultimately diagnosed with PNES.  Has had significant changes in Shaw life over the last 1 to 2 years, after Shaw husband of many years died and then Shaw daughter Amanda Shaw.  That has been a stressful situation.  Took some time getting to know the family today as it is my first time meeting anyone other than Amanda Shaw.  Amanda Shaw has four adult children. In order of ages: - Amanda [place] - lives in [place], wife Amanda [place] is a retired paramedic - Amanda [place] - legal first name Amanda Shaw but goes by Amanda Shaw, currently lives with patient - Amanda [place] - lives in [place] , has POA, wife Amanda [place] is a former Nurse, learning disability. Sometimes goes by middle name Amanda [place]. Amanda [place] - lives in [place] across the street from patient  Has upcoming appointments with neurology (June 13) and psychiatry (June 18). Has been getting home health physical therapy and OT.   Patient has no physical complaints today.  She reports she is feeling well.  Shaw daughter Amanda Shaw voices concerns about patient's stress and depression.  They have noted some cognitive changes.  Interviewed patient with Amanda Shaw and Amanda [place] outside of the room.  With patient's blessing, daughter-in-law Amanda Shaw remained present in the room via phone and was able to give a lot more context.  Patient and Amanda Shaw relate that the majority of patient's issues currently stem from Amanda [place] living with [place].  Amanda Shaw moved in after patient's husband died, and that relationship has been difficult while living together. Amanda Shaw has a tendency to  bring many items into patient's home Amanda Shaw reports "hoarding") and bothers patient with Shaw concerns about patient's wellbeing. Amanda Shaw related concern over all of the items being brought into patient's home and causing a fall risk. Patient would prefer to live by herself, but no one in the family knows how to get Amanda Shaw to move out of the house. They are not interested in pursuing legal action. An assisted living facility is not an option - patient prefers to remain in Shaw home. She does report feeling safe and denies that Amanda Shaw has ever or would ever hurt Shaw. Amanda Shaw reports Amanda Shaw is ill but will not seek treatment.  This history of Amanda Shaw causing patient increased stress is consistent with history previously reported by other physicians and staff here at the Amanda [place]. Amanda Shaw has overtaken multiple prior appointments, displayed behavior that was not appropriate to the situation (grabbing a physician's arm, knocking on another patient's room door to get physician out of the room, kept staff member on phone for 1 hour perseverating on getting home health set up).   It is also consistent with my experience of the family dynamic today. Amanda Shaw had tendency to speak over others (including me) and seemed anxious and concerned for Shaw mother. Required redirecting by other family members and myself to stay on topic with the issues we were discussing.  OBJECTIVE:   BP (!) 146/62   Pulse 81   Ht 5\' 1"  (1.549 m)   Wt 129 lb 6.4 oz (58.7 kg)   SpO2 99%  BMI 24.45 kg/m  Gen: no acute distress, pleasant, cooperative HEENT: normocephalic, atraumatic  Heart: regular rate and rhythm, no murmur Lungs: clear to auscultation bilaterally, normal work of breathing Neuro: alert grossly nonfocal, speech normal. MoCA performed today, with score of 24 (including a compensatory point for not no schooling beyond 12th grade). Psych: normal range of affect, well groomed, speech normal in rate and volume,  normal eye contact    ASSESSMENT/PLAN:   Assessment & Plan Stress Seems largely stemming from difficult living situation with Shaw daughter Amanda Shaw. As patient/family not willing or able to pursue other living situations, discussed next best steps are working on skills to cope with Shaw situation. Has upcoming appointment with psychiatry, and hopefully will establish with therapist. Also has upcoming appointment with neurology. I did a MoCA today with score of 24, which is excellent for a 89 year old. Suggests against underlying dementia. I have no concerns about patient's safety or the intentions of any of Shaw children - it is clear she is very well loved by all members of Shaw family. It does seem Amanda Shaw may have Shaw own underlying mental health issues that are exacerbating difficulties for patient at home, as well as making interactions in the healthcare setting challenging.  Offered supportive words to patient and all family members present. Scheduled follow up in 1 month with me - discussed importance of regular follow up with Amanda Shaw (rather than other physicians here in our office) to facilitate rapport and continuity given complexity of situation. They were all in agreement with this strategy. Amanda Shaw will try to be present again for our next visit, which I think would be very helpful for redirecting Amanda Shaw. Osteoporosis, unspecified osteoporosis type, unspecified pathological fracture presence Reviewed results from last DEXA scan, which was obtained after holiday from bisphosphonates (completed 5 years of treatment previously). Scores slightly worse than prior to stopping bisphosphonates.  Reviewed options of resuming alendronate  vs observing, after discussion and using shared decision making patient elected to resume alendronate . Rx sent in. DNR (do not resuscitate) Discussed POA status with patient and family - Amanda Shaw has HCPOA for patient. She is able to clearly articulate in front of Shaw family  today that she would not want resuscitation in the event of cardiac arrest. They are in agreement. DNR gold form completed today and given to patient, advised to place clearly displayed on Shaw refrigerator.    FOLLOW UP: Follow up in 1 mo for above issues  [place] J. Dawn Eth, MD [facility]  Greater than 60 minutes were spent on this encounter on the day of service, including pre-visit planning, actual face to face time, coordination of care, and documentation of visit.

## 2023-12-15 NOTE — Patient Instructions (Addendum)
 It was great to see you again today.  Sent in medication for your bone strength Filled out DNR form today Follow up with me as scheduled.  Be well, Dr. Dawn Eth

## 2023-12-16 ENCOUNTER — Telehealth: Payer: Self-pay

## 2023-12-16 ENCOUNTER — Other Ambulatory Visit (HOSPITAL_COMMUNITY): Payer: Self-pay

## 2023-12-16 NOTE — Telephone Encounter (Signed)
Opened in error.   Ed Mandich C Nyellie Yetter, RN  

## 2023-12-17 DIAGNOSIS — F439 Reaction to severe stress, unspecified: Secondary | ICD-10-CM | POA: Insufficient documentation

## 2023-12-17 NOTE — Assessment & Plan Note (Signed)
 Seems largely stemming from difficult living situation with her daughter [name]. As patient/family not willing or able to pursue other living situations, discussed next best steps are working on skills to cope with her situation. Has upcoming appointment with psychiatry, and hopefully will establish with therapist. Also has upcoming appointment with neurology. I did a MoCA today with score of 24, which is excellent for a 89 year old. Suggests against underlying dementia. I have no concerns about patient's safety or the intentions of any of her children - it is clear she is very well loved by all members of her family. It does seem [name] may have her own underlying mental health issues that are exacerbating difficulties for patient at home, as well as making interactions in the healthcare setting challenging.  Offered supportive words to patient and all family members present. Scheduled follow up in 1 month with me - discussed importance of regular follow up with PCP (rather than other physicians here in our office) to facilitate rapport and continuity given complexity of situation. They were all in agreement with this strategy. [name] will try to be present again for our next visit, which I think would be very helpful for redirecting [name].

## 2023-12-17 NOTE — Assessment & Plan Note (Signed)
 Reviewed results from last DEXA scan, which was obtained after holiday from bisphosphonates (completed 5 years of treatment previously). Scores slightly worse than prior to stopping bisphosphonates.  Reviewed options of resuming alendronate  vs observing, after discussion and using shared decision making patient elected to resume alendronate . Rx sent in.

## 2023-12-18 DIAGNOSIS — Z66 Do not resuscitate: Secondary | ICD-10-CM | POA: Insufficient documentation

## 2023-12-18 NOTE — Assessment & Plan Note (Signed)
 Discussed POA status with patient and family - [name] has HCPOA for patient. She is able to clearly articulate in front of her family today that she would not want resuscitation in the event of cardiac arrest. They are in agreement. DNR gold form completed today and given to patient, advised to place clearly displayed on her refrigerator.

## 2023-12-21 DIAGNOSIS — E041 Nontoxic single thyroid nodule: Secondary | ICD-10-CM | POA: Diagnosis not present

## 2023-12-21 DIAGNOSIS — M169 Osteoarthritis of hip, unspecified: Secondary | ICD-10-CM | POA: Diagnosis not present

## 2023-12-21 DIAGNOSIS — I10 Essential (primary) hypertension: Secondary | ICD-10-CM | POA: Diagnosis not present

## 2023-12-21 DIAGNOSIS — E785 Hyperlipidemia, unspecified: Secondary | ICD-10-CM | POA: Diagnosis not present

## 2023-12-21 DIAGNOSIS — F445 Conversion disorder with seizures or convulsions: Secondary | ICD-10-CM | POA: Diagnosis not present

## 2023-12-21 DIAGNOSIS — M810 Age-related osteoporosis without current pathological fracture: Secondary | ICD-10-CM | POA: Diagnosis not present

## 2023-12-21 DIAGNOSIS — M48061 Spinal stenosis, lumbar region without neurogenic claudication: Secondary | ICD-10-CM | POA: Diagnosis not present

## 2023-12-21 DIAGNOSIS — E038 Other specified hypothyroidism: Secondary | ICD-10-CM | POA: Diagnosis not present

## 2023-12-21 DIAGNOSIS — I872 Venous insufficiency (chronic) (peripheral): Secondary | ICD-10-CM | POA: Diagnosis not present

## 2023-12-22 ENCOUNTER — Ambulatory Visit (INDEPENDENT_AMBULATORY_CARE_PROVIDER_SITE_OTHER): Admitting: Physician Assistant

## 2023-12-22 ENCOUNTER — Encounter

## 2023-12-22 ENCOUNTER — Encounter: Payer: Self-pay | Admitting: Physician Assistant

## 2023-12-22 VITALS — BP 156/72 | Resp 20 | Wt 131.0 lb

## 2023-12-22 DIAGNOSIS — R413 Other amnesia: Secondary | ICD-10-CM

## 2023-12-22 NOTE — Progress Notes (Cosign Needed Addendum)
 Assessment/Plan:     Amanda Shaw is a very pleasant 89 y.o. year old RH female with a history of hypertension, hyperlipidemia, glaucoma, spinal stenosis, recent PNES (psychogenic non epileptic seizure)  seen today for evaluation of memory loss. MoCA unable to be tested due to decreased comprehension, but able to perform MMSE, 28/30. Most recent MRI brain 11/2023, personally reviewed was remarkable for moderate atrophy, age-related involutional changes on the ventricle, foci and cisterns, and mild chronic small vessel ischemic changes. No acute findings. Etiology is unclear. She is able to participate on her ADLs, no longer drives. Suspect a component of situational depression and high stress regarding her living situation as her daughter has moved with her, no longer driving, and losing her independence. She has an appointment with psychiatry and psychology soon.   Memory Impairment of unclear etiology, likely multifactorial    Recommend good control of cardiovascular risk factors.   Start donepezil 5 mg daily side effects discussed  Agree with psychiatry and counseling for situational depression and anxiety Continue to control mood as per PCP [place] up in 6 months   Subjective:    The patient is accompanied by her daughter who supplements  the history.    How long did patient have memory difficulties?  Patient denies, daughter reports that her memory issues are present for about a "few months"  Patient reports some difficulty remembering new information, recent conversations, names "but is not so bad". repeats oneself?  Endorsed Disoriented when walking into a room? Denies    Leaving objects in unusual places? Today she had lost the wallet and found it  in the coat pocket  Wandering behavior? Denies.   Any personality changes, or depression, anxiety?  Endorsed, after the death of her husband, which brought significant stress and depression, then daughter moving in which brought  adjustments many issues.  She has an appointment with psychiatry on June 18.  Hallucinations or paranoia? Denies.   Seizures? She had an episode of "shaking  with arms flailing slapping herself in the face",  but workup included EEG with negative results. Abnormal movements were felt to be psychogenic.   Any sleep changes?  Sleeps well, Denies frequent nightmares or dream reenactment, other REM behavior or sleepwalking   Sleep apnea? Denies.   Any hygiene concerns?  Denies.   Independent of bathing and dressing? Endorsed  Does the patient need help with medications?  Patient is in charge   Who is in charge of the finances? Daughter is in charge. She was trying to buy stuff from [facility] and was spending too much money on it    Any changes in appetite?   Denies.     Patient have trouble swallowing?  Denies.   Does the patient cook? No, denies forgetting common recipes or kitchen accidents   Any headaches?  Denies.   Chronic pain? Denies.   Ambulates with difficulty? "Needs a cane but not using it"   She is getting physical therapy and OT. Recent falls or head injuries? Recently she fell on a dog hole on the R elbow  no head injury , no LOC  Vision changes?  Denies any new issues.  Any strokelike symptoms? Denies.   Any tremors? Denies.   Any anosmia? Denies.   Any incontinence of urine? "A little bit"  Any bowel dysfunction? Denies.      Daughter moved in with her after her husband died, and the relationship has been very difficult is requiring adjustment, "no  one in the family knows how to get Amanda Shaw to move out of the house, she reports that, not interested in pursuing legal action " per chart notes.   History of heavy alcohol intake? Denies.   History of heavy tobacco use? Denies.   Family history of dementia? Denies  Does patient drive? No longer drive, daughter took the keys away, patient feel that her independence has been taken from her..   No Known Allergies  Current Outpatient  Medications  Medication Instructions   alendronate  (FOSAMAX ) 70 MG tablet Take 1 tablet (70 mg total) by mouth every 7 (seven) days with a full glass of water on an empty stomach.   Calcium Carbonate-Vitamin D  (CALTRATE 600+D PO) 1 tablet, Daily   hydrochlorothiazide  (HYDRODIURIL ) 12.5 MG tablet TAKE 1 TABLET EVERY DAY   latanoprost (XALATAN) 0.005 % ophthalmic solution 1 drop, Daily at bedtime   melatonin 3 mg, Daily at bedtime   simvastatin  (ZOCOR ) 20 mg, Oral, Every evening     VITALS:   Vitals:   12/22/23 1444  BP: (!) 156/72  Resp: 20  SpO2: 98%  Weight: 131 lb (59.4 kg)      PHYSICAL EXAM   HEENT:  Normocephalic, atraumatic.  The superficial temporal arteries are without ropiness or tenderness. Cardiovascular: Regular rate and rhythm. Lungs: Clear to auscultation bilaterally. Neck: There are no carotid bruits noted bilaterally.  NEUROLOGICAL:     No data to display             12/22/2023    5:00 PM 11/22/2013   12:00 PM 12/21/2011    3:00 PM  MMSE - Mini Mental State Exam  Orientation to time 5 5 5   Orientation to Place 5 5 5   Registration 3 3 3   Attention/ Calculation 5 5 5   Recall 3 3 3   Language- name 2 objects 2 2 2   Language- repeat 0 1 1  Language- follow 3 step command 3 3 3   Language- read & follow direction 0 1 1  Write a sentence 1 1 1   Copy design 1 1 1   Total score 28 30 30      Orientation:  Alert and oriented to person, place and time. No aphasia or dysarthria. Fund of knowledge is appropriate. Recent memory impaired, remote memory normal. Attention and concentration are normal .  Able to name objects and repeat phrases.  Delayed recall 3/3 Cranial nerves: There is good facial symmetry. Extraocular muscles are intact and visual fields are full to confrontational testing. Speech is fluent and clear. No tongue deviation. Hearing is mildly decreased to conversational tone.  Tone: Tone is good throughout. Sensation: Sensation is intact to light  touch.  Vibration is intact at the bilateral big toe.  Coordination: The patient has no difficulty with RAM's or FNF bilaterally. Normal finger to nose  Motor: Strength is 5/5 in the bilateral upper and lower extremities. There is no pronator drift. There are no fasciculations noted. DTR's: Deep tendon reflexes are 2/4 bilaterally. Gait and Station: The patient is able to ambulate without difficulty. Gait is cautious and narrow. Stride length is normal, patient flexes forward         Thank you for allowing us  the opportunity to participate in the care of this nice patient. Please do not hesitate to contact us  for any questions or concerns.   Total time spent on today's visit was 40 minutes dedicated to this patient today, preparing to see patient, examining the patient, ordering tests and/or medications and  counseling the patient, documenting clinical information in the EHR or other health record, independently interpreting results and communicating results to the patient/family, discussing treatment and goals, answering patient's questions and coordinating care.  Cc:  Candee Cha, MD  Tex Filbert 12/22/2023 3:22 PM

## 2023-12-22 NOTE — Patient Instructions (Signed)
 It was a pleasure to see you today at our office.   Recommendations:          Start donepezil 5 mg daily  Follow up in 6 months Recommend visiting the website : " Dementia Success Path" to better understand some behaviors related to memory loss.  For psychiatric meds, mood meds: Please have your primary care physician manage these medications.  If you have any severe symptoms of a stroke, or other severe issues such as confusion,severe chills or fever, etc call 911 or go to the ER as you may need to be evaluated further For guidance regarding [facility] and if placement were needed at the facility, contact Social Worker tel: [phone]  For assessment of decision of mental capacity and competency:  Call Dr. Laverne Potter, geriatric psychiatrist at [phone] Counseling regarding caregiver distress, including caregiver depression, anxiety and issues regarding community resources, adult day care programs, adult living facilities, or memory care questions:  please contact your  Primary Doctor's Social Worker   FOR Memory  decline, memory medications: Call our office [phone]    [email or web address]   RECOMMENDATIONS FOR ALL PATIENTS WITH MEMORY PROBLEMS: 1. Continue to exercise (Recommend 30 minutes of walking everyday, or 3 hours every week) 2. Increase social interactions - continue going to [place] and enjoy social gatherings with friends and family 3. Eat healthy, avoid fried foods and eat more fruits and vegetables 4. Maintain adequate blood pressure, blood sugar, and blood cholesterol level. Reducing the risk of stroke and cardiovascular disease also helps promoting better memory. 5. Avoid stressful situations. Live a simple life and avoid aggravations. Organize your time and prepare for the next day in anticipation. 6. Sleep well, avoid any interruptions of sleep and avoid any distractions in the bedroom  that may interfere with adequate sleep quality 7. Avoid sugar, avoid sweets as there is a strong link between excessive sugar intake, diabetes, and cognitive impairment We discussed the Mediterranean diet, which has been shown to help patients reduce the risk of progressive memory disorders and reduces cardiovascular risk. This includes eating fish, eat fruits and green leafy vegetables, nuts like almonds and hazelnuts, walnuts, and also use olive oil. Avoid fast foods and fried foods as much as possible. Avoid sweets and sugar as sugar use has been linked to worsening of memory function.  There is always a concern of gradual progression of memory problems. If this is the case, then we may need to adjust level of care according to patient needs. Support, both to the patient and caregiver, should then be put into place.        DRIVING: Regarding driving, in patients with progressive memory problems, driving will be impaired. We advise to have someone else do the driving if trouble finding directions or if minor accidents are reported. Independent driving assessment is available to determine safety of driving.   If you are interested in the driving assessment, you can contact the following:  [facility] [employer] [facility] in [place] [phone]  [facility] [phone]  [facility] [phone]  [facility] [phone] or [phone]   FALL PRECAUTIONS: Be cautious when walking. Scan the area for obstacles that may increase the risk of trips and falls. When getting up in the mornings, sit up at the edge of the bed for a few minutes before getting out of bed. Consider elevating the bed at the head end to avoid drop of blood pressure when getting up. Walk always in a well-lit  room (use night lights in the walls). Avoid area rugs or power cords from appliances in the middle of the walkways. Use a walker or a cane if necessary and consider physical therapy for  balance exercise. Get your eyesight checked regularly.  FINANCIAL OVERSIGHT: Supervision, especially oversight when making financial decisions or transactions is also recommended.  HOME SAFETY: Consider the safety of the kitchen when operating appliances like stoves, microwave oven, and blender. Consider having supervision and share cooking responsibilities until no longer able to participate in those. Accidents with firearms and other hazards in the house should be identified and addressed as well.   ABILITY TO BE LEFT ALONE: If patient is unable to contact 911 operator, consider using LifeLine, or when the need is there, arrange for someone to stay with patients. Smoking is a fire hazard, consider supervision or cessation. Risk of wandering should be assessed by caregiver and if detected at any point, supervision and safe proof recommendations should be instituted.  MEDICATION SUPERVISION: Inability to self-administer medication needs to be constantly addressed. Implement a mechanism to ensure safe administration of the medications.      Mediterranean Diet A Mediterranean diet refers to food and lifestyle choices that are based on the traditions of countries located on the [employer]. This way of eating has been shown to help prevent certain conditions and improve outcomes for people who have chronic diseases, like kidney disease and heart disease. What are tips for following this plan? Lifestyle  Cook and eat meals together with your family, when possible. Drink enough fluid to keep your urine clear or pale yellow. Be physically active every day. This includes: Aerobic exercise like running or swimming. Leisure activities like gardening, walking, or housework. Get 7-8 hours of sleep each night. If recommended by your health care provider, drink red wine in moderation. This means 1 glass a day for nonpregnant women and 2 glasses a day for men. A glass of wine equals 5 oz (150  mL). Reading food labels  Check the serving size of packaged foods. For foods such as rice and pasta, the serving size refers to the amount of cooked product, not dry. Check the total fat in packaged foods. Avoid foods that have saturated fat or trans fats. Check the ingredients list for added sugars, such as corn syrup. Shopping  At the grocery store, buy most of your food from the areas near the walls of the store. This includes: Fresh fruits and vegetables (produce). Grains, beans, nuts, and seeds. Some of these may be available in unpackaged forms or large amounts (in bulk). Fresh seafood. Poultry and eggs. Low-fat dairy products. Buy whole ingredients instead of prepackaged foods. Buy fresh fruits and vegetables in-season from local farmers markets. Buy frozen fruits and vegetables in resealable bags. If you do not have access to quality fresh seafood, buy precooked frozen shrimp or canned fish, such as tuna, salmon, or sardines. Buy small amounts of raw or cooked vegetables, salads, or olives from the deli or salad bar at your store. Stock your pantry so you always have certain foods on hand, such as olive oil, canned tuna, canned tomatoes, rice, pasta, and beans. Cooking  Cook foods with extra-virgin olive oil instead of using butter or other vegetable oils. Have meat as a side dish, and have vegetables or grains as your main dish. This means having meat in small portions or adding small amounts of meat to foods like pasta or stew. Use beans or vegetables instead of meat  in common dishes like chili or lasagna. Experiment with different cooking methods. Try roasting or broiling vegetables instead of steaming or sauteing them. Add frozen vegetables to soups, stews, pasta, or rice. Add nuts or seeds for added healthy fat at each meal. You can add these to yogurt, salads, or vegetable dishes. Marinate fish or vegetables using olive oil, lemon juice, garlic, and fresh herbs. Meal  planning  Plan to eat 1 vegetarian meal one day each week. Try to work up to 2 vegetarian meals, if possible. Eat seafood 2 or more times a week. Have healthy snacks readily available, such as: Vegetable sticks with hummus. Greek yogurt. Fruit and nut trail mix. Eat balanced meals throughout the week. This includes: Fruit: 2-3 servings a day Vegetables: 4-5 servings a day Low-fat dairy: 2 servings a day Fish, poultry, or lean meat: 1 serving a day Beans and legumes: 2 or more servings a week Nuts and seeds: 1-2 servings a day Whole grains: 6-8 servings a day Extra-virgin olive oil: 3-4 servings a day Limit red meat and sweets to only a few servings a month What are my food choices? Mediterranean diet Recommended Grains: Whole-grain pasta. Brown rice. Bulgar wheat. Polenta. Couscous. Whole-wheat bread. Dwyane Glad. Vegetables: Artichokes. Beets. Broccoli. Cabbage. Carrots. Eggplant. Green beans. Chard. Kale. Spinach. Onions. Leeks. Peas. Squash. Tomatoes. Peppers. Radishes. Fruits: Apples. Apricots. Avocado. Berries. Bananas. Cherries. Dates. Figs. Grapes. Lemons. Melon. Oranges. Peaches. Plums. Pomegranate. Meats and other protein foods: Beans. Almonds. Sunflower seeds. Pine nuts. Peanuts. Cod. Salmon. Scallops. Shrimp. Tuna. Tilapia. Clams. Oysters. Eggs. Dairy: Low-fat milk. Cheese. Greek yogurt. Beverages: Water. Red wine. Herbal tea. Fats and oils: Extra virgin olive oil. Avocado oil. Grape seed oil. Sweets and desserts: [place] yogurt with honey. Baked apples. Poached pears. Trail mix. Seasoning and other foods: Basil. Cilantro. Coriander. Cumin. Mint. Parsley. Sage. Rosemary. Tarragon. Garlic. Oregano. Thyme. Pepper. Balsalmic vinegar. Tahini. Hummus. Tomato sauce. Olives. Mushrooms. Limit these Grains: Prepackaged pasta or rice dishes. Prepackaged cereal with added sugar. Vegetables: Deep fried potatoes (french fries). Fruits: Fruit canned in syrup. Meats and other protein  foods: Beef. Pork. Lamb. Poultry with skin. Hot dogs. Helene Loader. Dairy: Ice cream. Sour cream. Whole milk. Beverages: Juice. Sugar-sweetened soft drinks. Beer. Liquor and spirits. Fats and oils: Butter. Canola oil. Vegetable oil. Beef fat (tallow). Lard. Sweets and desserts: Cookies. Cakes. Pies. Candy. Seasoning and other foods: Mayonnaise. Premade sauces and marinades. The items listed may not be a complete list. Talk with your dietitian about what dietary choices are right for you. Summary The Mediterranean diet includes both food and lifestyle choices. Eat a variety of fresh fruits and vegetables, beans, nuts, seeds, and whole grains. Limit the amount of red meat and sweets that you eat. Talk with your health care provider about whether it is safe for you to drink red wine in moderation. This means 1 glass a day for nonpregnant women and 2 glasses a day for men. A glass of wine equals 5 oz (150 mL). This information is not intended to replace advice given to you by your health care provider. Make sure you discuss any questions you have with your health care provider. Document Released: 03/11/2016 Document Revised: 04/13/2016 Document Reviewed: 03/11/2016 Elsevier Interactive Patient Education  2017 [employer].

## 2023-12-23 ENCOUNTER — Other Ambulatory Visit (HOSPITAL_COMMUNITY): Payer: Self-pay

## 2023-12-23 ENCOUNTER — Other Ambulatory Visit: Payer: Self-pay

## 2023-12-23 MED ORDER — DONEPEZIL HCL 5 MG PO TABS
5.0000 mg | ORAL_TABLET | Freq: Every day | ORAL | 1 refills | Status: DC
  Filled 2023-12-23: qty 90, 90d supply, fill #0

## 2023-12-27 DIAGNOSIS — E041 Nontoxic single thyroid nodule: Secondary | ICD-10-CM | POA: Diagnosis not present

## 2023-12-27 DIAGNOSIS — M169 Osteoarthritis of hip, unspecified: Secondary | ICD-10-CM | POA: Diagnosis not present

## 2023-12-27 DIAGNOSIS — E785 Hyperlipidemia, unspecified: Secondary | ICD-10-CM | POA: Diagnosis not present

## 2023-12-27 DIAGNOSIS — F445 Conversion disorder with seizures or convulsions: Secondary | ICD-10-CM | POA: Diagnosis not present

## 2023-12-27 DIAGNOSIS — E038 Other specified hypothyroidism: Secondary | ICD-10-CM | POA: Diagnosis not present

## 2023-12-27 DIAGNOSIS — M48061 Spinal stenosis, lumbar region without neurogenic claudication: Secondary | ICD-10-CM | POA: Diagnosis not present

## 2023-12-27 DIAGNOSIS — I872 Venous insufficiency (chronic) (peripheral): Secondary | ICD-10-CM | POA: Diagnosis not present

## 2023-12-27 DIAGNOSIS — I10 Essential (primary) hypertension: Secondary | ICD-10-CM | POA: Diagnosis not present

## 2023-12-27 DIAGNOSIS — M810 Age-related osteoporosis without current pathological fracture: Secondary | ICD-10-CM | POA: Diagnosis not present

## 2023-12-30 DIAGNOSIS — E041 Nontoxic single thyroid nodule: Secondary | ICD-10-CM | POA: Diagnosis not present

## 2023-12-30 DIAGNOSIS — M169 Osteoarthritis of hip, unspecified: Secondary | ICD-10-CM | POA: Diagnosis not present

## 2023-12-30 DIAGNOSIS — I10 Essential (primary) hypertension: Secondary | ICD-10-CM | POA: Diagnosis not present

## 2023-12-30 DIAGNOSIS — I872 Venous insufficiency (chronic) (peripheral): Secondary | ICD-10-CM | POA: Diagnosis not present

## 2023-12-30 DIAGNOSIS — F445 Conversion disorder with seizures or convulsions: Secondary | ICD-10-CM | POA: Diagnosis not present

## 2023-12-30 DIAGNOSIS — M48061 Spinal stenosis, lumbar region without neurogenic claudication: Secondary | ICD-10-CM | POA: Diagnosis not present

## 2023-12-30 DIAGNOSIS — E038 Other specified hypothyroidism: Secondary | ICD-10-CM | POA: Diagnosis not present

## 2023-12-30 DIAGNOSIS — E785 Hyperlipidemia, unspecified: Secondary | ICD-10-CM | POA: Diagnosis not present

## 2023-12-30 DIAGNOSIS — M810 Age-related osteoporosis without current pathological fracture: Secondary | ICD-10-CM | POA: Diagnosis not present

## 2024-01-05 DIAGNOSIS — M169 Osteoarthritis of hip, unspecified: Secondary | ICD-10-CM | POA: Diagnosis not present

## 2024-01-05 DIAGNOSIS — I872 Venous insufficiency (chronic) (peripheral): Secondary | ICD-10-CM | POA: Diagnosis not present

## 2024-01-05 DIAGNOSIS — M48061 Spinal stenosis, lumbar region without neurogenic claudication: Secondary | ICD-10-CM | POA: Diagnosis not present

## 2024-01-05 DIAGNOSIS — E785 Hyperlipidemia, unspecified: Secondary | ICD-10-CM | POA: Diagnosis not present

## 2024-01-05 DIAGNOSIS — E041 Nontoxic single thyroid nodule: Secondary | ICD-10-CM | POA: Diagnosis not present

## 2024-01-05 DIAGNOSIS — I10 Essential (primary) hypertension: Secondary | ICD-10-CM | POA: Diagnosis not present

## 2024-01-05 DIAGNOSIS — M810 Age-related osteoporosis without current pathological fracture: Secondary | ICD-10-CM | POA: Diagnosis not present

## 2024-01-05 DIAGNOSIS — E038 Other specified hypothyroidism: Secondary | ICD-10-CM | POA: Diagnosis not present

## 2024-01-05 DIAGNOSIS — F445 Conversion disorder with seizures or convulsions: Secondary | ICD-10-CM | POA: Diagnosis not present

## 2024-01-09 DIAGNOSIS — M169 Osteoarthritis of hip, unspecified: Secondary | ICD-10-CM | POA: Diagnosis not present

## 2024-01-09 DIAGNOSIS — E038 Other specified hypothyroidism: Secondary | ICD-10-CM | POA: Diagnosis not present

## 2024-01-09 DIAGNOSIS — E041 Nontoxic single thyroid nodule: Secondary | ICD-10-CM | POA: Diagnosis not present

## 2024-01-09 DIAGNOSIS — I872 Venous insufficiency (chronic) (peripheral): Secondary | ICD-10-CM | POA: Diagnosis not present

## 2024-01-09 DIAGNOSIS — F445 Conversion disorder with seizures or convulsions: Secondary | ICD-10-CM | POA: Diagnosis not present

## 2024-01-09 DIAGNOSIS — E785 Hyperlipidemia, unspecified: Secondary | ICD-10-CM | POA: Diagnosis not present

## 2024-01-09 DIAGNOSIS — M810 Age-related osteoporosis without current pathological fracture: Secondary | ICD-10-CM | POA: Diagnosis not present

## 2024-01-09 DIAGNOSIS — I10 Essential (primary) hypertension: Secondary | ICD-10-CM | POA: Diagnosis not present

## 2024-01-09 DIAGNOSIS — M48061 Spinal stenosis, lumbar region without neurogenic claudication: Secondary | ICD-10-CM | POA: Diagnosis not present

## 2024-01-13 ENCOUNTER — Encounter

## 2024-01-13 ENCOUNTER — Encounter: Payer: Self-pay | Admitting: Physician Assistant

## 2024-01-16 DIAGNOSIS — I872 Venous insufficiency (chronic) (peripheral): Secondary | ICD-10-CM | POA: Diagnosis not present

## 2024-01-16 DIAGNOSIS — E041 Nontoxic single thyroid nodule: Secondary | ICD-10-CM | POA: Diagnosis not present

## 2024-01-16 DIAGNOSIS — M810 Age-related osteoporosis without current pathological fracture: Secondary | ICD-10-CM | POA: Diagnosis not present

## 2024-01-16 DIAGNOSIS — E785 Hyperlipidemia, unspecified: Secondary | ICD-10-CM | POA: Diagnosis not present

## 2024-01-16 DIAGNOSIS — M48061 Spinal stenosis, lumbar region without neurogenic claudication: Secondary | ICD-10-CM | POA: Diagnosis not present

## 2024-01-16 DIAGNOSIS — M169 Osteoarthritis of hip, unspecified: Secondary | ICD-10-CM | POA: Diagnosis not present

## 2024-01-16 DIAGNOSIS — E038 Other specified hypothyroidism: Secondary | ICD-10-CM | POA: Diagnosis not present

## 2024-01-16 DIAGNOSIS — I10 Essential (primary) hypertension: Secondary | ICD-10-CM | POA: Diagnosis not present

## 2024-01-16 DIAGNOSIS — F445 Conversion disorder with seizures or convulsions: Secondary | ICD-10-CM | POA: Diagnosis not present

## 2024-01-18 ENCOUNTER — Encounter: Payer: Self-pay | Admitting: Behavioral Health

## 2024-01-18 ENCOUNTER — Ambulatory Visit: Admitting: Behavioral Health

## 2024-01-18 DIAGNOSIS — Z0389 Encounter for observation for other suspected diseases and conditions ruled out: Secondary | ICD-10-CM

## 2024-01-18 NOTE — Progress Notes (Signed)
 Pt and daughter were looking for psychotherapy. Does not want to put patient on more medication.I explained my role and they were under the impression of more talk therapy.   Understands that CBT at age of 34 would most likely not be beneficial.  To continue treatment with neurology and medication appropriate for dementia and cognitive changes for now. Patient will seek out Geriatric Provider and memory care resources. As previously instructed. No charge this visit. Will not be accepting as new patient at this time.

## 2024-01-19 ENCOUNTER — Ambulatory Visit (INDEPENDENT_AMBULATORY_CARE_PROVIDER_SITE_OTHER): Admitting: Family Medicine

## 2024-01-19 ENCOUNTER — Other Ambulatory Visit (HOSPITAL_COMMUNITY): Payer: Self-pay

## 2024-01-19 VITALS — BP 139/65 | HR 71 | Wt 128.0 lb

## 2024-01-19 DIAGNOSIS — M810 Age-related osteoporosis without current pathological fracture: Secondary | ICD-10-CM | POA: Diagnosis not present

## 2024-01-19 DIAGNOSIS — F439 Reaction to severe stress, unspecified: Secondary | ICD-10-CM | POA: Diagnosis not present

## 2024-01-19 NOTE — Progress Notes (Signed)
  Date of Visit: 01/19/2024   SUBJECTIVE:   HPI:  Altheria presents today for follow up. She is accompanied today by her son Marcey and daughter Jamee.  Stress - recently had visit with psych NP but they ended up canceling the visit as NP was only going to offer medication therapies, and patient is more interested in talk therapy. NP recommended she see a therapist who specializes in geriatric issues. Patient and her daughter Jamee would like to go to therapy together to work on their communication.  Patient interviewed alone and reports she overall feels she is doing okay, still just struggles with her daughter Jamee, who lives with her. She feels safe at home. Reports Jamee has hoarding behaviors and has brought many items into the home, and is continuing to bring them in. Patient does feel safe and thinks she can get around okay without falling in her home.   Saw neurology PA recently who prescribed aricept  5mg  daily. Has been having some diarrhea, thinks related to this. Asks if she can break into smaller dose.  Osteoporosis - resumed alendronate , tolerating fine, taking correctly  OBJECTIVE:   BP 139/65   Pulse 71   Wt 128 lb (58.1 kg)   SpO2 97%   BMI 24.19 kg/m  Gen: no acute distress, pleasant, cooperative HEENT: normocephalic, atraumatic  Heart: regular rate and rhythm, no murmur Lungs: clear to auscultation bilaterally, normal work of breathing  Neuro: alert grossly nonfocal speech normal   ASSESSMENT/PLAN:   Assessment & Plan Stress -Stress continues over relationship with live-in daughter Jamee -Was able to talk with patient alone today, and then briefly with her and her son Marcey without Jamee present. Advised that if Yvonne's hoarding behaviors worsen to the point that patient is unsafe at home, I will be mandated to report to Adult Protective Services. They were understanding of this. -Reviewed options for therapists, encouraged them to look at  psychologytoday.[email or web address] to find a therapist they think will be a good match. Discussed that it may take time to find a therapist and get in, and that if it is not a good fit then they can look for another one. -Recommend they reach out to pharmacist regarding whether aricept  can be broken in half, as I was not able to easily find this answer today -In the meantime they will follow up with me in 6 months, sooner if needed. -All office visits with me for Ms. Plott should be scheduled as my last patient of the session and for 40 minutes in duration. Osteoporosis, unspecified osteoporosis type, unspecified pathological fracture presence Doing well on alendronate , continue    FOLLOW UP: Follow up in 6 mos for above issues  [place] J. Donah, MD [facility]

## 2024-01-19 NOTE — Patient Instructions (Signed)
 It was great to see you again today.  Come back in 6 months It is working well to schedule an 11:10 appointment that is 40 minutes long  Be well, Dr. Dawn Eth

## 2024-01-21 NOTE — Assessment & Plan Note (Signed)
-  Stress continues over relationship with live-in daughter Amanda Shaw -Was able to talk with patient alone today, and then briefly with her and her son Amanda Shaw without Amanda Shaw present. Advised that if Amanda Shaw's hoarding behaviors worsen to the point that patient is unsafe at home, I will be mandated to report to Adult Protective Services. They were understanding of this. -Reviewed options for therapists, encouraged them to look at psychologytoday.[email or web address] to find a therapist they think will be a good match. Discussed that it may take time to find a therapist and get in, and that if it is not a good fit then they can look for another one. -Recommend they reach out to pharmacist regarding whether aricept  can be broken in half, as I was not able to easily find this answer today -In the meantime they will follow up with me in 6 months, sooner if needed. -All office visits with me for Amanda Shaw should be scheduled as my last patient of the session and for 40 minutes in duration.

## 2024-01-21 NOTE — Assessment & Plan Note (Signed)
 Doing well on alendronate , continue

## 2024-01-25 DIAGNOSIS — M810 Age-related osteoporosis without current pathological fracture: Secondary | ICD-10-CM | POA: Diagnosis not present

## 2024-01-25 DIAGNOSIS — E041 Nontoxic single thyroid nodule: Secondary | ICD-10-CM | POA: Diagnosis not present

## 2024-01-25 DIAGNOSIS — M169 Osteoarthritis of hip, unspecified: Secondary | ICD-10-CM | POA: Diagnosis not present

## 2024-01-25 DIAGNOSIS — M48061 Spinal stenosis, lumbar region without neurogenic claudication: Secondary | ICD-10-CM | POA: Diagnosis not present

## 2024-01-25 DIAGNOSIS — I872 Venous insufficiency (chronic) (peripheral): Secondary | ICD-10-CM | POA: Diagnosis not present

## 2024-01-25 DIAGNOSIS — E038 Other specified hypothyroidism: Secondary | ICD-10-CM | POA: Diagnosis not present

## 2024-01-25 DIAGNOSIS — I10 Essential (primary) hypertension: Secondary | ICD-10-CM | POA: Diagnosis not present

## 2024-01-25 DIAGNOSIS — F445 Conversion disorder with seizures or convulsions: Secondary | ICD-10-CM | POA: Diagnosis not present

## 2024-01-25 DIAGNOSIS — E785 Hyperlipidemia, unspecified: Secondary | ICD-10-CM | POA: Diagnosis not present

## 2024-02-01 DIAGNOSIS — H40053 Ocular hypertension, bilateral: Secondary | ICD-10-CM | POA: Diagnosis not present

## 2024-02-14 ENCOUNTER — Other Ambulatory Visit: Payer: Self-pay

## 2024-02-14 ENCOUNTER — Other Ambulatory Visit: Payer: Self-pay | Admitting: *Deleted

## 2024-02-14 MED ORDER — DONEPEZIL HCL 5 MG PO TABS: 5.0000 mg | ORAL_TABLET | Freq: Every day | ORAL | 1 refills | Status: DC

## 2024-02-14 MED ORDER — ALENDRONATE SODIUM 70 MG PO TABS: 70.0000 mg | ORAL_TABLET | ORAL | 3 refills | Status: DC

## 2024-02-29 ENCOUNTER — Encounter: Payer: Self-pay | Admitting: Physician Assistant

## 2024-02-29 ENCOUNTER — Encounter

## 2024-04-30 ENCOUNTER — Emergency Department (HOSPITAL_COMMUNITY)

## 2024-04-30 ENCOUNTER — Inpatient Hospital Stay (HOSPITAL_COMMUNITY)
Admission: EM | Admit: 2024-04-30 | Discharge: 2024-06-02 | DRG: 884 | Disposition: E | Attending: Family Medicine | Admitting: Family Medicine

## 2024-04-30 ENCOUNTER — Encounter: Payer: Self-pay | Admitting: Student

## 2024-04-30 ENCOUNTER — Encounter (HOSPITAL_COMMUNITY): Payer: Self-pay | Admitting: Emergency Medicine

## 2024-04-30 ENCOUNTER — Ambulatory Visit (INDEPENDENT_AMBULATORY_CARE_PROVIDER_SITE_OTHER): Admitting: Student

## 2024-04-30 ENCOUNTER — Other Ambulatory Visit: Payer: Self-pay

## 2024-04-30 VITALS — BP 113/97 | HR 79

## 2024-04-30 DIAGNOSIS — Z515 Encounter for palliative care: Secondary | ICD-10-CM | POA: Diagnosis not present

## 2024-04-30 DIAGNOSIS — M160 Bilateral primary osteoarthritis of hip: Secondary | ICD-10-CM | POA: Diagnosis present

## 2024-04-30 DIAGNOSIS — F29 Unspecified psychosis not due to a substance or known physiological condition: Secondary | ICD-10-CM | POA: Diagnosis not present

## 2024-04-30 DIAGNOSIS — R4689 Other symptoms and signs involving appearance and behavior: Secondary | ICD-10-CM | POA: Diagnosis not present

## 2024-04-30 DIAGNOSIS — B961 Klebsiella pneumoniae [K. pneumoniae] as the cause of diseases classified elsewhere: Secondary | ICD-10-CM | POA: Diagnosis present

## 2024-04-30 DIAGNOSIS — F03918 Unspecified dementia, unspecified severity, with other behavioral disturbance: Secondary | ICD-10-CM | POA: Diagnosis not present

## 2024-04-30 DIAGNOSIS — Z79899 Other long term (current) drug therapy: Secondary | ICD-10-CM

## 2024-04-30 DIAGNOSIS — Z66 Do not resuscitate: Secondary | ICD-10-CM | POA: Diagnosis present

## 2024-04-30 DIAGNOSIS — H409 Unspecified glaucoma: Secondary | ICD-10-CM | POA: Diagnosis present

## 2024-04-30 DIAGNOSIS — R600 Localized edema: Secondary | ICD-10-CM | POA: Insufficient documentation

## 2024-04-30 DIAGNOSIS — F03911 Unspecified dementia, unspecified severity, with agitation: Principal | ICD-10-CM | POA: Diagnosis present

## 2024-04-30 DIAGNOSIS — R410 Disorientation, unspecified: Secondary | ICD-10-CM

## 2024-04-30 DIAGNOSIS — M7989 Other specified soft tissue disorders: Secondary | ICD-10-CM

## 2024-04-30 DIAGNOSIS — J90 Pleural effusion, not elsewhere classified: Secondary | ICD-10-CM | POA: Diagnosis present

## 2024-04-30 DIAGNOSIS — E785 Hyperlipidemia, unspecified: Secondary | ICD-10-CM | POA: Diagnosis present

## 2024-04-30 DIAGNOSIS — F05 Delirium due to known physiological condition: Secondary | ICD-10-CM | POA: Diagnosis present

## 2024-04-30 DIAGNOSIS — E440 Moderate protein-calorie malnutrition: Secondary | ICD-10-CM | POA: Diagnosis present

## 2024-04-30 DIAGNOSIS — I10 Essential (primary) hypertension: Secondary | ICD-10-CM | POA: Diagnosis present

## 2024-04-30 DIAGNOSIS — R4182 Altered mental status, unspecified: Secondary | ICD-10-CM | POA: Diagnosis present

## 2024-04-30 DIAGNOSIS — I872 Venous insufficiency (chronic) (peripheral): Secondary | ICD-10-CM | POA: Diagnosis present

## 2024-04-30 DIAGNOSIS — N390 Urinary tract infection, site not specified: Secondary | ICD-10-CM | POA: Diagnosis not present

## 2024-04-30 DIAGNOSIS — Z789 Other specified health status: Secondary | ICD-10-CM

## 2024-04-30 DIAGNOSIS — M810 Age-related osteoporosis without current pathological fracture: Secondary | ICD-10-CM | POA: Diagnosis present

## 2024-04-30 DIAGNOSIS — Z6821 Body mass index (BMI) 21.0-21.9, adult: Secondary | ICD-10-CM

## 2024-04-30 DIAGNOSIS — N3000 Acute cystitis without hematuria: Secondary | ICD-10-CM | POA: Diagnosis not present

## 2024-04-30 DIAGNOSIS — Z832 Family history of diseases of the blood and blood-forming organs and certain disorders involving the immune mechanism: Secondary | ICD-10-CM

## 2024-04-30 DIAGNOSIS — F0390 Unspecified dementia without behavioral disturbance: Secondary | ICD-10-CM | POA: Diagnosis present

## 2024-04-30 DIAGNOSIS — E0590 Thyrotoxicosis, unspecified without thyrotoxic crisis or storm: Secondary | ICD-10-CM | POA: Diagnosis present

## 2024-04-30 DIAGNOSIS — Z7189 Other specified counseling: Secondary | ICD-10-CM | POA: Diagnosis not present

## 2024-04-30 DIAGNOSIS — Z7983 Long term (current) use of bisphosphonates: Secondary | ICD-10-CM

## 2024-04-30 LAB — I-STAT CG4 LACTIC ACID, ED: Lactic Acid, Venous: 1.3 mmol/L (ref 0.5–1.9)

## 2024-04-30 LAB — I-STAT VENOUS BLOOD GAS, ED
Acid-Base Excess: 5 mmol/L — ABNORMAL HIGH (ref 0.0–2.0)
Bicarbonate: 31.1 mmol/L — ABNORMAL HIGH (ref 20.0–28.0)
Calcium, Ion: 1.19 mmol/L (ref 1.15–1.40)
HCT: 42 % (ref 36.0–46.0)
Hemoglobin: 14.3 g/dL (ref 12.0–15.0)
O2 Saturation: 99 %
Potassium: 4.1 mmol/L (ref 3.5–5.1)
Sodium: 138 mmol/L (ref 135–145)
TCO2: 33 mmol/L — ABNORMAL HIGH (ref 22–32)
pCO2, Ven: 50.3 mmHg (ref 44–60)
pH, Ven: 7.4 (ref 7.25–7.43)
pO2, Ven: 124 mmHg — ABNORMAL HIGH (ref 32–45)

## 2024-04-30 LAB — RAPID URINE DRUG SCREEN, HOSP PERFORMED
Amphetamines: NOT DETECTED
Barbiturates: NOT DETECTED
Benzodiazepines: NOT DETECTED
Cocaine: NOT DETECTED
Opiates: NOT DETECTED
Tetrahydrocannabinol: NOT DETECTED

## 2024-04-30 LAB — CBC WITH DIFFERENTIAL/PLATELET
Abs Immature Granulocytes: 0.04 K/uL (ref 0.00–0.07)
Basophils Absolute: 0 K/uL (ref 0.0–0.1)
Basophils Relative: 0 %
Eosinophils Absolute: 0 K/uL (ref 0.0–0.5)
Eosinophils Relative: 0 %
HCT: 43.1 % (ref 36.0–46.0)
Hemoglobin: 14.4 g/dL (ref 12.0–15.0)
Immature Granulocytes: 0 %
Lymphocytes Relative: 12 %
Lymphs Abs: 1.1 K/uL (ref 0.7–4.0)
MCH: 31.4 pg (ref 26.0–34.0)
MCHC: 33.4 g/dL (ref 30.0–36.0)
MCV: 94.1 fL (ref 80.0–100.0)
Monocytes Absolute: 0.5 K/uL (ref 0.1–1.0)
Monocytes Relative: 5 %
Neutro Abs: 8.1 K/uL — ABNORMAL HIGH (ref 1.7–7.7)
Neutrophils Relative %: 83 %
Platelets: 231 K/uL (ref 150–400)
RBC: 4.58 MIL/uL (ref 3.87–5.11)
RDW: 12.1 % (ref 11.5–15.5)
WBC: 9.8 K/uL (ref 4.0–10.5)
nRBC: 0 % (ref 0.0–0.2)

## 2024-04-30 LAB — URINALYSIS, ROUTINE W REFLEX MICROSCOPIC
Bilirubin Urine: NEGATIVE
Glucose, UA: NEGATIVE mg/dL
Hgb urine dipstick: NEGATIVE
Ketones, ur: 20 mg/dL — AB
Nitrite: POSITIVE — AB
Protein, ur: NEGATIVE mg/dL
Specific Gravity, Urine: 1.015 (ref 1.005–1.030)
pH: 6 (ref 5.0–8.0)

## 2024-04-30 LAB — TSH: TSH: 0.157 u[IU]/mL — ABNORMAL LOW (ref 0.350–4.500)

## 2024-04-30 LAB — COMPREHENSIVE METABOLIC PANEL WITH GFR
ALT: 16 U/L (ref 0–44)
AST: 39 U/L (ref 15–41)
Albumin: 4 g/dL (ref 3.5–5.0)
Alkaline Phosphatase: 48 U/L (ref 38–126)
Anion gap: 16 — ABNORMAL HIGH (ref 5–15)
BUN: 23 mg/dL (ref 8–23)
CO2: 23 mmol/L (ref 22–32)
Calcium: 9.6 mg/dL (ref 8.9–10.3)
Chloride: 97 mmol/L — ABNORMAL LOW (ref 98–111)
Creatinine, Ser: 1.02 mg/dL — ABNORMAL HIGH (ref 0.44–1.00)
GFR, Estimated: 52 mL/min — ABNORMAL LOW (ref >60)
Glucose, Bld: 124 mg/dL — ABNORMAL HIGH (ref 70–99)
Potassium: 4.4 mmol/L (ref 3.5–5.1)
Sodium: 136 mmol/L (ref 135–145)
Total Bilirubin: 1.3 mg/dL — ABNORMAL HIGH (ref 0.0–1.2)
Total Protein: 7.2 g/dL (ref 6.5–8.1)

## 2024-04-30 LAB — CBG MONITORING, ED: Glucose-Capillary: 111 mg/dL — ABNORMAL HIGH (ref 70–99)

## 2024-04-30 LAB — ETHANOL: Alcohol, Ethyl (B): <15 mg/dL (ref <15)

## 2024-04-30 MED ORDER — SIMVASTATIN 20 MG PO TABS
20.0000 mg | ORAL_TABLET | Freq: Every evening | ORAL | Status: DC
Start: 2024-05-01 — End: 2024-05-05
  Administered 2024-05-04: 20 mg via ORAL
  Filled 2024-04-30 (×2): qty 1

## 2024-04-30 MED ORDER — LATANOPROST 0.005 % OP SOLN
1.0000 [drp] | Freq: Every day | OPHTHALMIC | Status: DC
  Administered 2024-05-01 – 2024-05-06 (×5): 1 [drp] via OPHTHALMIC
  Filled 2024-04-30: qty 2.5

## 2024-04-30 MED ORDER — DONEPEZIL HCL 10 MG PO TABS: 5.0000 mg | ORAL_TABLET | Freq: Every day | ORAL | Status: DC

## 2024-04-30 MED ORDER — HALOPERIDOL LACTATE 5 MG/ML IJ SOLN
5.0000 mg | Freq: Once | INTRAMUSCULAR | Status: AC
  Administered 2024-04-30: 5 mg via INTRAMUSCULAR
  Filled 2024-04-30: qty 1

## 2024-04-30 MED ORDER — CEFTRIAXONE SODIUM 1 G IJ SOLR
1.0000 g | Freq: Once | INTRAMUSCULAR | Status: AC
  Administered 2024-04-30: 1 g via INTRAVENOUS
  Filled 2024-04-30: qty 10

## 2024-04-30 MED ORDER — ENOXAPARIN SODIUM 40 MG/0.4ML IJ SOSY
40.0000 mg | PREFILLED_SYRINGE | Freq: Every day | INTRAMUSCULAR | Status: DC
  Administered 2024-05-03: 40 mg via SUBCUTANEOUS
  Filled 2024-04-30 (×3): qty 0.4

## 2024-04-30 NOTE — ED Notes (Signed)
 Pt refusing all x-rays stating I'm going to break the machine if you put me in there. EDP notified by xray dept.

## 2024-04-30 NOTE — ED Notes (Signed)
 EDP at Amanda Shaw

## 2024-04-30 NOTE — Progress Notes (Signed)
 LLE venous duplex has been completed.  Preliminary results given to Dr. Lenor.   Results can be found under chart review under CV PROC. 04/30/2024 4:04 PM Ariel Wingrove RVT, RDMS

## 2024-04-30 NOTE — Assessment & Plan Note (Signed)
 TSH of 0.157, stable from 0.1875 months ago. - No further intervention indicated inpatient - Consider further management outpatient

## 2024-04-30 NOTE — ED Triage Notes (Signed)
 Pt was BIB [facility] from her family practice after family noticed agitation for the past month. PCP wants routine labs to ensure no infection. PCP also alerted EMS to family increasing patients agitation. EMS says pt is less agitated now that family isn't present.   CBG 140  18 RR BP 150/90 P 90
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
## 2024-04-30 NOTE — Assessment & Plan Note (Addendum)
-   Admit to FMTS, attending Dr. Camie Mulch - Med-surg, Vital signs per floor - Regular diet  - PT/OT to treat - VTE prophylaxis with Lovenox  (not renally adjusted) - Continue antibiotics with IV ceftriaxone (9/29- ) - Urine culture pending - Labs: AM CBC, BMP  - Fall precautions, Delirium precautions in place - If patient becomes more agitated and nonredirectable, can consider spot dosing Haldol overnight - Can also consider mittens if patient tries to remove lines and is not redirectable - Consider psych/geropsych workup (likely outpatient) if symptoms do not improve with antibiotics - Consider brain MRI if worsening AMS and/or develops neuro-deficits

## 2024-04-30 NOTE — Hospital Course (Addendum)
 Amanda Shaw is a 89 y.o. year old with a history of HTN, HLD, glaucoma, dementia, osteoporosis who presented with altered mental status and urinary frequency and was admitted to the [facility] Medicine Teaching Service for UTI.  AMS UTI Per family, patient acting different from baseline over past 3 to 4 weeks, and especially agitated and aggressive over the past couple of days.  Increased urinary frequency, UA suggestive of UTI. Patient started on IV antibiotics with ceftriaxone (9/29- 10/3).  Throughout admission patient had waxing and waning responsiveness that did not improve with UTI treatment.  MRI brain negative workup otherwise unrevealing.  Likely progressive decline of dementia, family opted to focus on comfort measures on 10/6. She was discharge to ***.  Other chronic conditions were medically managed with home medications and formulary alternatives as necessary (HTN, HLD, glaucoma, dementia).

## 2024-04-30 NOTE — Progress Notes (Signed)
    SUBJECTIVE:   CHIEF COMPLAINT / HPI:   89 year old female with history of HTN, AMS, seizure-like activity Presenting today with 2 daughters Family is concerned about patient's restlessness, anxiousness and agitated behavior. Of note symptoms first initially started on April 2025 The patient will have severe agitation, irritated behavior and multiple brief instances of confusion Her April symptoms lasted for months and she had 3 months of no symptoms However in the last 2 months her symptoms have restarted Daughter reports she has had obsessive cleaning, and not eating well. Unsure of any free sure workup for dementia in geriatric patient Previously suspected to have psychogenic activities Saw psychiatry at the time which family said  started her on memory medication Unsure of what medication (per chart review suspect donepezil ) No recent life changes other than daughter moving in in January and patient spouse passing in April of 2024   PERTINENT  PMH / PSH: Reviewed   OBJECTIVE:   BP (!) 113/97   Pulse 79   SpO2 100%    Physical Exam General: Elderly, NAD, intermittently agitated but occasionally pleasant during encounter oriented to person, place and year but not to the [place]  president (said [place]).  ASSESSMENT/PLAN:   Syncope Her presentation appears atypical.  Suspect possible underlying dementia. Also given her age ongoing infection could attribute to behavioral changes. Reassuring her vitals are generally stable. Can't rule out psychogenic activity at this time. However given her agitated behavior and concern for patient's safety will be sending to the ED for appropriate work up, close monitoring and  evaluation.  Would benefit from [facility] geriatric evaluation to determine best living situation for patient.     Norleen April, MD [facility]

## 2024-04-30 NOTE — Assessment & Plan Note (Signed)
 Left lower leg with mild nonpitting edema, more so than right side.  Ultrasound negative for left-sided DVT.  X-ray of left ankle without fracture or dislocation, with moderate soft tissue swelling.  No history of heart failure.  Patient with known history of venous insufficiency, consider this as cause of edema. - No further workup indicated

## 2024-04-30 NOTE — Assessment & Plan Note (Signed)
 HTN: Holding home HCTZ 12.5 mg daily in setting of low-normal BPs. HLD: Continue home simvastatin  20 mg daily Glaucoma: Continue home latanoprost 1 drop both eyes daily Dementia: Continue home donepezil  5 mg daily

## 2024-04-30 NOTE — ED Provider Notes (Signed)
 Care assumed from Dr. Lenor.  At time of transfer of care, patient awaiting urinalysis and further workup in the context of agitation and some unsafe behavior by report.  She reportedly has been wandering out of the house and even walking in front of vehicles.  Anticipate completion of medical clearance workup followed by likely TTS consultation.  4:43 PM I was asked to go see the patient by nursing to have a discussion about workup.  I talked with the patient and family and patient now agrees to get the head CT chest x-ray and ankle x-ray and will give us  urine for urinalysis as she has had some frequency.  Family says that this is really only been several weeks that she has had this mental status decline where now she is wandering into traffic, get more agitated and aggressive.  Given the faster timeline this seems more delirium than just dementia so we will see what her workup shows and see if there is a reason to admit from a medical standpoint.  If it is all negative, it does not sound like patient is safe at home at this time and we will like need to consult TTS and discuss boarding or even placement  6:30 PM Urinalysis does show UTI.  Will order antibiotics and admit for delirium in the setting of urinary tract infection.       Chaseton Yepiz, Lonni PARAS, MD 04/30/24 [phone]

## 2024-04-30 NOTE — ED Provider Notes (Signed)
 [place] EMERGENCY DEPARTMENT AT [facility] Provider Note   CSN: [id number] Arrival date & time: 04/30/24  1239     Patient presents with: Agitation   Amanda Shaw is a 89 y.o. female.  {Add pertinent medical, surgical, social history, OB history to HPI:32947} Patient is a 89 year old female with a history of hypertension, seizure-like activity, and peripheral venous insufficiency who presents with agitated behavior.  She has had some progressive behavioral issues over the last few months.  She lives with her daughter.  She is becoming more aggressive.  She has been walking out in the middle the street without looking for cars.  She has been talking about family members who are her dead.  She randomly starts saying the alphabet per daughter who is at bedside.  She went to her PCP today and was sent here for further evaluation.  She has been seen by neurology and there was some concerns that she is starting to have dementia.  No prior psychiatric history.  They have not noticed any recent illnesses.  No fevers.  No vomiting or diarrhea.       Prior to Admission medications   Medication Sig Start Date End Date Taking? Authorizing Provider  alendronate  (FOSAMAX ) 70 MG tablet Take 1 tablet (70 mg total) by mouth every 7 (seven) days with a full glass of water on an empty stomach. 02/14/24   Donah Laymon PARAS, MD  Calcium Carbonate-Vitamin D  (CALTRATE 600+D PO) Take 1 tablet by mouth daily.    [provider]  donepezil  (ARICEPT ) 5 MG tablet Take 1 tablet (5 mg total) by mouth at bedtime. 02/14/24   Wertman, Sara E, PA-C  hydrochlorothiazide  (HYDRODIURIL ) 12.5 MG tablet TAKE 1 TABLET EVERY DAY 06/17/23   McIntyre, Brittany J, MD  latanoprost (XALATAN) 0.005 % ophthalmic solution Place 1 drop into both eyes at bedtime.    [provider]  melatonin 3 MG TABS tablet Take 3 mg by mouth at bedtime.    [provider]  simvastatin  (ZOCOR ) 20 MG  tablet TAKE 1 TABLET EVERY EVENING 05/14/23   Donah Laymon PARAS, MD    Allergies: Patient has no known allergies.    Review of Systems  Unable to perform ROS: Psychiatric disorder    Updated Vital Signs Ht 5' 3 (1.6 m)   BMI 22.67 kg/m   Physical Exam Constitutional:      Appearance: She is well-developed.  HENT:     Head: Normocephalic and atraumatic.  Eyes:     Pupils: Pupils are equal, round, and reactive to light.  Cardiovascular:     Rate and Rhythm: Normal rate and regular rhythm.     Heart sounds: Normal heart sounds.  Pulmonary:     Effort: Pulmonary effort is normal. No respiratory distress.     Breath sounds: Normal breath sounds. No wheezing or rales.  Chest:     Chest wall: No tenderness.  Abdominal:     General: Bowel sounds are normal.     Palpations: Abdomen is soft.     Tenderness: There is no abdominal tenderness. There is no guarding or rebound.  Musculoskeletal:        General: Normal range of motion.     Cervical back: Normal range of motion and neck supple.  Lymphadenopathy:     Cervical: No cervical adenopathy.  Skin:    General: Skin is warm and dry.     Findings: No rash.  Neurological:     Mental Status:  She is alert and oriented to person, place, and time.     Comments: Patient is oriented to person, place and year.  She said it is October versus September.  However she does not make a lot of sense when she is talking.  She randomly starts saying letters of the alphabet.  She asked me what my name is and she says that she will be okay with me seeing her if my name starts with a W.       (all labs ordered are listed, but only abnormal results are displayed) Labs Reviewed  COMPREHENSIVE METABOLIC PANEL WITH GFR - Abnormal; Notable for the following components:      Result Value   Chloride 97 (*)    Glucose, Bld 124 (*)    Creatinine, Ser 1.02 (*)    Total Bilirubin 1.3 (*)    GFR, Estimated 52 (*)    Anion gap 16 (*)    All other  components within normal limits  CBC WITH DIFFERENTIAL/PLATELET - Abnormal; Notable for the following components:   Neutro Abs 8.1 (*)    All other components within normal limits  I-STAT VENOUS BLOOD GAS, ED - Abnormal; Notable for the following components:   pO2, Ven 124 (*)    Bicarbonate 31.1 (*)    TCO2 33 (*)    Acid-Base Excess 5.0 (*)    All other components within normal limits  RAPID URINE DRUG SCREEN, HOSP PERFORMED  ETHANOL  URINALYSIS, ROUTINE W REFLEX MICROSCOPIC  CBG MONITORING, ED  I-STAT CG4 LACTIC ACID, ED    EKG: None  Radiology: No results found.  {Document cardiac monitor, telemetry assessment procedure when appropriate:32947} Procedures   Medications Ordered in the ED  haloperidol lactate (HALDOL) injection 5 mg (has no administration in time range)      {Click here for ABCD2, HEART and other calculators REFRESH Note before signing:1}                              Medical Decision Making Risk Prescription drug management.   This patient presents to the ED for concern of confusion, agitated behavior, this involves an extensive number of treatment options, and is a complaint that carries with it a high risk of complications and morbidity.  I considered the following differential and admission for this acute, potentially life threatening condition.  The differential diagnosis includes new onset psychosis, dementia, electrolyte abnormality, anemia, infection, thyroid  issue, brain mass, intracranial hemorrhage  MDM:    Patient is a 89 year old who presents with increasingly agitated behavior.  However she has had some confusion and bizarre behavior for the last several months.  Now she is becoming more violent and agitated.  She does not have any focal neurologic deficits.  She is moving all extremities symmetrically.  She actually is fairly oriented but otherwise is not making a lot of sense.  CBC shows a normal white count, electrolytes showed a minimally  elevated creatinine but otherwise nonconcerning.  Other labs are pending.  (Labs, imaging, consults)  Labs: I Ordered, and personally interpreted labs.  The pertinent results include: Normal white count, electrolytes nonconcerning  Imaging Studies ordered: I ordered imaging studies including pending I independently visualized and interpreted imaging. I agree with the radiologist interpretation  Additional history obtained from daughter.  External records from outside source obtained and reviewed including history  Cardiac Monitoring: The patient was not maintained on a cardiac monitor.  If on  the cardiac monitor, I personally viewed and interpreted the cardiac monitored which showed an underlying rhythm of:    Reevaluation: After the interventions noted above, I reevaluated the patient and found that they have :stayed the same  Social Determinants of Health:  lives with daughter  Disposition: Pending  Co morbidities that complicate the patient evaluation  Past Medical History:  Diagnosis Date   Arthritis    arthritis in hips    Candida infection of flexural skin 11/09/2021   Fracture of ankle, left, closed 10/1998   Gastritis 07/2006   by endoscopy   Healthcare maintenance 09/10/2021   HEMORRHOIDS, NOS 09/29/2006   Qualifier: Diagnosis of  By: Levonne MD, Wayne     Hypertension    Subungual hematoma of right foot 02/18/2015   Right great toe      Medicines Meds ordered this encounter  Medications   haloperidol lactate (HALDOL) injection 5 mg    I have reviewed the patients home medicines and have made adjustments as needed  Problem List / ED Course: Problem List Items Addressed This Visit   None         {Document critical care time when appropriate  Document review of labs and clinical decision tools ie CHADS2VASC2, etc  Document your independent review of radiology images and any outside records  Document your discussion with family members, caretakers and  with consultants  Document social determinants of health affecting pt's care  Document your decision making why or why not admission, treatments were needed:32947:::1}   Final diagnoses:  None    ED Discharge Orders     None

## 2024-04-30 NOTE — ED Notes (Signed)
Pt. Being transported to vascular

## 2024-04-30 NOTE — Assessment & Plan Note (Addendum)
 Her presentation appears atypical.  Suspect possible underlying dementia. Also given her age ongoing infection could attribute to behavioral changes. Reassuring her vitals are generally stable. Can't rule out psychogenic activity at this time. However given her agitated behavior and concern for patient's safety will be sending to the ED for appropriate work up, close monitoring and  evaluation.  Would benefit from [facility] geriatric evaluation to determine best living situation for patient.

## 2024-04-30 NOTE — Plan of Care (Signed)

## 2024-04-30 NOTE — ED Provider Triage Note (Signed)
 Emergency Medicine Provider Triage Evaluation Note  Amanda Shaw , a 89 y.o. female  was evaluated in triage.  Pt complains of altered mental status.  Patient went to her PCPs office today thought needed to come here for emergent testing.  On my review it seems that the patient has been seen frequently for difficulty coping with her family moving in with her and taking away her independence.  Patient does complain of some right sided chest discomfort.  She also has some left leg swelling.  Review of Systems  Positive: R chest pain, L leg swelling Negative:   Physical Exam  Ht 5' 3 (1.6 m)   BMI 22.67 kg/m  Gen:   Awake, no distress   Resp:  Normal effort  MSK:   Moves extremities without difficulty  Other:    Medical Decision Making  Medically screening exam initiated at 1:44 PM.  Appropriate orders placed.  Amanda Shaw was informed that the remainder of the evaluation will be completed by another provider, this initial triage assessment does not replace that evaluation, and the importance of remaining in the ED until their evaluation is complete.     Emil Share, DO 04/30/24 1346

## 2024-04-30 NOTE — Patient Instructions (Signed)
 Pleasure to meet you  Her behaviors appears to be atypical but could be attributed to underlying dementia.  Other possible causes include infection, psychogenic causes. Doesn't appear to be true synchope  I will be sending her to the ED to evaluate for infection, and other possible causes   I recommend she have a geriatric evaluation here at out clinic with Dr. Keneth. We will call you to schedule an appointment. This will be after her ED visit.

## 2024-05-01 DIAGNOSIS — R410 Disorientation, unspecified: Secondary | ICD-10-CM

## 2024-05-01 DIAGNOSIS — F0390 Unspecified dementia without behavioral disturbance: Secondary | ICD-10-CM | POA: Diagnosis present

## 2024-05-01 DIAGNOSIS — R4689 Other symptoms and signs involving appearance and behavior: Secondary | ICD-10-CM | POA: Diagnosis present

## 2024-05-01 DIAGNOSIS — N390 Urinary tract infection, site not specified: Secondary | ICD-10-CM

## 2024-05-01 DIAGNOSIS — E440 Moderate protein-calorie malnutrition: Secondary | ICD-10-CM | POA: Insufficient documentation

## 2024-05-01 LAB — URINALYSIS, W/ REFLEX TO CULTURE (INFECTION SUSPECTED)
Bilirubin Urine: NEGATIVE
Glucose, UA: NEGATIVE mg/dL
Hgb urine dipstick: NEGATIVE
Ketones, ur: 20 mg/dL — AB
Nitrite: NEGATIVE
Protein, ur: NEGATIVE mg/dL
Specific Gravity, Urine: 1.014 (ref 1.005–1.030)
pH: 7 (ref 5.0–8.0)

## 2024-05-01 LAB — CBC
HCT: 41.2 % (ref 36.0–46.0)
Hemoglobin: 14.1 g/dL (ref 12.0–15.0)
MCH: 31.3 pg (ref 26.0–34.0)
MCHC: 34.2 g/dL (ref 30.0–36.0)
MCV: 91.6 fL (ref 80.0–100.0)
Platelets: 200 K/uL (ref 150–400)
RBC: 4.5 MIL/uL (ref 3.87–5.11)
RDW: 12.1 % (ref 11.5–15.5)
WBC: 7.1 K/uL (ref 4.0–10.5)
nRBC: 0 % (ref 0.0–0.2)

## 2024-05-01 LAB — BASIC METABOLIC PANEL WITH GFR
Anion gap: 13 (ref 5–15)
BUN: 15 mg/dL (ref 8–23)
CO2: 25 mmol/L (ref 22–32)
Calcium: 9.1 mg/dL (ref 8.9–10.3)
Chloride: 101 mmol/L (ref 98–111)
Creatinine, Ser: 0.8 mg/dL (ref 0.44–1.00)
GFR, Estimated: >60 mL/min (ref >60)
Glucose, Bld: 96 mg/dL (ref 70–99)
Potassium: 3.6 mmol/L (ref 3.5–5.1)
Sodium: 139 mmol/L (ref 135–145)

## 2024-05-01 MED ORDER — ADULT MULTIVITAMIN W/MINERALS CH
1.0000 | ORAL_TABLET | Freq: Every day | ORAL | Status: DC
  Administered 2024-05-01: 1 via ORAL
  Filled 2024-05-01 (×2): qty 1

## 2024-05-01 MED ORDER — ENSURE PLUS HIGH PROTEIN PO LIQD
237.0000 mL | Freq: Two times a day (BID) | ORAL | Status: DC
  Administered 2024-05-01 – 2024-05-06 (×3): 237 mL via ORAL

## 2024-05-01 MED ORDER — SODIUM CHLORIDE 0.9 % IV SOLN
1.0000 g | INTRAVENOUS | Status: DC
  Administered 2024-05-01 – 2024-05-02 (×2): 1 g via INTRAVENOUS
  Filled 2024-05-01 (×2): qty 10

## 2024-05-01 MED ORDER — HYDROCHLOROTHIAZIDE 12.5 MG PO TABS
12.5000 mg | ORAL_TABLET | Freq: Every day | ORAL | Status: DC
Start: 2024-05-01 — End: 2024-05-05
  Administered 2024-05-01 – 2024-05-04 (×2): 12.5 mg via ORAL
  Filled 2024-05-01 (×2): qty 1

## 2024-05-01 NOTE — Evaluation (Signed)
 Occupational Therapy Evaluation and Discharge Patient Details Name: Amanda Shaw MRN: [id number] DOB: 1930-04-23 Today's Date: 05/01/2024   History of Present Illness   89 y.o. female presenting 9/29 with AMS and increased urinary frequency and edema of L LE. No DVT or fx. PMH: HTN, glaucoma, Osteoporosis, venous insufficiency and dementia.     Clinical Impressions Per chart review, at baseline, pt lives with her daughter in a 1 level house with a ramp to enter and completes ADLs Mod I and functional mobility with a walker. Pt now presents with decreased cognition affecting pt safety and participation in functional tasks. Pt unable to follow 1-step commands this session, but responded well to being given two choices. Pt politely declined sitting EOB and OOB activity repeatedly during session, but demonstrates ability to largely complete self-directed ADLs from bed level with Set up/Supervision with verbal cues to orient pt to available items (i.e. cup, wash cloth, socks). Pt also observed to have B UE AROM, strength, and coordination WFL during self-directed activities, but unable to follow instructions for further assessment. Pt not appropriate for further acute OT services at this time secondary to current cognitive level. Pt would benefit from return to familiar environment with 24/7 supervision/assistance for safety as soon as possible. If family is unable to provide/coordinate 24/7 care, pt may require long-term Memory Care placement. Recommend Mobility Specialist services while in hospital to maintain mobility and general strength/activity tolerance. Acute OT is signing off at this time.     If plan is discharge home, recommend the following:   A little help with walking and/or transfers;A little help with bathing/dressing/bathroom;Assistance with cooking/housework;Direct supervision/assist for medications management;Direct supervision/assist for financial management;Assist for  transportation;Help with stairs or ramp for entrance;Supervision due to cognitive status;Other (comment) (24/7 supervision/assistance for safety)     Functional Status Assessment   Patient has had a recent decline in their functional status and/or demonstrates limited ability to make significant improvements in function in a reasonable and predictable amount of time     Equipment Recommendations   None recommended by OT     Recommendations for Other Services         Precautions/Restrictions   Precautions Precautions: Fall Recall of Precautions/Restrictions: Impaired Restrictions Weight Bearing Restrictions Per Provider Order: No     Mobility Bed Mobility Overal bed mobility: Needs Assistance Bed Mobility: Supine to Sit, Sit to Supine           General bed mobility comments: Pt repeatedly calmly declining sitting EOB or OOB activity this session. Pt completed supine <-> sit with PT earlier this day with CGA    Transfers Overall transfer level: Needs assistance                 General transfer comment: Pt repeatedly calmly declining sitting EOB or OOB activity this session. Pt completed sit <-> stand with PT earlier this day with a RW with CGA      Balance Overall balance assessment: Needs assistance Sitting-balance support: Single extremity supported, Bilateral upper extremity supported, No upper extremity supported, Feet supported Sitting balance-Leahy Scale: Fair Sitting balance - Comments: In long sitting in the bed                                   ADL either performed or assessed with clinical judgement   ADL Overall ADL's : Needs assistance/impaired Eating/Feeding: Set up;Bed level (with orientation to items)  Grooming: Set up;Bed level (with orientation to items)               Lower Body Dressing: Set up;Bed level;Supervision/safety (with orientation to items) Lower Body Dressing Details (indicate cue type and reason):  to donn/doff socks in bed               General ADL Comments: Assessment limited as pt unable to follow 1-step instructions and politely decining sitting EOB or OOB activity multiple times. Pt participated in self-directed self-feeding, washing/drying face with wash cloth, and donning/doffing socks from bed level with set up from OT and OT providing verbal orientation to objects. However, pt unable to follow direct cues, such as, Pick up the wash cloth, or, Wash your face.     Vision Patient Visual Report: Other (comment) (Pt unable to report) Additional Comments: Pt unable to follow 1-step commands or report current vision, but appears to have [facility] for tasks assessed as pt able to appropriately locate, reach for, and grasp needed items during self-direceted activities     Perception         Praxis         Pertinent Vitals/Pain Pain Assessment Pain Assessment: Faces Faces Pain Scale: Hurts a little bit Pain Location: L ankle tender to touch and when donning/doffing socks Pain Descriptors / Indicators: Grimacing, Guarding Pain Intervention(s): Limited activity within patient's tolerance, Monitored during session, Repositioned     Extremity/Trunk Assessment Upper Extremity Assessment Upper Extremity Assessment: Difficult to assess due to impaired cognition (Pt unable to follow commands; demonstrates B UE AROM, strength, and coordination WFL with self-directed activities; pt unable to state if she is R or L handed, but appears to prefer R hand)   Lower Extremity Assessment Lower Extremity Assessment: Defer to PT evaluation RLE Deficits / Details: ROM and strength WFL from functional movement LLE Deficits / Details: L ankle swelling, does not cooperate with PT touching, ROM and strength WFL from functional movement   Cervical / Trunk Assessment Cervical / Trunk Assessment: Kyphotic   Communication Communication Communication: Impaired Factors Affecting Communication:  Difficulty expressing self   Cognition Arousal: Alert Behavior During Therapy: Flat affect Cognition: Cognition impaired, No family/caregiver present to determine baseline   Orientation impairments: Time, Situation (Pt oriented to self and knows she is at [facility]) Awareness: Online awareness impaired, Intellectual awareness impaired Memory impairment (select all impairments): Short-term memory, Working memory, Engineer, structural memory Attention impairment (select first level of impairment): Sustained attention Executive functioning impairment (select all impairments): Initiation, Organization, Sequencing, Reasoning, Problem solving OT - Cognition Comments: Pt oriented to self and place. Pt unable to follow 1-step instructions, but able to sequence familier self-directed tasks (i.e. doffing/donning socks, drinking from a cup with a straw, pulling blankets off and on)                 Following commands: Impaired Following commands impaired:  (Unable to follow 1-step commands at this time)     Cueing  General Comments   Cueing Techniques: Verbal cues;Gestural cues;Tactile cues;Visual cues (responds well to gentle hand over hand; responds well to being given two choices to choose from with significantly increased time for processing)  Noted L ankle edema   Exercises     Shoulder Instructions      Home Living Family/patient expects to be discharged to:: Private residence Living Arrangements: Children (daughter) Available Help at Discharge: Family;Available 24 hours/day Type of Home: House Home Access: Ramped entrance     Home  Layout: One level     Bathroom Shower/Tub: Sponge bathes at baseline   Bathroom Toilet: Handicapped height     Home Equipment: Wheelchair - Forensic psychologist (2 wheels);Cane - single point;BSC/3in1;Shower seat;Grab bars - tub/shower;Grab bars - toilet   Additional Comments: Home set-up per chart review as pt unable to provide  this session      Prior Functioning/Environment Prior Level of Function : Independent/Modified Independent             Mobility Comments: Ambulates with a walker at baseline, per chart review ADLs Comments: Mod I with ADLs and primarily sponge bathes per chart review    OT Problem List:     OT Treatment/Interventions:        OT Goals(Current goals can be found in the care plan section)   Acute Rehab OT Goals Patient Stated Goal: pt unable to state OT Goal Formulation: All assessment and education complete, DC therapy   OT Frequency:       Co-evaluation              AM-PAC OT 6 Clicks Daily Activity     Outcome Measure Help from another person eating meals?: A Little Help from another person taking care of personal grooming?: A Little Help from another person toileting, which includes using toliet, bedpan, or urinal?: A Little Help from another person bathing (including washing, rinsing, drying)?: A Little Help from another person to put on and taking off regular upper body clothing?: A Little Help from another person to put on and taking off regular lower body clothing?: A Little 6 Click Score: 18   End of Session Nurse Communication: Other (comment) (Pt unable to follow 1-step commands, but able to participate in self-directed activities from bed level with Set-up/Supervision and verbal orientation to items. Pt not appropriate for acute OT at this time. OT is signing off at this time.)  Activity Tolerance: Patient tolerated treatment well;Other (comment) (Pt limited by current cognitive level) Patient left: in bed;with call bell/phone within reach;with bed alarm set  OT Visit Diagnosis: Other symptoms and signs involving cognitive function                Time: 8984-8967 OT Time Calculation (min): 17 min Charges:  OT General Charges $OT Visit: 1 Visit OT Evaluation $OT Eval Low Complexity: 1 Low  Margarie Rockey HERO., OTR/L, MA Acute Rehab [phone]    Margarie FORBES Horns 05/01/2024, 11:23 AM

## 2024-05-01 NOTE — Assessment & Plan Note (Addendum)
 Patient with ongoing AMS. Her thought process remains largely disorganized and tangential. Demonstrates inability to follow basic commands and poor attention-testing. Likely acute delirium secondary to urinary tract infection on top of historical diagnosis of dementia. - Continue antibiotics with IV ceftriaxone (9/29- ) - Urine culture pending - Fall precautions, Delirium precautions in place - If patient becomes more agitated and nonredirectable, can consider spot dosing Haldol - Can also consider mittens if patient tries to remove lines and is not redirectable - Consider psych/geropsych workup (likely outpatient) if symptoms do not improve with antibiotics - Consider brain MRI if worsening AMS and/or develops neuro-deficits - PT/OT to treat

## 2024-05-01 NOTE — Assessment & Plan Note (Signed)
 Left lower leg with mild nonpitting edema, more so than right side. Ultrasound negative for left-sided DVT.  X-ray of left ankle without fracture or dislocation, with moderate soft tissue swelling.  No history of heart failure.  Patient with known history of venous insufficiency, consider this as cause of edema. - No further workup indicated

## 2024-05-01 NOTE — Evaluation (Addendum)
 Physical Therapy Evaluation and Discharge Patient Details Name: Amanda Shaw MRN: [id number] DOB: 1929-08-27 Today's Date: 05/01/2024  History of Present Illness  89 y.o. female presenting 9/29 with AMS and increased urinary frequency and edema of L LE. No DVT or fx. PMH: HTN, glaucoma, Osteoporosis, venous insufficiency and dementia.  Clinical Impression  Pt asleep on entry, rouses easily and is agreeable to go for a walk. Pt oriented to self and place, and provides constant commentary about [employer] movies, her husband and her family. No family present at time of Evaluation. Per prior notes she is living with her daughter in house with a ramped entrance. Pt agreeable to participation with PT to get up and go for a walk in hallway, when provided with gentle hand over hand assist and constant cuing for task at hand and increased time. Pt does not respond well to heavy hand or sudden movements. PT recommends return to her familiar surroundings as soon as possible. If family does not feel they are able to care for her, may need placement in Memory Care.  Given pt decreased command follow PT not going to follow, but will refer to Mobility Specialist to keep pt moving and decrease loss of strength.       If plan is discharge home, recommend the following: A little help with walking and/or transfers;A little help with bathing/dressing/bathroom;Assistance with cooking/housework;Direct supervision/assist for medications management;Direct supervision/assist for financial management;Assist for transportation;Help with stairs or ramp for entrance;Supervision due to cognitive status   Can travel by private vehicle    Yes    Equipment Recommendations None recommended by PT     Functional Status Assessment Patient has not had a recent decline in their functional status     Precautions / Restrictions Precautions Precautions: Fall Recall of Precautions/Restrictions:  Impaired Restrictions Weight Bearing Restrictions Per Provider Order: No      Mobility  Bed Mobility Overal bed mobility: Needs Assistance Bed Mobility: Supine to Sit, Sit to Supine     Supine to sit: Contact guard Sit to supine: Contact guard assist   General bed mobility comments: benefits from gentle touch and constant cuing for coming to EoB and back into bed.    Transfers Overall transfer level: Needs assistance Equipment used: Rolling walker (2 wheels) Transfers: Sit to/from Stand Sit to Stand: Contact guard assist           General transfer comment: contact guard for safety and cues for proper hand placement to power up to standing    Ambulation/Gait Ambulation/Gait assistance: Contact guard assist Gait Distance (Feet): 20 Feet Assistive device: Rolling walker (2 wheels) Gait Pattern/deviations: Step-through pattern, Decreased weight shift to left, Decreased step length - right, Decreased step length - left, Narrow base of support Gait velocity: slowed Gait velocity interpretation: <1.31 ft/sec, indicative of household ambulator   General Gait Details: contact guard for safety with slowed, small, narrow steps to door and back. Pt not interested in ambulation in hallway      Balance Overall balance assessment: Needs assistance Sitting-balance support: Feet supported, No upper extremity supported Sitting balance-Leahy Scale: Fair     Standing balance support: Bilateral upper extremity supported Standing balance-Leahy Scale: Poor Standing balance comment: benefits from at least single UE support                             Pertinent Vitals/Pain Pain Assessment Pain Assessment: Faces Faces Pain Scale: Hurts a little bit Pain  Location: L ankle tender to touch Pain Descriptors / Indicators: Grimacing, Guarding Pain Intervention(s): Monitored during session    Home Living Family/patient expects to be discharged to:: Private residence Living  Arrangements: Children Available Help at Discharge: Family;Available 24 hours/day Type of Home: House Home Access: Ramped entrance       Home Layout: One level Home Equipment: Wheelchair - Forensic psychologist (2 wheels);Cane - single point;BSC/3in1;Shower seat;Grab bars - tub/shower;Grab bars - toilet      Prior Function Prior Level of Function : Independent/Modified Independent             Mobility Comments: amb       Extremity/Trunk Assessment   Upper Extremity Assessment Upper Extremity Assessment: Defer to OT evaluation    Lower Extremity Assessment Lower Extremity Assessment: LLE deficits/detail;RLE deficits/detail RLE Deficits / Details: ROM and strength WFL from functional movement LLE Deficits / Details: L ankle swelling, does not cooperate with PT touching, ROM and strength WFL from functional movement    Cervical / Trunk Assessment Cervical / Trunk Assessment: Kyphotic  Communication   Communication Communication: Impaired Factors Affecting Communication: Difficulty expressing self    Cognition Arousal: Alert Behavior During Therapy: Flat affect   PT - Cognitive impairments: Orientation, Awareness, Memory, Problem solving, Safety/Judgement   Orientation impairments: Time, Situation                   PT - Cognition Comments: knows that she is at [facility], adamant that it is Oct 5th, Following commands: Impaired       Cueing Cueing Techniques: Verbal cues, Gestural cues, Tactile cues, Visual cues (responds well to gentle hand over hand)     General Comments General comments (skin integrity, edema, etc.): L ankle edema     Assessment/Plan    PT Assessment Patient does not need any further PT services         PT Goals (Current goals can be found in the Care Plan section)  Acute Rehab PT Goals PT Goal Formulation: Patient unable to participate in goal setting     AM-PAC PT 6 Clicks Mobility  Outcome Measure Help needed  turning from your back to your side while in a flat bed without using bedrails?: None Help needed moving from lying on your back to sitting on the side of a flat bed without using bedrails?: A Little Help needed moving to and from a bed to a chair (including a wheelchair)?: A Little Help needed standing up from a chair using your arms (e.g., wheelchair or bedside chair)?: A Little Help needed to walk in hospital room?: A Little Help needed climbing 3-5 steps with a railing? : A Little 6 Click Score: 19    End of Session Equipment Utilized During Treatment: Gait belt Activity Tolerance: Patient tolerated treatment well Patient left: in bed;with call bell/phone within reach;with bed alarm set Nurse Communication: Mobility status PT Visit Diagnosis: Other abnormalities of gait and mobility (R26.89);Muscle weakness (generalized) (M62.81)    Time: 9073-9050 PT Time Calculation (min) (ACUTE ONLY): 23 min   Charges:   PT Evaluation $PT Eval Low Complexity: 1 Low PT Treatments $Therapeutic Exercise: 8-22 mins PT General Charges $$ ACUTE PT VISIT: 1 Visit         Amanda Shaw PT, DPT Acute Rehabilitation Services Please use secure chat or  Call Office [phone]   Amanda Shaw [facility] 05/01/2024, 10:23 AM

## 2024-05-01 NOTE — Progress Notes (Signed)
 Transition of Care [facility]) - Inpatient Brief Assessment   Patient Details  Name: Amanda Shaw MRN: [id number] Date of Birth: 1930-01-14  Transition of Care [facility]) CM/SW Contact:    Rosaline JONELLE Joe, RN Phone Number: 05/01/2024, 4:18 PM   Clinical Narrative: CM met with the patient's son, Addie at the bedside to discuss IP Care management needs.  Patient's son states that the patient's daughter, Donzell provide 24 hour care at the home.  The son was agreeable to home health services and did not have a preference regarding home health choice.  I called [facility], Darleene, RNCM accepted for services for RN, MSW.  [facility] orders placed.  Resources provided to the patient's son for Adult day care program through [facility].  DME at the home includes ramp, Wc, RW, Cane 3:1.  No other IP Care management needs.   Transition of Care Asessment: Insurance and Status: (P) Insurance coverage has been reviewed Patient has primary care physician: (P) Yes Home environment has been reviewed: (P) from home with family Prior level of function:: (P) family assistance at home Prior/Current Home Services: (P) No current home services Social Drivers of Health Review: (P) SDOH reviewed interventions complete Readmission risk has been reviewed: (P) Yes Transition of care needs: (P) transition of care needs identified, TOC will continue to follow

## 2024-05-01 NOTE — Assessment & Plan Note (Signed)
 TSH of 0.157, stable from 0.1875 months ago. - No further intervention indicated inpatient - Consider further management outpatient

## 2024-05-01 NOTE — Assessment & Plan Note (Addendum)
 HTN: Restart home HCTZ 12.5 mg daily in setting of low-normal BPs. HLD: Continue home simvastatin  20 mg daily Glaucoma: Continue home latanoprost 1 drop both eyes daily Dementia: Hold home donepezil  5 mg daily

## 2024-05-01 NOTE — Progress Notes (Signed)
 Initial Nutrition Assessment  DOCUMENTATION CODES:   Non-severe (moderate) malnutrition in context of acute illness/injury  INTERVENTION:  Continue Regular diet, change to not room service appropriate  Ensure Plus High Protein po BID, each supplement provides 350 kcal and 20 grams of protein. MVI w/minerals daily   NUTRITION DIAGNOSIS:   Moderate Malnutrition related to acute illness as evidenced by mild fat depletion, moderate muscle depletion.    GOAL:   Patient will meet greater than or equal to 90% of their needs    MONITOR:   PO intake, Supplement acceptance  REASON FOR ASSESSMENT:   Consult Assessment of nutrition requirement/status  ASSESSMENT:   Presenting with AMS and increased urinary frequency. Patient with recent change in the last 3 to 4 weeks, worsened in last couple days, to baseline mental status/functioning per family.  Patient seen in room, sitting up at the edge of bed eating lunch. Patient with worse mental status compared to baseline, unable to provide diet or weight hx. Patient denied any chewing or swallowing issues her food. No PO intake documented in flowsheet, RN reports patient was on room service diet and missed breakfast tray. Updated weight taken this morning, 7% weight loss over the past 3 months. Patient with positive NFPE findings, meets ASPEN criteria for moderate malnutrition. Patient agreed to ensure supplements between meals, no flavor preference reported.   Meds Reviewed.  Labs Reviewed.     Wt Readings from Last 10 Encounters:  05/01/24 54 kg  01/19/24 58.1 kg  12/22/23 59.4 kg  12/15/23 58.7 kg  11/14/23 58.2 kg  11/11/23 58.2 kg  11/10/23 58.2 kg  11/07/23 58.4 kg  04/28/23 58.3 kg  01/03/23 57.6 kg    NUTRITION - FOCUSED PHYSICAL EXAM:  Flowsheet Row Most Recent Value  Orbital Region Mild depletion  Upper Arm Region Severe depletion  Thoracic and Lumbar Region Mild depletion  Buccal Region Moderate depletion   Temple Region Severe depletion  Clavicle Bone Region Mild depletion  Clavicle and Acromion Bone Region Moderate depletion  Scapular Bone Region Moderate depletion  Dorsal Hand Severe depletion  Patellar Region Moderate depletion  Anterior Thigh Region Moderate depletion  Posterior Calf Region Moderate depletion  Hair Reviewed  Eyes Reviewed  Mouth Reviewed  Nails Reviewed    Diet Order:   Diet Order             Diet regular Room service appropriate? No; Fluid consistency: Thin  Diet effective now                   EDUCATION NEEDS:   Not appropriate for education at this time  Skin:  Skin Assessment: Reviewed RN Assessment  Last BM:  PTA  Height:   Ht Readings from Last 1 Encounters:  04/30/24 5' 3 (1.6 m)    Weight:   Wt Readings from Last 1 Encounters:  05/01/24 54 kg    Ideal Body Weight:  52.3 kg  BMI:  Body mass index is 21.08 kg/m.  Estimated Nutritional Needs:   Kcal:  8379-8271 kcals  Protein:  54-65 grams  Fluid:  1.2-1.4L/d  Madalyn Potters, MS, RD, LDN Clinical Dietitian  Please see AMiON for contact information.

## 2024-05-01 NOTE — Care Management Obs Status (Signed)
 MEDICARE OBSERVATION STATUS NOTIFICATION   Patient Details  Name: MIANGEL FLOM MRN: [id number] Date of Birth: [birth year]   Medicare Observation Status Notification Given:  Yes  Verbally reviewed observation notice with Marcey Moynahan telephonically at [phone]. Son requested obs notice email to [email or web address]  Claretta Deed 05/01/2024, 2:53 PM

## 2024-05-01 NOTE — Progress Notes (Signed)
 Daily Progress Note Intern Pager: [phone]  Patient name: Amanda Shaw Medical record number: [id number] Date of birth: 10-03-1929 Age: 89 y.o. Gender: female  Primary Care Provider: Donah Laymon PARAS, MD Consultants: None Code Status: DNR/DNI  Pt Overview and Major Events to Date:  9/29 - Admitted  Patient is a 89 year old female presenting with AMS and increased urinary frequency found to have UTI. Pertinent PMH/PSH includes dementia, HTN, HLD, glaucoma, osteoporosis. Assessment & Plan UTI (urinary tract infection) AMS (altered mental status) Patient with ongoing AMS. Her thought process remains largely disorganized and tangential. Demonstrates inability to follow basic commands and poor attention-testing. Likely acute delirium secondary to urinary tract infection on top of historical diagnosis of dementia. - Continue antibiotics with IV ceftriaxone (9/29- ) - Urine culture pending - Fall precautions, Delirium precautions in place - If patient becomes more agitated and nonredirectable, can consider spot dosing Haldol - Can also consider mittens if patient tries to remove lines and is not redirectable - Consider psych/geropsych workup (likely outpatient) if symptoms do not improve with antibiotics - Consider brain MRI if worsening AMS and/or develops neuro-deficits - PT/OT to treat Edema of left lower extremity Left lower leg with mild nonpitting edema, more so than right side. Ultrasound negative for left-sided DVT.  X-ray of left ankle without fracture or dislocation, with moderate soft tissue swelling.  No history of heart failure.  Patient with known history of venous insufficiency, consider this as cause of edema. - No further workup indicated Subclinical hyperthyroidism TSH of 0.157, stable from 0.1875 months ago. - No further intervention indicated inpatient - Consider further management outpatient Chronic health problem HTN: Restart home HCTZ 12.5 mg daily  in setting of low-normal BPs. HLD: Continue home simvastatin  20 mg daily Glaucoma: Continue home latanoprost 1 drop both eyes daily Dementia: Hold home donepezil  5 mg daily  FEN/GI: Regular diet PPx: Lovenox  Dispo:Home vs SNF pending clinical improvement . Barriers include AMS.   Subjective:  Patient seen at bedside. She is alert and pleasant. When asked her name, she responds Amanda Shaw, but is able to state the year and location correctly. Unable to recall why she was brought to the hospital. Her thought process is largely disorganized, illogical, and tangential. She makes several bizarre statements including that she died 100 times last night. She is unable to follow basic commands and demonstrates poor attention. Denies increased urinary frequency and discomfort, but endorses urinary odor.  Objective: Temp:  [97.6 F (36.4 C)-98.1 F (36.7 C)] 98.1 F (36.7 C) (09/29 2240) Pulse Rate:  [74-94] 94 (09/30 0429) Resp:  [14-18] 18 (09/30 0429) BP: (102-157)/(53-97) 154/74 (09/30 0429) SpO2:  [97 %-100 %] 98 % (09/30 0429)  Physical Exam: General: elderly female, lying in bed, alert, oriented to place and time only Cardiovascular: regular rate and rhythm; normal S1/S2 Respiratory: clear to auscultation bilaterally; normal WOB on RA Abdomen: soft, non-tender, non-distended; normal bowel sounds Extremities: mild left lower extremity non-pitting edema, greater than minimal right lower extremity edema; moving all 4 extremities equally  Laboratory: Most recent CBC Lab Results  Component Value Date   WBC 7.1 05/01/2024   HGB 14.1 05/01/2024   HCT 41.2 05/01/2024   MCV 91.6 05/01/2024   PLT 200 05/01/2024   Most recent BMP    Latest Ref Rng & Units 05/01/2024    4:27 AM  BMP  Glucose 70 - 99 mg/dL 96   BUN 8 - 23 mg/dL 15   Creatinine 9.55 - 1.00  mg/dL 9.19   Sodium 864 - 854 mmol/L 139   Potassium 3.5 - 5.1 mmol/L 3.6   Chloride 98 - 111 mmol/L 101   CO2 22 - 32  mmol/L 25   Calcium 8.9 - 10.3 mg/dL 9.1     Imaging/Diagnostic Tests: No new imaging  Mannie Ashley SAILOR, MD 05/01/2024, 7:27 AM  PGY-1, [facility] FPTS Intern pager: [phone], text pages welcome Secure chat group [facility] Teaching Service

## 2024-05-02 ENCOUNTER — Observation Stay (HOSPITAL_COMMUNITY)

## 2024-05-02 DIAGNOSIS — N3000 Acute cystitis without hematuria: Secondary | ICD-10-CM | POA: Diagnosis not present

## 2024-05-02 LAB — BLOOD GAS, VENOUS
Acid-Base Excess: 5.8 mmol/L — ABNORMAL HIGH (ref 0.0–2.0)
Bicarbonate: 30.6 mmol/L — ABNORMAL HIGH (ref 20.0–28.0)
O2 Saturation: 97 %
Patient temperature: 36.7
pCO2, Ven: 43 mmHg — ABNORMAL LOW (ref 44–60)
pH, Ven: 7.45 — ABNORMAL HIGH (ref 7.25–7.43)
pO2, Ven: 65 mmHg — ABNORMAL HIGH (ref 32–45)

## 2024-05-02 LAB — GLUCOSE, CAPILLARY: Glucose-Capillary: 105 mg/dL — ABNORMAL HIGH (ref 70–99)

## 2024-05-02 MED ORDER — DEXTROSE IN LACTATED RINGERS 5 % IV SOLN: INTRAVENOUS | Status: AC

## 2024-05-02 NOTE — Evaluation (Signed)
 Clinical/Bedside Swallow Evaluation Patient Details  Name: Amanda Shaw MRN: [id number] Date of Birth: 04/23/1930  Today's Date: 05/02/2024 Time: SLP Start Time (ACUTE ONLY): 1300 SLP Stop Time (ACUTE ONLY): 1315 SLP Time Calculation (min) (ACUTE ONLY): 15 min  Past Medical History:  Past Medical History:  Diagnosis Date   Arthritis    arthritis in hips    Candida infection of flexural skin 11/09/2021   Fracture of ankle, left, closed 10/1998   Gastritis 07/2006   by endoscopy   Healthcare maintenance 09/10/2021   HEMORRHOIDS, NOS 09/29/2006   Qualifier: Diagnosis of  By: Levonne MD, [facility]     Hypertension    Subungual hematoma of right foot 02/18/2015   Right great toe    Past Surgical History:  Past Surgical History:  Procedure Laterality Date   ABDOMINAL HYSTERECTOMY     APPENDECTOMY     BACK SURGERY     BLADDER SUSPENSION  01/2004   CHOLECYSTECTOMY  1987   CRYOABLATION     bridge of nose, Dr Joshua   EXCISION/RELEASE BURSA HIP  09/22/2011   Procedure: EXCISION/RELEASE BURSA HIP;  Surgeon: Dempsey LULLA Moan, MD;  Location: [facility];  Service: Orthopedics;  Laterality: Right;  right hip bursectomy    LUMBAR DISC SURGERY  [phone]   SPIDER VEIN INJECTION Bilateral 2010   TONSILLECTOMY  1956   TRABECULECTOMY Bilateral    laser   VAGINAL HYSTERECTOMY  1988   benign disease   HPI:  Patient is a 89 year old female presenting with AMS and increased urinary frequency found to have UTI. Pertinent PMH/PSH includes dementia, HTN, HLD, glaucoma, osteoporosis.  SLP consulted for swallow evaluation to better guide medication management.    Assessment / Plan / Recommendation  Clinical Impression  Pt currently on a regular diet and thin liquids but has two untouched meal trays at bedside. SLP unable to fully arouse pt for session. Attempted wet spoon and ice chip; pt only partially manipulated these with delayed multiple swallows. Never alert enough to consider given a full  sip. Unsure if pt has periods of adequate arousal. Will leave diet for now and f/u for further assessment when fully alert. SLP Visit Diagnosis: Dysphagia, unspecified (R13.10)    Aspiration Risk  Risk for inadequate nutrition/hydration    Diet Recommendation           Other  Recommendations Oral Care Recommendations: Oral care QID     Assistance Recommended at Discharge    Functional Status Assessment Patient has had a recent decline in their functional status and demonstrates the ability to make significant improvements in function in a reasonable and predictable amount of time.  Frequency and Duration min 2x/week  2 weeks       Prognosis        Swallow Study   General HPI: Patient is a 89 year old female presenting with AMS and increased urinary frequency found to have UTI. Pertinent PMH/PSH includes dementia, HTN, HLD, glaucoma, osteoporosis.  SLP consulted for swallow evaluation to better guide medication management. Type of Study: Bedside Swallow Evaluation Previous Swallow Assessment: MBS 2019 Diet Prior to this Study: Regular;Thin liquids (Level 0) Temperature Spikes Noted: No Respiratory Status: Room air History of Recent Intubation: No Behavior/Cognition: Lethargic/Drowsy Oral Cavity Assessment: Dry Oral Care Completed by SLP: No Oral Cavity - Dentition: Edentulous Self-Feeding Abilities: Total assist Baseline Vocal Quality: Not observed Volitional Swallow: Unable to elicit    Oral/Motor/Sensory Function Overall Oral Motor/Sensory Function:  (unable to f/c)   [place]  chips: Impaired Oral Phase Impairments: Poor awareness of bolus   Thin Liquid Thin Liquid: Impaired Presentation:  (wet spoon)    Nectar Thick     Honey Thick     Puree     Solid            [name], Consuelo Fitch 05/02/2024,3:21 PM

## 2024-05-02 NOTE — Assessment & Plan Note (Signed)
-   Started Ensure supplementation BID by [employer]

## 2024-05-02 NOTE — Plan of Care (Signed)
   Problem: Education: Goal: Knowledge of General Education information will improve Description Including pain rating scale, medication(s)/side effects and non-pharmacologic comfort measures Outcome: Progressing

## 2024-05-02 NOTE — Assessment & Plan Note (Addendum)
 Patient with ongoing AMS. Her thought process remains largely disorganized and tangential. Demonstrates inability to follow basic commands and poor attention-testing. Likely acute delirium secondary to urinary tract infection on top of historical diagnosis of dementia. - Continue antibiotics with IV ceftriaxone (9/29-10/3) - Urine culture shows Klebsiella Pneumoniae, susceptibilities pending - Fall precautions, Delirium precautions in place - If patient becomes more agitated and nonredirectable, can consider spot dosing Haldol - Can also consider mittens if patient tries to remove lines and is not redirectable - Consider psych/geropsych workup (likely outpatient) if symptoms do not improve with antibiotics - Consider brain MRI if worsening AMS and/or develops neuro-deficits - PT/OT have seen patient and signed off - SLP consulted for swallow evaluation

## 2024-05-02 NOTE — Progress Notes (Signed)
 Patient has been sleeping all day. She is arousable with sternal rub, but she does not fully awaken and quickly returns to sleeping deeply. She has been unable to take medications today or eat and drink due to her sleepiness.  Dr notified of this change in status and an MRI was ordered.

## 2024-05-02 NOTE — Plan of Care (Signed)
 FMTS Interim Progress Note  S: I did a bedside to evaluate patient after being informed by the nurse that the family wanted to discuss level of consciousness.  Per nursing the patient had been arousable only to significant sternal rub or painful stimuli and did not maintain just as long enough to follow commands.  This presented an acute change from this morning when she was following commands and opening her eyes to a degree.  Family was not present at bedside at the time of my evaluation so I called and spoke with patient's son Amanda Shaw following examination of the patient.  Spoke at length about his mother's mental baseline.  Son and daughter-in-law provided timeline of patient's recent mental status changes.  They report that about a year ago her husband of 60+ years passed away and her daughter Amanda Shaw moved in with her.  Around that time she had a very stressful life change and underwent significant cognitive testing as she appeared to have something of a mental break.  During the following 6 months after that immediate time.  She seemed to improve and went back to her baseline of being active, conversational and able to follow what was going on in her day-to-day life.  They reported that over the last 3 months she has seemed to become more confused and easily agitated.  They report that she has been exhibiting behaviors such as obsessively cleaning the walls or the counters and when asked directly she would state that the clutter in her home that appeared after her daughter moved in was quite bothersome to her.  Around that time she also had started having more tangential conversations.  Her son reports that the tangents would make logical sense, however they were not related to anything in the conversation.  About 3 weeks ago she started having more behaviors rooted less in reality.  Family members reported her speaking as though family members who had past were still alive, she was becoming more  agitated and reckless in her behavior, walking down the street to try and get to her daughter-in-law's house and having to be physically carried back into her own house.  Over the last week this behavior has escalated and they believe that that is likely secondary to her UTI.  O: BP (!) 125/50 (BP Location: Right Arm)   Pulse 78   Temp 98.7 F (37.1 C) (Oral)   Resp 17   Ht 5' 3 (1.6 m)   Wt 54 kg   SpO2 95%   BMI 21.08 kg/m   General: Somnolent elderly female, no distress Cardiac: RRR, no M/R/G Respiratory: CTAB, no increased work of breathing Neurologic: Responsive only to painful stimuli, does not arouse without significant sternal stimulation, will open eyes briefly and then immediately go back to sleep.  Does not move without external stimulus.  Does not follow commands.  A/P: After discussion with family and taking into consideration her mental status earlier this morning, we will proceed with MRI brain without contrast.  This change in her mental status could still be explained to an extent by normal decline witnessed and dementia, however with the use of antibiotics it would make more sense if she were improving closer to what her baseline was at home.  Patient's son Amanda Shaw also gave permission for us  to contact his younger brother Amanda Shaw and his sister-in-law Amanda who live in the area and can provide more detailed descriptions of Amanda Shaw decline over the last several weeks.  Listed their  phone numbers below.  Amanda Shaw, youngest [phone] Amanda Shaw council  Amanda Lukes, DO 05/02/2024, 4:54 PM PGY-2, [facility] Family Medicine Service pager [phone]

## 2024-05-02 NOTE — Assessment & Plan Note (Signed)
 TSH of 0.157, stable from 0.1875 months ago. - No further intervention indicated inpatient - Consider further management outpatient

## 2024-05-02 NOTE — Plan of Care (Signed)
 FMTS Interim Progress Note  S: Patient seen on overnight rounds. Patient was asleep and only awoke briefly to sternal rub. RN, Hadassah Clunes, was at bedside. She has been caring for patient over the last 3 nights. This change in her mental status is new from previous nights. Patient was previously agitated and confused, but would ask for help to bedside commode and eat some of her food. Patient has been asleep most of the day and has taken in nothing by mouth. RN mentioned that patient was tachypneic after returning from MRI.  O: BP 122/64   Pulse 78   Temp 97.6 F (36.4 C)   Resp (!) 22   Ht 5' 3 (1.6 m)   Wt 54 kg   SpO2 99%   BMI 21.08 kg/m    General: somnolent elderly female, NAD Cardiovascular: RRR, no m/r/g Respiratory: CTAB anteriorly, normal work of breathing on room air  Abdomen: soft, non-distended, non-tender to palpation Extremities: warm, dry, no edema  Neurologic: responsive only to sternal rub, briefly opened eyes and made a sound, then fell back to sleep. Pupils reactive to light bilaterally   A/P: AMS/UTI Patient with acute mental status change today. Only responsive to sternal rub. MRI showed no acute abnormality. Unclear etiology of AMS.  - D5 LR infusion 75 mL/hr for 12 hours  - Chest x-ray to rule out aspiration pneumonia  - CBG  - AM labs: CBC, BMP, free T4  Subclinical hyperthyroidism - AM free T4  Rest of plan per day team.   Lennie Raguel MATSU, DO 05/02/2024, 10:09 PM PGY-1, [facility] Family Medicine Service pager [phone]

## 2024-05-02 NOTE — Assessment & Plan Note (Addendum)
 HTN: Restart home HCTZ 12.5 mg daily in setting of low-normal BPs. HLD: Continue home simvastatin  20 mg daily Glaucoma: Continue home latanoprost 1 drop both eyes daily Dementia: Hold home donepezil  5 mg daily

## 2024-05-02 NOTE — Assessment & Plan Note (Signed)
 Left lower leg with mild nonpitting edema, more so than right side. Ultrasound negative for left-sided DVT.  X-ray of left ankle without fracture or dislocation, with moderate soft tissue swelling.  No history of heart failure.  Patient with known history of venous insufficiency, consider this as cause of edema. - No further workup indicated

## 2024-05-02 NOTE — Plan of Care (Signed)
  Problem: Nutrition: Goal: Adequate nutrition will be maintained Outcome: Progressing   Problem: Education: Goal: Knowledge of General Education information will improve Description: Including pain rating scale, medication(s)/side effects and non-pharmacologic comfort measures Outcome: Not Progressing   Problem: Health Behavior/Discharge Planning: Goal: Ability to manage health-related needs will improve Outcome: Not Progressing   Problem: Clinical Measurements: Goal: Ability to maintain clinical measurements within normal limits will improve Outcome: Not Progressing Goal: Will remain free from infection Outcome: Not Progressing Goal: Diagnostic test results will improve Outcome: Not Progressing   Problem: Activity: Goal: Risk for activity intolerance will decrease Outcome: Not Progressing   Problem: Elimination: Goal: Will not experience complications related to bowel motility Outcome: Not Progressing Goal: Will not experience complications related to urinary retention Outcome: Not Progressing

## 2024-05-02 NOTE — Plan of Care (Signed)
  Problem: Education: Goal: Knowledge of General Education information will improve Description: Including pain rating scale, medication(s)/side effects and non-pharmacologic comfort measures Outcome: Not Progressing   Problem: Clinical Measurements: Goal: Will remain free from infection Outcome: Not Progressing Goal: Respiratory complications will improve Outcome: Not Progressing   Problem: Activity: Goal: Risk for activity intolerance will decrease Outcome: Not Progressing   Problem: Nutrition: Goal: Adequate nutrition will be maintained Outcome: Not Progressing

## 2024-05-02 NOTE — Progress Notes (Signed)
 Daily Progress Note Intern Pager: [phone]  Patient name: Amanda Shaw Medical record number: [id number] Date of birth: November 11, 1929 Age: 89 y.o. Gender: female  Primary Care Provider: Donah Laymon PARAS, MD Consultants: None Code Status: DNR/DNI  Pt Overview and Major Events to Date:  9/29 -Admitted  Assessment and Plan:  Patient is a 89 year old female presenting with AMS and increased urinary frequency found to have UTI. Pertinent PMH/PSH includes dementia, HTN, HLD, glaucoma, osteoporosis.   Today pt was GCS 10, not communicative, and consistent with her previous presentation per chart review. SLP consulted for swallow evaluation to better guide medication management. Pt has not been receiving Lovenox . Assessment & Plan UTI (urinary tract infection) AMS (altered mental status) Patient with ongoing AMS. Her thought process remains largely disorganized and tangential. Demonstrates inability to follow basic commands and poor attention-testing. Likely acute delirium secondary to urinary tract infection on top of historical diagnosis of dementia. - Continue antibiotics with IV ceftriaxone (9/29-10/3) - Urine culture shows Klebsiella Pneumoniae, susceptibilities pending - Fall precautions, Delirium precautions in place - If patient becomes more agitated and nonredirectable, can consider spot dosing Haldol - Can also consider mittens if patient tries to remove lines and is not redirectable - Consider psych/geropsych workup (likely outpatient) if symptoms do not improve with antibiotics - Consider brain MRI if worsening AMS and/or develops neuro-deficits - PT/OT have seen patient and signed off - SLP consulted for swallow evaluation Subclinical hyperthyroidism TSH of 0.157, stable from 0.1875 months ago. - No further intervention indicated inpatient - Consider further management outpatient Chronic health problem HTN: Restart home HCTZ 12.5 mg daily in setting of  low-normal BPs. HLD: Continue home simvastatin  20 mg daily Glaucoma: Continue home latanoprost 1 drop both eyes daily Dementia: Hold home donepezil  5 mg daily Edema of left lower extremity Left lower leg with mild nonpitting edema, more so than right side. Ultrasound negative for left-sided DVT.  X-ray of left ankle without fracture or dislocation, with moderate soft tissue swelling.  No history of heart failure.  Patient with known history of venous insufficiency, consider this as cause of edema. - No further workup indicated Malnutrition of moderate degree - Started Ensure supplementation BID by Dietician   FEN/GI: Regular diet w/ Ensure supplementation BID PPx: Lovenox  (not received) Dispo:Home vs SNF pending clinical improvement . Barriers include AMS.   Subjective:   No acute events overnight, Vital signs stable. Spoke with son who is HCPOA; per son, pt's baseline is communicative, ambulatory, and conversational.  Objective: Temp:  [97.4 F (36.3 C)-99.1 F (37.3 C)] 99.1 F (37.3 C) (09/30 1955) Pulse Rate:  [68-86] 68 (10/01 0506) Resp:  [16-19] 18 (10/01 0506) BP: (126-177)/(57-88) 177/88 (10/01 0506) SpO2:  [91 %-100 %] 95 % (10/01 0506) Weight:  [54 kg] 54 kg (09/30 0818) Physical Exam: Physical Exam Vitals and nursing note reviewed. Exam conducted with a chaperone present.  Cardiovascular:     Rate and Rhythm: Normal rate and regular rhythm.     Heart sounds: No murmur heard.    No gallop.  Pulmonary:     Effort: Pulmonary effort is normal. No respiratory distress.  Abdominal:     Palpations: Abdomen is soft.  Neurological:     Mental Status: She is disoriented.     GCS: GCS eye subscore is 2. GCS verbal subscore is 2. GCS motor subscore is 6.     Cranial Nerves: Cranial nerves 2-12 are intact.     Comments: Follows commands with  all extremeties      Laboratory: Most recent CBC Lab Results  Component Value Date   WBC 7.1 05/01/2024   HGB 14.1 05/01/2024    HCT 41.2 05/01/2024   MCV 91.6 05/01/2024   PLT 200 05/01/2024   Most recent BMP    Latest Ref Rng & Units 05/01/2024    4:27 AM  BMP  Glucose 70 - 99 mg/dL 96   BUN 8 - 23 mg/dL 15   Creatinine 9.55 - 1.00 mg/dL 9.19   Sodium 864 - 854 mmol/L 139   Potassium 3.5 - 5.1 mmol/L 3.6   Chloride 98 - 111 mmol/L 101   CO2 22 - 32 mmol/L 25   Calcium 8.9 - 10.3 mg/dL 9.1     Other pertinent labs: Urine culture contains Klebsiella Pneumoniae, susceptibilities pending  Imaging/Diagnostic Tests: Radiologist Impression: No results found. My interpretation: n/a Lera Nancyann KATHEE ROSALEA 05/02/2024, 8:06 AM  PGY-1, [facility] FPTS Intern pager: [phone], text pages welcome Secure chat group [facility] Teaching Service

## 2024-05-02 DEATH — deceased

## 2024-05-03 DIAGNOSIS — H409 Unspecified glaucoma: Secondary | ICD-10-CM | POA: Diagnosis present

## 2024-05-03 DIAGNOSIS — R4182 Altered mental status, unspecified: Secondary | ICD-10-CM

## 2024-05-03 DIAGNOSIS — M160 Bilateral primary osteoarthritis of hip: Secondary | ICD-10-CM | POA: Diagnosis present

## 2024-05-03 DIAGNOSIS — Z832 Family history of diseases of the blood and blood-forming organs and certain disorders involving the immune mechanism: Secondary | ICD-10-CM | POA: Diagnosis not present

## 2024-05-03 DIAGNOSIS — Z515 Encounter for palliative care: Secondary | ICD-10-CM | POA: Diagnosis not present

## 2024-05-03 DIAGNOSIS — N3000 Acute cystitis without hematuria: Secondary | ICD-10-CM | POA: Diagnosis not present

## 2024-05-03 DIAGNOSIS — E0590 Thyrotoxicosis, unspecified without thyrotoxic crisis or storm: Secondary | ICD-10-CM

## 2024-05-03 DIAGNOSIS — M810 Age-related osteoporosis without current pathological fracture: Secondary | ICD-10-CM | POA: Diagnosis present

## 2024-05-03 DIAGNOSIS — F03918 Unspecified dementia, unspecified severity, with other behavioral disturbance: Secondary | ICD-10-CM | POA: Diagnosis not present

## 2024-05-03 DIAGNOSIS — E440 Moderate protein-calorie malnutrition: Secondary | ICD-10-CM

## 2024-05-03 DIAGNOSIS — F03911 Unspecified dementia, unspecified severity, with agitation: Secondary | ICD-10-CM | POA: Diagnosis present

## 2024-05-03 DIAGNOSIS — N390 Urinary tract infection, site not specified: Secondary | ICD-10-CM | POA: Diagnosis present

## 2024-05-03 DIAGNOSIS — B961 Klebsiella pneumoniae [K. pneumoniae] as the cause of diseases classified elsewhere: Secondary | ICD-10-CM | POA: Diagnosis present

## 2024-05-03 DIAGNOSIS — I872 Venous insufficiency (chronic) (peripheral): Secondary | ICD-10-CM | POA: Diagnosis present

## 2024-05-03 DIAGNOSIS — E785 Hyperlipidemia, unspecified: Secondary | ICD-10-CM | POA: Diagnosis present

## 2024-05-03 DIAGNOSIS — R410 Disorientation, unspecified: Secondary | ICD-10-CM | POA: Diagnosis present

## 2024-05-03 DIAGNOSIS — Z7189 Other specified counseling: Secondary | ICD-10-CM | POA: Diagnosis not present

## 2024-05-03 DIAGNOSIS — F05 Delirium due to known physiological condition: Secondary | ICD-10-CM | POA: Diagnosis present

## 2024-05-03 DIAGNOSIS — Z79899 Other long term (current) drug therapy: Secondary | ICD-10-CM | POA: Diagnosis not present

## 2024-05-03 DIAGNOSIS — I10 Essential (primary) hypertension: Secondary | ICD-10-CM | POA: Diagnosis present

## 2024-05-03 DIAGNOSIS — R4689 Other symptoms and signs involving appearance and behavior: Secondary | ICD-10-CM | POA: Diagnosis not present

## 2024-05-03 DIAGNOSIS — J90 Pleural effusion, not elsewhere classified: Secondary | ICD-10-CM | POA: Diagnosis present

## 2024-05-03 DIAGNOSIS — Z7983 Long term (current) use of bisphosphonates: Secondary | ICD-10-CM | POA: Diagnosis not present

## 2024-05-03 DIAGNOSIS — Z66 Do not resuscitate: Secondary | ICD-10-CM | POA: Diagnosis present

## 2024-05-03 DIAGNOSIS — Z6821 Body mass index (BMI) 21.0-21.9, adult: Secondary | ICD-10-CM | POA: Diagnosis not present

## 2024-05-03 LAB — BASIC METABOLIC PANEL WITH GFR
Anion gap: 9 (ref 5–15)
BUN: 19 mg/dL (ref 8–23)
CO2: 25 mmol/L (ref 22–32)
Calcium: 8.9 mg/dL (ref 8.9–10.3)
Chloride: 104 mmol/L (ref 98–111)
Creatinine, Ser: 0.88 mg/dL (ref 0.44–1.00)
GFR, Estimated: >60 mL/min (ref >60)
Glucose, Bld: 132 mg/dL — ABNORMAL HIGH (ref 70–99)
Potassium: 3.4 mmol/L — ABNORMAL LOW (ref 3.5–5.1)
Sodium: 138 mmol/L (ref 135–145)

## 2024-05-03 LAB — CBC
HCT: 41.5 % (ref 36.0–46.0)
Hemoglobin: 14.3 g/dL (ref 12.0–15.0)
MCH: 31.7 pg (ref 26.0–34.0)
MCHC: 34.5 g/dL (ref 30.0–36.0)
MCV: 92 fL (ref 80.0–100.0)
Platelets: 200 K/uL (ref 150–400)
RBC: 4.51 MIL/uL (ref 3.87–5.11)
RDW: 12 % (ref 11.5–15.5)
WBC: 8.9 K/uL (ref 4.0–10.5)
nRBC: 0 % (ref 0.0–0.2)

## 2024-05-03 LAB — T4, FREE: Free T4: 1.27 ng/dL — ABNORMAL HIGH (ref 0.61–1.12)

## 2024-05-03 LAB — URINE CULTURE
Culture: >=100000 — AB
Culture: >=100000 — AB

## 2024-05-03 MED ORDER — POTASSIUM CHLORIDE 10 MEQ/100ML IV SOLN
10.0000 meq | INTRAVENOUS | Status: AC
  Administered 2024-05-03 (×2): 10 meq via INTRAVENOUS
  Filled 2024-05-03 (×2): qty 100

## 2024-05-03 MED ORDER — CEFAZOLIN SODIUM-DEXTROSE 1-4 GM/50ML-% IV SOLN
1.0000 g | Freq: Three times a day (TID) | INTRAVENOUS | Status: AC
  Administered 2024-05-03 – 2024-05-04 (×5): 1 g via INTRAVENOUS
  Filled 2024-05-03 (×6): qty 50

## 2024-05-03 NOTE — Assessment & Plan Note (Addendum)
 CT chest: R sided effusion possibly hemothorax. CXR: L sided loculated effusion from atelectasis or pneumonia. - Continue antibiotics for UTI - Family conference call this afternoon to determine goals of care.

## 2024-05-03 NOTE — Plan of Care (Signed)
   Problem: Education: Goal: Knowledge of General Education information will improve Description Including pain rating scale, medication(s)/side effects and non-pharmacologic comfort measures Outcome: Progressing

## 2024-05-03 NOTE — Assessment & Plan Note (Addendum)
 Patient with ongoing AMS. Can follow commands to squeeze and release fingers from both hands. Likely acute delirium secondary to urinary tract infection on top of declining dementia. - Continue antibiotics with IV ceftriaxone (9/29-10/3) - Urine culture shows Klebsiella Pneumoniae, susceptibilities pending - D5 LR infusion 75 mL/hr for 12 hours  - AM labs: CBC, BMP, free T4 - Fall precautions, Delirium precautions in place - If patient becomes more agitated and nonredirectable, can consider spot dosing Haldol - Can also consider mittens if patient tries to remove lines and is not redirectable - Consider psych/geropsych workup (likely outpatient) if symptoms do not improve with antibiotics - Consider brain MRI if worsening AMS and/or develops neuro-deficits - PT/OT have seen patient and signed off - SLP consulted for swallow evaluation

## 2024-05-03 NOTE — Progress Notes (Signed)
 Speech Language Pathology Treatment: Dysphagia  Patient Details Name: Amanda Shaw MRN: [id number] DOB: 1930-01-13 Today's Date: 05/03/2024 Time: 0832-0900 SLP Time Calculation (min) (ACUTE ONLY): 28 min  Assessment / Plan / Recommendation Clinical Impression  Returned to reassess pt and determine readiness for POs.  After washing face, providing oral care, she alerted and was able to participate marginally.  She was holding large quantity of frothy secretions in her mouth; suctioning removed what was accessible, but secretions in pharynx were still audible and pt couldn't be cued to cough or swallow them.  Teaspoons of water led to immediate, explosive coughing and expectorating water/secretions.    Pt was able to state her name, DOB, location, month/year. Kept her eyes closed and needed max cues to participate.  Not alert enough to eat/drink.  Rec NPO for now; she won't be able to take any oral meds given current mental status.  D/W Dr. Rumball.  SLP will follow.    HPI HPI: Patient is a 89 year old female presenting with AMS and increased urinary frequency found to have UTI. Pertinent PMH/PSH includes dementia, HTN, HLD, glaucoma, osteoporosis.  SLP consulted for swallow evaluation to better guide medication management.      SLP Plan  Continue with current plan of care          Recommendations  Diet recommendations: NPO Medication Administration: Via alternative means                  Oral care QID    (tba) Dysphagia, unspecified (R13.10)     Continue with current plan of care    Eleonore Shippee L. Vona, MA CCC/SLP Clinical Specialist - Acute Care SLP Acute Rehabilitation Services Office number [phone]  [name]  05/03/2024, 9:01 AM

## 2024-05-03 NOTE — Assessment & Plan Note (Signed)
 HTN: Restart home HCTZ 12.5 mg daily in setting of low-normal BPs. HLD: Continue home simvastatin  20 mg daily Glaucoma: Continue home latanoprost 1 drop both eyes daily Dementia: Hold home donepezil  5 mg daily

## 2024-05-03 NOTE — Plan of Care (Signed)
 FMTS Interim Progress Note  Spoke with multiple family members including patient's medical power of attorney and son Marcey via phone conference at planned family meeting.  Discussed patient's hospital course and overall health trajectory over the past year given her prior admission on our service in 11/2023.  Discussed current hospital treatment and unfortunately lack of improvement in mental status including inability to take anything by mouth.  Family agreeable that they want patient to be comfortable and to optimize supportive care for her.  Ultimately decided to complete the antibiotic course for her UTI and at that time if there is no improvement plan to transition to hospice services.  Discussed inpatient versus outpatient hospice care, will reevaluate at that time for appropriateness.  At this time, we will not pursue any artificial nutrition or fluids.  Will not pursue any further medical workup and focus on supporting patient's symptoms as they wax and wane over time.  Theophilus Pagan, MD 05/03/2024, 3:39 PM PGY-3, [facility] Family Medicine Service pager [phone]

## 2024-05-03 NOTE — Assessment & Plan Note (Signed)
 TSH of 0.157, stable from 0.1875 months ago. - No further intervention indicated inpatient - Consider further management outpatient

## 2024-05-03 NOTE — Progress Notes (Signed)
 Daily Progress Note Intern Pager: [phone]  Patient name: Amanda Shaw Medical record number: [id number] Date of birth: [birth year] Age: 89 y.o. Gender: female  Primary Care Provider: Donah Laymon PARAS, MD Consultants: None Code Status: DNR/DNI  Pt Overview and Major Events to Date:  9/29 -Admitted   Assessment and Plan:   Patient is a 89 year old female presenting with AMS and increased urinary frequency found to have UTI. Pertinent PMH/PSH includes dementia, HTN, HLD, glaucoma, osteoporosis.   Her current mental status is significantly worsened from baseline. Immediatly prior to admission, she was running into the street. Goals of care conversation to be had this afternoon between 3pm and 4pm with family conference call which will guide much of her future care. Assessment & Plan UTI (urinary tract infection) AMS (altered mental status) Patient with ongoing AMS. Can follow commands to squeeze and release fingers from both hands. Likely acute delirium secondary to urinary tract infection on top of declining dementia. - Continue antibiotics with IV ceftriaxone (9/29-10/3) - Urine culture shows Klebsiella Pneumoniae, susceptibilities pending - D5 LR infusion 75 mL/hr for 12 hours  - AM labs: CBC, BMP, free T4 - Fall precautions, Delirium precautions in place - If patient becomes more agitated and nonredirectable, can consider spot dosing Haldol - Can also consider mittens if patient tries to remove lines and is not redirectable - Consider psych/geropsych workup (likely outpatient) if symptoms do not improve with antibiotics - Consider brain MRI if worsening AMS and/or develops neuro-deficits - PT/OT have seen patient and signed off - SLP consulted for swallow evaluation Subclinical hyperthyroidism TSH of 0.157, stable from 0.1875 months ago. - No further intervention indicated inpatient - Consider further management outpatient Chronic health problem HTN: Restart  home HCTZ 12.5 mg daily in setting of low-normal BPs. HLD: Continue home simvastatin  20 mg daily Glaucoma: Continue home latanoprost 1 drop both eyes daily Dementia: Hold home donepezil  5 mg daily Edema of left lower extremity Left lower leg with mild nonpitting edema, more so than right side. Ultrasound negative for left-sided DVT.  X-ray of left ankle without fracture or dislocation, with moderate soft tissue swelling.  No history of heart failure.  Patient with known history of venous insufficiency, consider this as cause of edema. - No further workup indicated Malnutrition of moderate degree - Started Ensure supplementation BID by Dietician Pleural effusion, bilateral CT chest: R sided effusion possibly hemothorax. CXR: L sided loculated effusion from atelectasis or pneumonia. - Continue antibiotics for UTI - Family conference call this afternoon to determine goals of care.   FEN/GI: NPO per speech PPx: Lovenox  (not received) Dispo:Location TBD based on goals of care. Timeline pending clinical improvement . Barriers include AMS.   Subjective:   Vital Signs Stable. Pt not verbally responsive, but follows commands to squeeze fingers.   Son called to organize a family conference call to clarify pt baseline, goals of care, and provide update on unfortunate patient status.   Objective: Temp:  [97.5 F (36.4 C)-98.7 F (37.1 C)] 97.8 F (36.6 C) (10/02 0738) Pulse Rate:  [78-93] 90 (10/02 0738) Resp:  [17-22] 20 (10/02 0559) BP: (97-159)/(50-132) 97/81 (10/02 0738) SpO2:  [94 %-99 %] 94 % (10/02 9261) Physical Exam Vitals and nursing note reviewed. Exam conducted with a chaperone present.  Constitutional:      General: She is not in acute distress. Pulmonary:     Effort: Tachypnea present.     Breath sounds: Normal breath sounds.  Laboratory: Most recent CBC Lab Results  Component Value Date   WBC 8.9 05/03/2024   HGB 14.3 05/03/2024   HCT 41.5 05/03/2024   MCV  92.0 05/03/2024   PLT 200 05/03/2024   Most recent BMP    Latest Ref Rng & Units 05/03/2024    5:40 AM  BMP  Glucose 70 - 99 mg/dL 867   BUN 8 - 23 mg/dL 19   Creatinine 9.55 - 1.00 mg/dL 9.11   Sodium 864 - 854 mmol/L 138   Potassium 3.5 - 5.1 mmol/L 3.4   Chloride 98 - 111 mmol/L 104   CO2 22 - 32 mmol/L 25   Calcium 8.9 - 10.3 mg/dL 8.9     Other pertinent labs: T4 at 1.27  Imaging/Diagnostic Tests: Radiologist Impression: CT CHEST WO CONTRAST Result Date: 05/03/2024 EXAM: CT CHEST WITHOUT CONTRAST 05/02/2024 11:54:17 PM TECHNIQUE: CT of the chest was performed without the administration of intravenous contrast. Multiplanar reformatted images are provided for review. Automated exposure control, iterative reconstruction, and/or weight based adjustment of the mA/kV was utilized to reduce the radiation dose to as low as reasonably achievable. COMPARISON: Right same day chest radiograph. CLINICAL HISTORY: tachypnea; loculated left effusion. FINDINGS: MEDIASTINUM: Coronary artery and aortic atherosclerotic calcification. Heart and pericardium are otherwise unremarkable. The central airways are clear. LYMPH NODES: No mediastinal, hilar or axillary lymphadenopathy. LUNGS AND PLEURA: Small right pleural effusion. This may be a small hemothorax given adjacent fractures. No focal consolidation or pulmonary edema. No pneumothorax. SOFT TISSUES/BONES: Mildly displaced fracture of the right posterior 9th-10th ribs. UPPER ABDOMEN: Limited images of the upper abdomen demonstrates no acute abnormality. IMPRESSION: 1. Small right pleural effusion, possibly a small hemothorax given adjacent fractures. 2. Mildly displaced fractures of the right posterior 9th-10th ribs. Electronically signed by: Norman Gatlin MD 05/03/2024 12:02 AM EDT RP Workstation: [id number]   DG CHEST PORT 1 VIEW Result Date: 05/02/2024 CLINICAL DATA:  Tachypnea EXAM: PORTABLE CHEST 1 VIEW COMPARISON:  04/30/2024, 04/09/2013 FINDINGS:  Similar small slightly loculated appearing left effusion with airspace disease at the left base. Stable cardiomediastinal silhouette with aortic atherosclerosis. No pneumothorax IMPRESSION: Similar small slightly loculated appearing left effusion with airspace disease at the left base which may be from atelectasis or pneumonia. Electronically Signed   By: Luke Bun M.D.   On: 05/02/2024 22:51   MR BRAIN WO CONTRAST Result Date: 05/02/2024 EXAM: MRI BRAIN WITHOUT CONTRAST 05/02/2024 07:49:02 PM TECHNIQUE: Multiplanar multisequence MRI of the head/brain was performed without the administration of intravenous contrast. COMPARISON: 11/11/2023 CLINICAL HISTORY: Mental status change, unknown cause. Per family, patient acting different from baseline over past 3 to 4 weeks, and especially agitated and aggressive over the past couple of days. FINDINGS: BRAIN AND VENTRICLES: No acute infarct. No intracranial hemorrhage. No mass. No midline shift. No hydrocephalus. Mild volume loss, most notable in the occipital lobes and posterior parietal lobes, but the pattern is unchanged. Multifocal hyperintense T2-weighted signal within the cerebral white matter, most commonly due to chronic small vessel disease. The sella is unremarkable. Normal flow voids. ORBITS: No acute abnormality. SINUSES AND MASTOIDS: No acute abnormality. BONES AND SOFT TISSUES: Normal marrow signal. No acute soft tissue abnormality. IMPRESSION: 1. No acute intracranial abnormality. 2. Mild volume loss, most notable in the occipital and posterior parietal lobes, unchanged from prior. 3. Multifocal T2 hyperintense signal within the cerebral white matter, most commonly due to chronic small vessel disease. Electronically signed by: Franky Stanford MD 05/02/2024 08:58 PM EDT RP Workstation: [id number]  Summary:  CT chest: Small R pleural effusion, possibly from small hemothorax w/ adjacent fractures R Posterior rips 9&10. CXR: Loculated L effusion from  either atelectasis or pneumonia MRI brain: No acute intracranial abnormality. Chronic small vessel disease.   Amanda Nancyann NOVAK, DO 05/03/2024, 7:41 AM  PGY-1, [facility] Family Medicine FPTS Intern pager: [phone], text pages welcome Secure chat group [facility] Teaching Service

## 2024-05-03 NOTE — Assessment & Plan Note (Signed)
 Left lower leg with mild nonpitting edema, more so than right side. Ultrasound negative for left-sided DVT.  X-ray of left ankle without fracture or dislocation, with moderate soft tissue swelling.  No history of heart failure.  Patient with known history of venous insufficiency, consider this as cause of edema. - No further workup indicated

## 2024-05-03 NOTE — Plan of Care (Signed)

## 2024-05-03 NOTE — Assessment & Plan Note (Signed)
-   Started Ensure supplementation BID by [employer]

## 2024-05-04 DIAGNOSIS — R4182 Altered mental status, unspecified: Secondary | ICD-10-CM | POA: Diagnosis not present

## 2024-05-04 DIAGNOSIS — R4689 Other symptoms and signs involving appearance and behavior: Secondary | ICD-10-CM | POA: Diagnosis not present

## 2024-05-04 DIAGNOSIS — N3000 Acute cystitis without hematuria: Secondary | ICD-10-CM | POA: Diagnosis not present

## 2024-05-04 DIAGNOSIS — E440 Moderate protein-calorie malnutrition: Secondary | ICD-10-CM | POA: Diagnosis not present

## 2024-05-04 MED ORDER — HALOPERIDOL LACTATE 5 MG/ML IJ SOLN
5.0000 mg | Freq: Once | INTRAMUSCULAR | Status: AC
  Administered 2024-05-04: 5 mg via INTRAVENOUS
  Filled 2024-05-04: qty 1

## 2024-05-04 NOTE — Assessment & Plan Note (Signed)
-   Started Ensure supplementation BID by [employer]

## 2024-05-04 NOTE — Progress Notes (Addendum)
 Patient very agitated, trying to hit staff, attempting to remove equipment and shouting. Provider was notified and came at bedside to assess the patient. Haldol IV was ordered and administered to the patient to help to calm her down. Will continue to monitor.

## 2024-05-04 NOTE — Assessment & Plan Note (Signed)
 HTN: Restart home HCTZ 12.5 mg daily in setting of low-normal BPs. HLD: Continue home simvastatin  20 mg daily Glaucoma: Continue home latanoprost 1 drop both eyes daily Dementia: Hold home donepezil  5 mg daily Subclinical Hyperthyroidism: further management outpatient  Moderate Malnutrition: continue Ensure BID

## 2024-05-04 NOTE — Progress Notes (Signed)
 Daily Progress Note Intern Pager: [phone]  Patient name: Amanda Shaw Medical record number: [id number] Date of birth: 09/15/1929 Age: 89 y.o. Gender: female  Primary Care Provider: Donah Laymon PARAS, MD Consultants: None Code Status: DNR/DNI  Pt Overview and Major Events to Date:  9/29 - Admitted  Assessment and Plan:  Patient is a 89 year old female presenting with AMS and increased urinary frequency found to have UTI. Pertinent PMH/PSH includes dementia, HTN, HLD, glaucoma, osteoporosis.   Per goals of care conversation with family, they would like to move forward with consulting the palliative team for comfort options. Last night having some urinary retention, in and out cath removed . After discussion with family, palliative consulted. Her improving mental status may now allow for oral intake, pending SLP. Assessment & Plan UTI (urinary tract infection) AMS (altered mental status) Patient with ongoing AMS. Can follow commands to squeeze and release fingers from both hands. Likely acute delirium secondary to urinary tract infection on top of declining dementia. Waxes and wanes throughout the day. - Continue antibiotics with IV cefazolin  (9/29-10/3) - Urine culture shows Klebsiella Pneumoniae, susceptible to given antibotics - D5 LR infusion 75 mL/hr for 12 hours  - AM labs: CBC, BMP, free T4 - Fall precautions, Delirium precautions in place - If patient becomes more agitated and nonredirectable, can consider spot dosing Haldol - Can also consider mittens if patient tries to remove lines and is not redirectable - Consider psych/geropsych workup (likely outpatient) if symptoms do not improve with antibiotics - Consider brain MRI if worsening AMS and/or develops neuro-deficits - PT/OT have seen patient and signed off - SLP consulted for swallow evaluation Subclinical hyperthyroidism TSH of 0.157, stable from 0.1875 months ago. - No further intervention  indicated inpatient - Consider further management outpatient Chronic health problem HTN: Restart home HCTZ 12.5 mg daily in setting of low-normal BPs. HLD: Continue home simvastatin  20 mg daily Glaucoma: Continue home latanoprost 1 drop both eyes daily Dementia: Hold home donepezil  5 mg daily Edema of left lower extremity Left lower leg with mild nonpitting edema, more so than right side. Ultrasound negative for left-sided DVT.  X-ray of left ankle without fracture or dislocation, with moderate soft tissue swelling.  No history of heart failure.  Patient with known history of venous insufficiency, consider this as cause of edema. - No further workup indicated Malnutrition of moderate degree - Started Ensure supplementation BID by Dietician Pleural effusion, bilateral CT chest: R sided effusion possibly hemothorax. CXR: L sided loculated effusion from atelectasis or pneumonia. - Continue antibiotics for UTI - Family conference call this afternoon to determine goals of care.   FEN/GI: NPO PPx: Lovenox  Dispo:Location TBD, likely palliative consult for hospice eval.   Subjective:   Vital signs stable with slowly increasing MAP. Pt unable to take PO medications. Received Lovenox . Pt today appeared improved from yesterday. Appears to wax and wane throughout the day. Removed IV and required replacement.  Spoke with Elspeth [facility]) and Jamee (son and daughter) today about prognosis. They would like to meet with the palliative team to discuss future options.  Objective: Temp:  [98 F (36.7 C)-98.3 F (36.8 C)] 98.3 F (36.8 C) (10/03 0421) Pulse Rate:  [84-95] 84 (10/03 0421) BP: (110-155)/(63-87) 155/77 (10/03 0421) SpO2:  [90 %-98 %] 95 % (10/03 0421)  Physical Exam Vitals and nursing note reviewed. Exam conducted with a chaperone present.  Constitutional:      General: She is not in acute distress.  Appearance: She is normal weight.  Cardiovascular:     Rate and Rhythm: Normal  rate.     Heart sounds: No murmur heard. Pulmonary:     Effort: Pulmonary effort is normal. No respiratory distress.  Skin:    General: Skin is warm and dry.  Neurological:     Mental Status: She is disoriented and confused.     Comments: Responsiveness waxes and wanes throughout the day. Verbal GCS between 4-5, Motor always 6. Eye varies 1-4.       Laboratory: Most recent CBC Lab Results  Component Value Date   WBC 8.9 05/03/2024   HGB 14.3 05/03/2024   HCT 41.5 05/03/2024   MCV 92.0 05/03/2024   PLT 200 05/03/2024   Most recent BMP    Latest Ref Rng & Units 05/03/2024    5:40 AM  BMP  Glucose 70 - 99 mg/dL 867   BUN 8 - 23 mg/dL 19   Creatinine 9.55 - 1.00 mg/dL 9.11   Sodium 864 - 854 mmol/L 138   Potassium 3.5 - 5.1 mmol/L 3.4   Chloride 98 - 111 mmol/L 104   CO2 22 - 32 mmol/L 25   Calcium 8.9 - 10.3 mg/dL 8.9     Other pertinent labs: Susceptibilities returned. Sensitive to all except Ampicillin, continue current course.  Imaging/Diagnostic Tests: Radiologist Impression: No results found. My interpretation: n/a Lera Nancyann KATHEE ROSALEA 05/04/2024, 7:43 AM  PGY-1, [facility] Family Medicine FPTS Intern pager: [phone], text pages welcome Secure chat group [facility] Teaching Service

## 2024-05-04 NOTE — Plan of Care (Signed)
 FMTS Interim Progress Note  Received message from nurse that patient was agitated, pulling at her IV and trying to hit staff.   On evaluation she appeared agitated, was calling out and moving around in bed. Unable to be redirected.   O: BP (!) 167/72 (BP Location: Right Leg)   Pulse 90   Temp 98.3 F (36.8 C) (Oral)   Resp 20   Ht 5' 3 (1.6 m)   Wt 54 kg   SpO2 95%   BMI 21.08 kg/m   General: Chronically ill-appearing, no acute distress Cardio: RRR, no murmur on exam Pulm: clear, No increased work of breathing  A/P: Agitation:  Patient is at risk to harm herself. Has tried to hit the nurse and nurse tech  Ordered 5 mg Haldol Will reassess in 30-60 minutes if agitation not improved will re-dose haldol  GOC continue to be treat the treatable and maintain patient's comfort   Cleotilde Perkins, DO 05/04/2024, 8:33 PM PGY-3, [facility] Family Medicine Service pager [phone]

## 2024-05-04 NOTE — Progress Notes (Signed)
     Daily Progress Note Intern Pager: [phone]  Patient name: Amanda Shaw Medical record number: [id number] Date of birth: 12-Oct-1929 Age: 89 y.o. Gender: female  Primary Care Provider: Donah Laymon PARAS, MD Consultants: Palliative Care Code Status: DNR/DNI  Pt Overview and Major Events to Date:  9/29: Admitted   Assessment and Plan:  Amanda Shaw is a 89 y.o. female admitted for AMS likely secondary to UTI. Now moving towards GOC discussions and potential transition to hospice pending family discussion.   Agitated overnight. Received haldol x1 with resolution of agitation.   Pertinent PMH/PSH includes dementia, HTN, HLD, glaucoma, osteoporosis.  Assessment & Plan UTI (urinary tract infection) AMS (altered mental status) Worse overnight. Urine culture positive for Klebsiella, sensitive to cephalosporin.  - Antibiotics: Ceftriaxone (9/29-10/1) and Cefazolin  (10/2-10/3) to complete 5 day course  - Haldol PRN for agitation that is not redirectable  - GOC discussion ongoing, patient would qualify for hospice and comfort care - Lab holiday  - Bladder scans as needed if suspect urinary retention  Chronic health problem HTN: Restart home HCTZ 12.5 mg daily in setting of low-normal BPs. HLD: Continue home simvastatin  20 mg daily Glaucoma: Continue home latanoprost 1 drop both eyes daily Dementia: Hold home donepezil  5 mg daily Subclinical Hyperthyroidism: further management outpatient  Moderate Malnutrition: continue Ensure BID   FEN/GI: Dysphagia 3 PPx: Lovenox  Dispo:pending GOC discussion in 2-3 days. Barriers include family goals of care meeting.   Subjective:  Agitated overnight. Not redirectable on exam, resting comfortably this morning   Objective: Temp:  [98.3 F (36.8 C)-98.4 F (36.9 C)] 98.3 F (36.8 C) (10/03 2021) Pulse Rate:  [76-90] 90 (10/03 2021) Resp:  [18-20] 20 (10/03 2021) BP: (104-167)/(54-95) 167/72 (10/03 2021) SpO2:  [88 %-95  %] 95 % (10/03 2021) Physical Exam: General: Chronically ill-appearing, no acute distress Cardio: RRR, no murmur on exam Pulm: clear, No increased work of breathing Abdomen: Soft, bowel sounds present, nontender Extremity: No peripheral edema   Laboratory: Most recent CBC Lab Results  Component Value Date   WBC 8.9 05/03/2024   HGB 14.3 05/03/2024   HCT 41.5 05/03/2024   MCV 92.0 05/03/2024   PLT 200 05/03/2024   Most recent BMP    Latest Ref Rng & Units 05/03/2024    5:40 AM  BMP  Glucose 70 - 99 mg/dL 867   BUN 8 - 23 mg/dL 19   Creatinine 9.55 - 1.00 mg/dL 9.11   Sodium 864 - 854 mmol/L 138   Potassium 3.5 - 5.1 mmol/L 3.4   Chloride 98 - 111 mmol/L 104   CO2 22 - 32 mmol/L 25   Calcium 8.9 - 10.3 mg/dL 8.9    Imaging/Diagnostic Tests: No new labs or imaging   Amanda Perkins, DO 05/04/2024, 9:43 PM  PGY-3, [place] [facility] FPTS Intern pager: [phone], text pages welcome Secure chat group [facility] Teaching Service

## 2024-05-04 NOTE — Assessment & Plan Note (Signed)
 Left lower leg with mild nonpitting edema, more so than right side. Ultrasound negative for left-sided DVT.  X-ray of left ankle without fracture or dislocation, with moderate soft tissue swelling.  No history of heart failure.  Patient with known history of venous insufficiency, consider this as cause of edema. - No further workup indicated

## 2024-05-04 NOTE — Assessment & Plan Note (Signed)
 HTN: Restart home HCTZ 12.5 mg daily in setting of low-normal BPs. HLD: Continue home simvastatin  20 mg daily Glaucoma: Continue home latanoprost 1 drop both eyes daily Dementia: Hold home donepezil  5 mg daily

## 2024-05-04 NOTE — Progress Notes (Signed)
 Speech Language Pathology Treatment: Dysphagia  Patient Details Name: Amanda Shaw MRN: [id number] DOB: August 26, 1929 Today's Date: 05/04/2024 Time: 0950-1010 SLP Time Calculation (min) (ACUTE ONLY): 20 min  Assessment / Plan / Recommendation Clinical Impression  Amanda Shaw was more alert and talkative today. Confused, undressing herself, not very receptive to PO intake. She assisted with her own oral care.  Accepted sips of water, keeping her head in extension in a way that creates opportunity for spillage into airway.  Tactile/verbal prompts to shift positioning were resisted. She allowed a single bite of yogurt, then pushed examiner's hand away and said no more.   Recommend resuming an oral diet; primary barrier will likely be her lack of desire, decreased willingness to eat/drink or accept help from others.  Rec starting dysphagia 3/thin liquids - will need 1:1 assist to eat. Hold tray if she is not alert or attentive to POs and do not leave alone with tray.  SLP will follow along for plan/goals of care.   HPI HPI: Patient is a 89 year old female presenting with AMS and increased urinary frequency found to have UTI. Pertinent PMH/PSH includes dementia, HTN, HLD, glaucoma, osteoporosis.  SLP consulted for swallow evaluation to better guide medication management.      SLP Plan  Continue with current plan of care          Recommendations  Diet recommendations: Dysphagia 3;Thin liquid Liquids provided via: Cup;Straw Medication Administration: Crushed with puree Supervision: Staff to assist with self feeding Compensations: Minimize environmental distractions Postural Changes and/or Swallow Maneuvers: Seated upright 90 degrees                  Oral care BID     Dysphagia, unspecified (R13.10)     Continue with current plan of care   [name], MA CCC/SLP Clinical Specialist - Acute Care SLP Acute Rehabilitation Services Office number  [phone]   [name]  05/04/2024, 10:22 AM

## 2024-05-04 NOTE — Assessment & Plan Note (Addendum)
 Worse overnight. Urine culture positive for Klebsiella, sensitive to cephalosporin.  - Antibiotics: Ceftriaxone (9/29-10/1) and Cefazolin  (10/2-10/3) to complete 5 day course  - Haldol PRN for agitation that is not redirectable  - GOC discussion ongoing, patient would qualify for hospice and comfort care - Lab holiday  - Bladder scans as needed if suspect urinary retention

## 2024-05-04 NOTE — Assessment & Plan Note (Addendum)
 Patient with ongoing AMS. Can follow commands to squeeze and release fingers from both hands. Likely acute delirium secondary to urinary tract infection on top of declining dementia. Waxes and wanes throughout the day. - Continue antibiotics with IV cefazolin  (9/29-10/3) - Urine culture shows Klebsiella Pneumoniae, susceptible to given antibotics - D5 LR infusion 75 mL/hr for 12 hours  - AM labs: CBC, BMP, free T4 - Fall precautions, Delirium precautions in place - If patient becomes more agitated and nonredirectable, can consider spot dosing Haldol - Can also consider mittens if patient tries to remove lines and is not redirectable - Consider psych/geropsych workup (likely outpatient) if symptoms do not improve with antibiotics - Consider brain MRI if worsening AMS and/or develops neuro-deficits - PT/OT have seen patient and signed off - SLP consulted for swallow evaluation

## 2024-05-04 NOTE — Progress Notes (Signed)
 Patient due to void since the beginning of the shift. Checked the patient to see if she was dry and she was. I assessed the patient's lower abdomen and it was distended. When was pressed, the patient started to complain of pain and pressure. Attempted to bladder scan the patient with no success. Patient was then straight cath and 400 ml of urine were obtained. Patient stated that she feels relief after the intervention. Provider was notified. Will continue to monitor.

## 2024-05-04 NOTE — Assessment & Plan Note (Signed)
 CT chest: R sided effusion possibly hemothorax. CXR: L sided loculated effusion from atelectasis or pneumonia. - Continue antibiotics for UTI - Family conference call this afternoon to determine goals of care.

## 2024-05-04 NOTE — Assessment & Plan Note (Signed)
 TSH of 0.157, stable from 0.1875 months ago. - No further intervention indicated inpatient - Consider further management outpatient

## 2024-05-04 NOTE — Plan of Care (Signed)
   Problem: Activity: Goal: Risk for activity intolerance will decrease Outcome: Progressing   Problem: Nutrition: Goal: Adequate nutrition will be maintained Outcome: Progressing   Problem: Coping: Goal: Level of anxiety will decrease Outcome: Progressing   Problem: Elimination: Goal: Will not experience complications related to bowel motility Outcome: Progressing Goal: Will not experience complications related to urinary retention Outcome: Progressing

## 2024-05-05 DIAGNOSIS — R4689 Other symptoms and signs involving appearance and behavior: Secondary | ICD-10-CM | POA: Diagnosis not present

## 2024-05-05 DIAGNOSIS — Z515 Encounter for palliative care: Secondary | ICD-10-CM | POA: Diagnosis not present

## 2024-05-05 DIAGNOSIS — Z7189 Other specified counseling: Secondary | ICD-10-CM | POA: Diagnosis not present

## 2024-05-05 DIAGNOSIS — E440 Moderate protein-calorie malnutrition: Secondary | ICD-10-CM | POA: Diagnosis not present

## 2024-05-05 DIAGNOSIS — N390 Urinary tract infection, site not specified: Secondary | ICD-10-CM | POA: Diagnosis not present

## 2024-05-05 DIAGNOSIS — N3000 Acute cystitis without hematuria: Secondary | ICD-10-CM | POA: Diagnosis not present

## 2024-05-05 NOTE — Plan of Care (Signed)

## 2024-05-05 NOTE — Consult Note (Signed)
 Palliative Care Consult Note                                  Date: 05/05/2024   Patient Name: Amanda Shaw  DOB: 04-Sep-1929  MRN: [id number]  Age / Sex: 89 y.o., female  PCP: Donah Laymon PARAS, MD Referring Physician: Madelon Donald HERO, DO  Reason for Consultation: Establishing goals of care  HPI/Patient Profile: 89 y.o. female  with past medical history of HTN, seizure like activity, peripheral venous insufficiency, osteoporosis, glaucoma, and dementia admitted on 04/30/2024 with altered mental status.   AMS likely secondary to UTI.   Past Medical History:  Diagnosis Date   Arthritis    arthritis in hips    Candida infection of flexural skin 11/09/2021   Fracture of ankle, left, closed 10/1998   Gastritis 07/2006   by endoscopy   Healthcare maintenance 09/10/2021   HEMORRHOIDS, NOS 09/29/2006   Qualifier: Diagnosis of  By: Levonne MD, Wayne     Hypertension    Subungual hematoma of right foot 02/18/2015   Right great toe     Subjective:   I have reviewed medical records including EPIC notes, labs and imaging, received update from nursing, assessed the patient and then spoke by phone with the patient's son/HCPOA Wynonia Medero to discuss diagnosis prognosis, GOC, EOL wishes, disposition and options.  I introduced Palliative Medicine as specialized medical care for people living with serious illness. It focuses on providing relief from symptoms and stress of a serious illness. The goal is to improve quality of life for both the patient and the family.  Today's Discussion: Patient sleeping in bed in NAD. She does not easily awaken. No family at bedside.   Called patient's son/HCPOA Dyamon Sosinski. We discussed the patient's current hospitalization and quick decline over the last few weeks. We discussed her UTI, altered mental status, delirium, and dementia. We discussed her waxing and waning mental status and her poor  oral intake. During the family meeting with attending providers this week the family decided they want time for antibiotics to work but do not want to start artifical nutrition or hydration. Family shared the patient has always been pretty healthy and this has been a shock for them.  Patient is widowed. Her husband of 60 years passed away in Jun 06, 2023. The patient had a hard time with this and her daughter moved in to help her. The patient was able to complete most ADLs independently. She required assistance with some IADLs. Her granddaughter shared she took great pride in her home and garden and would spent time tending to them. The patient had a good appetite. In the last several weeks the patient became more agitated and more confused--- she wandered into traffic.  We discussed advanced directives. Confirmed patient's DNR/DNI status with son Elspeth.  We reviewed the patient's living will. We discussed the artifical feeding and hydration and continued IV antibiotics. We discussed the difference between an aggressive medical intervention path and a comfort focussed path for this patient. Patient's son would like additional time for outcomes-- a couple days. If the patient has not had meaningful improvement the family will likely transition to comfort focused care.   Discussed hospice philosophy and possible locations for end of life care. Family interested in getting additional information from [facility] about hospice services- TOC and liaison notified.  Emotional support and therapeutic listening provided. Discussed the importance of continued conversation with  family and the medical providers regarding overall plan of care and treatment options, ensuring decisions are within the context of the patient's values and GOCs.  Questions and concerns were addressed. The family was encouraged to call with questions or concerns. PMT will continue to support holistically.    1:45 pm. Notified by RN that family  at bedside and patient was more awake. When I got to the room the patient was sleeping again and did not easily awaken. Patient's oldest son and his family at bedside. Discussed patient's dementia, altered mental status, and nutritional status. Family shared the patient has always been pretty healthy and this has been a shock for them. Reviewed discussion with Elspeth this morning. Granddaughter shared patient would not mind a spiritual care visit- consult placed.     Review of Systems  Unable to perform ROS   Objective:   Primary Diagnoses: Present on Admission:  UTI (urinary tract infection)  AMS (altered mental status)  Subclinical hyperthyroidism  Behavioral change  Dementia [facility])   Physical Exam Vitals reviewed.  Constitutional:      General: She is sleeping. She is not in acute distress.    Appearance: She is ill-appearing.     Comments: One mitt  HENT:     Head: Normocephalic and atraumatic.     Mouth/Throat:     Mouth: Mucous membranes are dry.  Cardiovascular:     Rate and Rhythm: Normal rate.  Pulmonary:     Effort: Pulmonary effort is normal.  Skin:    General: Skin is warm and dry.  Neurological:     Mental Status: She is lethargic.     Vital Signs:  BP (!) 148/69 (BP Location: Right Arm)   Pulse 87   Temp 97.6 F (36.4 C) (Oral)   Resp 19   Ht 5' 3 (1.6 m)   Wt 54 kg   SpO2 93%   BMI 21.08 kg/m   Palliative Assessment/Data: 20%    Advanced Care Planning:   Existing Vynca/ACP Documentation: HCPOA/Living will  Primary Decision Maker: HCPOA. Patient's son Rashea Hoskie  Code Status/Advance Care Planning: DNR   Assessment & Plan:   SUMMARY OF RECOMMENDATIONS   Continue DNR/DNI Time for outcomes/improvement If no significant improvement will likely transition to comfort  TOC order placed for [facility] so family can get more information Spiritual care consult Continued PMT support    Discussed with: attending providers, TOC,  bedside RN  Time Total: 90 minutes    Thank you for allowing us  to participate in the care of PEARLINE YERBY PMT will continue to support holistically.   Signed by: Stephane Palin, NP Palliative Medicine Team  Team Phone # [phone] (Nights/Weekends)  05/05/2024, 2:13 PM

## 2024-05-05 NOTE — TOC Progression Note (Signed)
 Transition of Care [facility]) - Progression Note    Patient Details  Name: Amanda Shaw MRN: [id number] Date of Birth: 1930/06/13  Transition of Care [facility]) CM/SW Contact  Amyrah Pinkhasov [place], [place] Phone Number: 05/05/2024, 10:13 AM  Clinical Narrative:   Per Palliative Care APP, pt's family requesting to speak with [facility] rep for inpatient hospice placement. Referral made to [facility] with [facility].  Julien Das, MSW, LCSW [phone] (coverage)                        Expected Discharge Plan and Services                                               Social Drivers of Health (SDOH) Interventions SDOH Screenings   Food Insecurity: No Food Insecurity (04/30/2024)  Housing: Low Risk  (05/04/2024)  Transportation Needs: No Transportation Needs (04/30/2024)  Utilities: Not At Risk (04/30/2024)  Alcohol Screen: Low Risk  (01/03/2023)  Depression (PHQ2-9): Low Risk  (01/19/2024)  Financial Resource Strain: Low Risk  (01/03/2023)  Physical Activity: Insufficiently Active (01/03/2023)  Social Connections: Socially Isolated (04/30/2024)  Stress: No Stress Concern Present (01/03/2023)  Tobacco Use: Low Risk  (04/30/2024)    Readmission Risk Interventions     No data to display

## 2024-05-05 NOTE — Progress Notes (Signed)
 [name] [id number]  [facility] Liaison Note   Received request from Stephane Palin NP regarding family wanting to know more information about inpatient hospice at [facility]. Josie, Transitions of Care Manager aware. Spoke with Elspeth  to initiate education related to hospice philosophy, services, and IPU requirements. Elspeth verbalized understanding of information given. States he is waiting to make a decision at this time and would like to allow patient a few more days to see how she recovers. Contact information left with Elspeth.   Thank you for the opportunity to participate in this patient's care, please don't hesitate to call for any hospice related questions or concerns.    Nat Babe, BSN, [facility] Liaison [phone]

## 2024-05-06 DIAGNOSIS — N390 Urinary tract infection, site not specified: Secondary | ICD-10-CM | POA: Diagnosis not present

## 2024-05-06 DIAGNOSIS — R410 Disorientation, unspecified: Secondary | ICD-10-CM | POA: Diagnosis not present

## 2024-05-06 DIAGNOSIS — R4689 Other symptoms and signs involving appearance and behavior: Secondary | ICD-10-CM | POA: Diagnosis not present

## 2024-05-06 DIAGNOSIS — E440 Moderate protein-calorie malnutrition: Secondary | ICD-10-CM | POA: Diagnosis not present

## 2024-05-06 DIAGNOSIS — Z515 Encounter for palliative care: Secondary | ICD-10-CM | POA: Diagnosis not present

## 2024-05-06 DIAGNOSIS — N3000 Acute cystitis without hematuria: Secondary | ICD-10-CM | POA: Diagnosis not present

## 2024-05-06 DIAGNOSIS — Z7189 Other specified counseling: Secondary | ICD-10-CM | POA: Diagnosis not present

## 2024-05-06 MED ORDER — ACETAMINOPHEN 10 MG/ML IV SOLN
1000.0000 mg | Freq: Three times a day (TID) | INTRAVENOUS | Status: DC | PRN
  Administered 2024-05-07: 1000 mg via INTRAVENOUS
  Filled 2024-05-06: qty 100

## 2024-05-06 NOTE — Plan of Care (Signed)

## 2024-05-06 NOTE — Progress Notes (Signed)
     Daily Progress Note Intern Pager: [phone]  Patient name: DYLANA SHAW Medical record number: [id number] Date of birth: 08-Apr-1930 Age: 89 y.o. Gender: female  Primary Care Provider: Donah Laymon PARAS, MD Consultants: palliative care Code Status: DNR/DNI  Pt Overview and Major Events to Date:  9/29 - Admitted  Assessment and Plan:  89yo female with PMHx dementia, HTN, HLD, glaucoma, osteoporosis admitted for AMS suspected in setting of AMS, now undergoing goals of care discussions and potential transition to hospice.  Assessment & Plan UTI (urinary tract infection) AMS (altered mental status) S/p Ceftriaxone (9/29-10/1) and Cefazolin  (10/2-10/3) for UTI, urine culture grew Klebsiella. No agitation overnight.  - Continue goals of care discussions, palliative care following - Bladder scans as needed if suspect urinary retention  - Delirium precautions - Fall precautions Chronic health problem HTN: stopped home hydrochlorothiazide  12.5mg  HLD: stopped home simvastatin  20 mg daily Glaucoma: continue home latanoprost 1 drop both eyes daily Dementia: stopped home donepezil  5 mg daily Moderate Malnutrition: continue Ensure BID    FEN/GI: dysphagia 3 diet PPx: none, slowly transitioning towards comfort measures Dispo:Pending family discussions   Subjective:  NAEON, resting comfortably  Objective: Temp:  [97.4 F (36.3 C)-98.3 F (36.8 C)] 98.3 F (36.8 C) (10/04 2012) Pulse Rate:  [81-92] 89 (10/04 2012) Resp:  [19] 19 (10/04 2012) BP: (148-158)/(69-84) 158/84 (10/04 2012) SpO2:  [93 %-96 %] 96 % (10/04 2012) Physical Exam: General: Sleeping but arouses to voice, NAD Cardiovascular: RRR, no murmurs Respiratory: CTAB, breathing comfortably on RA Extremities: No swelling BLE  Laboratory: Most recent CBC Lab Results  Component Value Date   WBC 8.9 05/03/2024   HGB 14.3 05/03/2024   HCT 41.5 05/03/2024   MCV 92.0 05/03/2024   PLT 200 05/03/2024    Most recent BMP    Latest Ref Rng & Units 05/03/2024    5:40 AM  BMP  Glucose 70 - 99 mg/dL 867   BUN 8 - 23 mg/dL 19   Creatinine 9.55 - 1.00 mg/dL 9.11   Sodium 864 - 854 mmol/L 138   Potassium 3.5 - 5.1 mmol/L 3.4   Chloride 98 - 111 mmol/L 104   CO2 22 - 32 mmol/L 25   Calcium 8.9 - 10.3 mg/dL 8.9     Kahlani Graber, DO 05/06/2024, 6:05 AM  PGY-2, [facility] FPTS Intern pager: [phone], text pages welcome Secure chat group [facility] Teaching Service

## 2024-05-06 NOTE — Assessment & Plan Note (Addendum)
 HTN: stopped home hydrochlorothiazide  12.5mg  HLD: stopped home simvastatin  20 mg daily Glaucoma: continue home latanoprost 1 drop both eyes daily Dementia: stopped home donepezil  5 mg daily Moderate Malnutrition: continue Ensure BID

## 2024-05-06 NOTE — Assessment & Plan Note (Addendum)
 S/p Ceftriaxone (9/29-10/1) and Cefazolin  (10/2-10/3) for UTI, urine culture grew Klebsiella. No agitation overnight.  - Continue goals of care discussions, palliative care following - Bladder scans as needed if suspect urinary retention  - Delirium precautions - Fall precautions

## 2024-05-06 NOTE — Progress Notes (Signed)
 Palliative Medicine Inpatient Follow Up Note   HPI:  89 y.o. female  with past medical history of HTN, seizure like activity, peripheral venous insufficiency, osteoporosis, glaucoma, and dementia admitted on 04/30/2024 with altered mental status.    AMS likely secondary to UTI.   Today's Discussion 05/06/2024  *Please note that this is a verbal dictation therefore any spelling or grammatical errors are due to the Dragon Medical One system interpretation.  Chart reviewed inclusive of vital signs, progress notes, laboratory results, and diagnostic images.   Received bedside report from RN indicating the patient received one dose of Haldol overnight due to agitation and restlessness. The patient remains significantly confused and is unable to follow commands.  During my initial visit, the patient was in bed, appearing chronically ill but not in acute distress. Safety mittens were in place. She was unable to engage in meaningful conversation, and no family members were present at the time.  I returned later in the day and found family members at the bedside, including sons Prentice and Elspeth, and daughters-in-law Donzell and Dorothyann. We discussed the patient's current medical condition and reviewed events from the past 24 hours, including the administration of Haldol due to agitation last night. The patient appeared slightly more alert and was able to minimally interact with her family. I observed her respond to simple commands, such as raising her arms, and she was able to say "thank you" in response to kind remarks. Family expressed concern that the patient may be experiencing body or joint pain. Due to their worry about potential aspiration, I will place an order for IV Tylenol  to be administered as needed (PRN) for comfort and symptom management.  I explained that her altered mental status may be related to a urinary tract infection and delirium, but also noted that given her advanced age and  persistent confusion, this may reflect a progressive decline, possibly due to underlying dementia. The family acknowledged this and agreed to continue supportive care for now, hoping for clinical improvement.  We also discussed the possibility of transitioning to comfort-focused care should her condition fail to improve. This approach would prioritize quality of life, comfort, peace, and dignity. The family verbalized understanding and appreciation for the discussion.  Created space and opportunity for patient to explore thoughts feelings and fears regarding current medical situation.  Patient and her family face treatment option decisions, advanced directive decisions and anticipatory care needs.   Questions and concerns addressed   Palliative Support Provided.   Objective Assessment: Vital Signs Vitals:   05/05/24 1651 05/05/24 2012  BP: (!) 158/69 (!) 158/84  Pulse: 92 89  Resp:  19  Temp: 97.6 F (36.4 C) 98.3 F (36.8 C)  SpO2: 95% 96%   No intake or output data in the 24 hours ending 05/06/24 0916 Last Weight  Most recent update: 05/01/2024 12:37 PM    Weight  54 kg (119 lb)            Physical Exam Vitals reviewed.  Constitutional:      General: She is sleeping. She is not in acute distress.    Appearance: She is ill-appearing.     Comments: One mitt  HENT:     Head: Normocephalic and atraumatic.     Mouth/Throat:     Mouth: Mucous membranes are dry.  Cardiovascular:     Rate and Rhythm: Normal rate.  Pulmonary:     Effort: Pulmonary effort is normal.  Skin:    General: Skin is  warm and dry.  Neurological:     Mental Status: She is lethargic, confused, nonsensical  SUMMARY OF RECOMMENDATIONS     Continue DNR/DNI Allow time for outcomes/improvement If no significant improvement will likely transition to comfort  TOC order placed for [facility] so family can get more information Added Tylenol  1000mg  IV Q8 PRN for pain Spiritual care consult Continued  PMT support    Time Spent: 50 minutes  Detailed review of medical records (labs, imaging, vital signs), medically appropriate exam, discussed with treatment team, counseling and education to patient, family, & staff, documenting clinical information, coordination of care.   ______________________________________________________________________________________ Kathlyne Bolder NP-C  Palliative Medicine Team Team Cell Phone: [phone] Please utilize secure chat with additional questions, if there is no response within 30 minutes please call the above phone number  Palliative Medicine Team providers are available by phone from 7am to 7pm daily and can be reached through the team cell phone.  Should this patient require assistance outside of these hours, please call the patient's attending physician.

## 2024-05-07 DIAGNOSIS — R4182 Altered mental status, unspecified: Secondary | ICD-10-CM | POA: Diagnosis not present

## 2024-05-07 DIAGNOSIS — Z7189 Other specified counseling: Secondary | ICD-10-CM | POA: Diagnosis not present

## 2024-05-07 DIAGNOSIS — R4689 Other symptoms and signs involving appearance and behavior: Secondary | ICD-10-CM | POA: Diagnosis not present

## 2024-05-07 DIAGNOSIS — E440 Moderate protein-calorie malnutrition: Secondary | ICD-10-CM | POA: Diagnosis not present

## 2024-05-07 DIAGNOSIS — Z515 Encounter for palliative care: Secondary | ICD-10-CM | POA: Diagnosis not present

## 2024-05-07 DIAGNOSIS — N390 Urinary tract infection, site not specified: Secondary | ICD-10-CM | POA: Diagnosis not present

## 2024-05-07 DIAGNOSIS — R410 Disorientation, unspecified: Secondary | ICD-10-CM | POA: Diagnosis not present

## 2024-05-07 MED ORDER — HALOPERIDOL LACTATE 2 MG/ML PO CONC: 2.0000 mg | Freq: Four times a day (QID) | ORAL | Status: DC | PRN

## 2024-05-07 MED ORDER — HYDROMORPHONE HCL 1 MG/ML IJ SOLN
0.5000 mg | INTRAMUSCULAR | Status: DC | PRN
  Administered 2024-05-07 – 2024-05-08 (×3): 1 mg via INTRAVENOUS
  Administered 2024-05-08: 0.5 mg via INTRAVENOUS
  Administered 2024-05-09 – 2024-05-10 (×3): 1 mg via INTRAVENOUS
  Administered 2024-05-11: 0.5 mg via INTRAVENOUS
  Filled 2024-05-07 (×8): qty 1

## 2024-05-07 MED ORDER — ONDANSETRON 4 MG PO TBDP: 4.0000 mg | ORAL_TABLET | Freq: Four times a day (QID) | ORAL | Status: DC | PRN

## 2024-05-07 MED ORDER — LORAZEPAM 2 MG/ML IJ SOLN: 1.0000 mg | INTRAMUSCULAR | Status: DC | PRN

## 2024-05-07 MED ORDER — LORAZEPAM 1 MG PO TABS: 1.0000 mg | ORAL_TABLET | ORAL | Status: DC | PRN

## 2024-05-07 MED ORDER — OXYCODONE HCL 20 MG/ML PO CONC: 5.0000 mg | ORAL | Status: DC | PRN

## 2024-05-07 MED ORDER — GLYCOPYRROLATE 0.2 MG/ML IJ SOLN: 0.2000 mg | INTRAMUSCULAR | Status: DC | PRN

## 2024-05-07 MED ORDER — BIOTENE DRY MOUTH MT LIQD
15.0000 mL | Freq: Two times a day (BID) | OROMUCOSAL | Status: DC
  Administered 2024-05-07 – 2024-05-10 (×8): 15 mL via TOPICAL

## 2024-05-07 MED ORDER — HALOPERIDOL LACTATE 5 MG/ML IJ SOLN: 2.0000 mg | Freq: Four times a day (QID) | INTRAMUSCULAR | Status: DC | PRN

## 2024-05-07 MED ORDER — LORAZEPAM 2 MG/ML PO CONC: 1.0000 mg | ORAL | Status: DC | PRN

## 2024-05-07 MED ORDER — ACETAMINOPHEN 325 MG PO TABS: 650.0000 mg | ORAL_TABLET | Freq: Four times a day (QID) | ORAL | Status: DC | PRN

## 2024-05-07 MED ORDER — ONDANSETRON HCL 4 MG/2ML IJ SOLN: 4.0000 mg | Freq: Four times a day (QID) | INTRAMUSCULAR | Status: DC | PRN

## 2024-05-07 MED ORDER — GLYCOPYRROLATE 1 MG PO TABS: 1.0000 mg | ORAL_TABLET | ORAL | Status: DC | PRN

## 2024-05-07 MED ORDER — DIPHENHYDRAMINE HCL 50 MG/ML IJ SOLN: 25.0000 mg | INTRAMUSCULAR | Status: DC | PRN

## 2024-05-07 MED ORDER — HALOPERIDOL 1 MG PO TABS: 2.0000 mg | ORAL_TABLET | Freq: Four times a day (QID) | ORAL | Status: DC | PRN

## 2024-05-07 MED ORDER — POLYVINYL ALCOHOL 1.4 % OP SOLN: 1.0000 [drp] | Freq: Four times a day (QID) | OPHTHALMIC | Status: DC | PRN

## 2024-05-07 MED ORDER — ACETAMINOPHEN 650 MG RE SUPP: 650.0000 mg | Freq: Four times a day (QID) | RECTAL | Status: DC | PRN

## 2024-05-07 NOTE — Assessment & Plan Note (Addendum)
 Pt now on comfort cares per Palliative team. See their note for detailed plan.

## 2024-05-07 NOTE — Plan of Care (Signed)
 Patient calm and cooperative alert to self only. Patient able to answer yes/ no questions. Full linen change and bath given. PRN pain medications given before change due to patient hollering out while turning  Problem: Education: Goal: Knowledge of General Education information will improve Description: Including pain rating scale, medication(s)/side effects and non-pharmacologic comfort measures Outcome: Progressing   Problem: Health Behavior/Discharge Planning: Goal: Ability to manage health-related needs will improve Outcome: Progressing   Problem: Clinical Measurements: Goal: Ability to maintain clinical measurements within normal limits will improve Outcome: Progressing   Problem: Activity: Goal: Risk for activity intolerance will decrease Outcome: Progressing   Problem: Nutrition: Goal: Adequate nutrition will be maintained Outcome: Progressing   Problem: Coping: Goal: Level of anxiety will decrease Outcome: Progressing   Problem: Pain Managment: Goal: General experience of comfort will improve and/or be controlled Outcome: Progressing   Problem: Safety: Goal: Ability to remain free from injury will improve Outcome: Progressing   Problem: Skin Integrity: Goal: Risk for impaired skin integrity will decrease Outcome: Progressing

## 2024-05-07 NOTE — Progress Notes (Signed)
 This chaplain responded to PMT NP-Dawn's consult for EOL spiritual care. The chaplain understands the Pt. granddaughter requested the visit for the Pt. The chaplain reviewed the chart notes and received an update from the Pt. RN-Tyreka before the visit.  The Pt. is resting without visible distress during the visit. The Pt. affirmed family names and her faith as [employer] with the chaplain. The Pt. accepted the invitation to listen and sing hymns with the chaplain. When the chaplain asked if the Pt. wanted to pray, the Pt.  acknowledged the Lord's Prayer with prayerful hands.  This chaplain is available for F/U spiritual care as needed.  Chaplain Leeroy Hummer [phone]

## 2024-05-07 NOTE — Plan of Care (Signed)
   Problem: Skin Integrity: Goal: Risk for impaired skin integrity will decrease Outcome: Progressing

## 2024-05-07 NOTE — Progress Notes (Signed)
 Palliative Medicine Inpatient Follow Up Note   HPI:  89 y.o. female  with past medical history of HTN, seizure like activity, peripheral venous insufficiency, osteoporosis, glaucoma, and dementia admitted on 04/30/2024 with altered mental status.    AMS likely secondary to UTI.   Today's Discussion 05/07/2024  *Please note that this is a verbal dictation therefore any spelling or grammatical errors are due to the Dragon Medical One system interpretation.  Chart reviewed inclusive of vital signs, progress notes, laboratory results, and diagnostic images.   I visited the patient today and observed that she appears very frail and chronically ill. She was minimally responsive to both verbal and tactile stimuli. Her breathing was rapid and shallow, with intermittent periods of apnea, though she did not appear to be in acute distress. I received a report from the bedside nurse, who noted a decline in responsiveness and continued poor appetite. No acute events were reported overnight.  Following the assessment, I facilitated a family meeting to revisit the goals of care. In attendance were her sons, Prentice and Elspeth, along with her daughter-in-law, Marquetta. I expressed my clinical concerns, noting that the patient has not demonstrated signs of improvement and that the meaningful outcomes the family may be hoping for are unlikely. I invited their thoughts, and they acknowledged that they have also observed "no improvement." I shared that the patient may be actively transitioning, and that intermittent periods of lucidity can occur during this time. I encouraged them to spend as much quality time with her as possible. The family expressed understanding and agreement with this approach.  We discussed the benefits of a comfort-focused approach, which prioritizes quality of life, comfort, and dignity. I provided information about hospice philosophy and what to expect moving forward. The family chose to  pursue comfort-focused care, in alignment with the patient's wishes as documented in her living will.  Regarding care setting, the family expressed interest in [facility] inpatient hospice, if the patient qualifies. We also discussed the possibility of home hospice. I will place a follow-up consult for the [facility] team to assist in coordinating hospice services in the outpatient setting.  Created space and opportunity for patient to explore thoughts feelings and fears regarding current medical situation.  Patient and her family face treatment option decisions, advanced directive decisions and anticipatory care needs.   Questions and concerns addressed.  Palliative Support Provided.   Objective Assessment: Vital Signs Vitals:   05/06/24 1949 05/07/24 0755  BP: (!) 133/59 (!) 141/79  Pulse: (!) 101 91  Resp: 19 16  Temp: 98.5 F (36.9 C) 97.7 F (36.5 C)  SpO2: 95% 96%    Intake/Output Summary (Last 24 hours) at 05/07/2024 1138 Last data filed at 05/07/2024 0415 Gross per 24 hour  Intake --  Output 350 ml  Net -350 ml   Last Weight  Most recent update: 05/01/2024 12:37 PM    Weight  54 kg (119 lb)            Physical Exam Vitals reviewed.  Constitutional:      General: She is sleeping. She is not in acute distress.    Appearance: She is ill-appearing.     Comments: One mitt  HENT:     Head: Normocephalic and atraumatic.     Mouth/Throat:     Mouth: Mucous membranes are dry.  Cardiovascular:     Rate and Rhythm: Normal rate.  Pulmonary:     Effort: Pulmonary effort is normal.  Skin:  General: Skin is warm and dry.  Neurological:     Mental Status: She is lethargic, confused, nonsensical  SUMMARY OF RECOMMENDATIONS      Code Status: DNR-Comfort Continue to provide psycho-social and emotional support to patient and family Palliative medicine team will continue to follow.   Symptom Management: Hydromorphone  PRN for severe pain/air hunger/comfort Oxycodone  PRN  for moderate pain Acetaminophen  PRN for mild pain Lorazepam PRN for agitation/restlessness/anxiety Robinul  PRN for excessive secretions Zofran  PRN for nausea Liquifilm tears PRN for dry eyes Haldol PRN for agitation/anxiety May have comfort feeding Comfort cart for family Unrestricted visitations in the setting of EOL (per policy) Oxygen PRN 2L or less for comfort. No escalation.    Time Spent: 65 minutes  Detailed review of medical records (labs, imaging, vital signs), medically appropriate exam, discussed with treatment team, counseling and education to patient, family, & staff, documenting clinical information, coordination of care.   ______________________________________________________________________________________ Kathlyne Bolder NP-C [place] Palliative Medicine Team Team Cell Phone: [phone] Please utilize secure chat with additional questions, if there is no response within 30 minutes please call the above phone number  Palliative Medicine Team providers are available by phone from 7am to 7pm daily and can be reached through the team cell phone.  Should this patient require assistance outside of these hours, please call the patient's attending physician.

## 2024-05-07 NOTE — Progress Notes (Signed)
 [id number] [facility] Liaison Note  Received a request from [facility]  for family interest in [facility].  Chart reviewed and spoke with family to acknowledge referral.  Hospice eligibility is confirmed.  Unfortunately, [facility] is not able to offer a room today.  Family and [facility] aware Hospital Liaison will follow up tomorrow if a room becomes available.  Please do not hesitate to call with questions.  Thank you Inocente Jacobs BSN, [employer] [phone]

## 2024-05-07 NOTE — Progress Notes (Signed)
 SLP Cancellation and D/C Note  Patient Details Name: Amanda Shaw MRN: [id number] DOB: [birth year]   Cancelled treatment:    Pt and family have elected to transition to comfort care. Our service will respectfully sign off.  Greig Altergott L. Vona, MA CCC/SLP Clinical Specialist - Acute Care SLP Acute Rehabilitation Services Office number [phone]        [name] 05/07/2024, 1:43 PM

## 2024-05-07 NOTE — Progress Notes (Signed)
     Daily Progress Note Intern Pager: [phone]  Patient name: Amanda Shaw Medical record number: [id number] Date of birth: 14-Jan-1930 Age: 89 y.o. Gender: female  Primary Care Provider: Donah Laymon PARAS, MD Consultants: Palliative Care Code Status: DNR/DNI  Pt Overview and Major Events to Date:  9/29 - Admitted  Assessment and Plan:  89yo female with PMHx dementia, HTN, HLD, glaucoma, osteoporosis admitted for AMS suspected in setting of UTI. UTI is treated, AMS has not significantly improved.   Family has discussed with Palliative that they are ready to move toward comfort focused approach. Palliative care team is placing orders. Assessment & Plan AMS (altered mental status) UTI (urinary tract infection) (Resolved: 05/07/2024) Subclinical hyperthyroidism Chronic health problem Edema of left lower extremity Malnutrition of moderate degree Pleural effusion, bilateral Pt now on comfort cares per Palliative team. See their note for detailed plan.  Dispo: Will likely transfer patient to hospice.  Subjective:   NEON, resting comfortably. Responds to questions with short grunts. Family has decided to move to comfort cares.   Objective: Temp:  [97.4 F (36.3 C)-98.5 F (36.9 C)] 97.7 F (36.5 C) (10/06 0755) Pulse Rate:  [85-101] 91 (10/06 0755) Resp:  [16-19] 16 (10/06 0755) BP: (128-147)/(59-93) 141/79 (10/06 0755) SpO2:  [95 %-98 %] 96 % (10/06 0755)  Physical Exam Vitals and nursing note reviewed. Exam conducted with a chaperone present.  Constitutional:      Appearance: She is ill-appearing.  Cardiovascular:     Rate and Rhythm: Normal rate and regular rhythm.     Heart sounds: No murmur heard. Pulmonary:     Effort: Pulmonary effort is normal. No respiratory distress.  Abdominal:     Palpations: Abdomen is soft.     Tenderness: There is no abdominal tenderness.  Musculoskeletal:     Right lower leg: No edema.     Left lower leg: No edema.   Skin:    General: Skin is warm and dry.  Neurological:     Mental Status: She is alert.      Laboratory: Most recent CBC Lab Results  Component Value Date   WBC 8.9 05/03/2024   HGB 14.3 05/03/2024   HCT 41.5 05/03/2024   MCV 92.0 05/03/2024   PLT 200 05/03/2024   Most recent BMP    Latest Ref Rng & Units 05/03/2024    5:40 AM  BMP  Glucose 70 - 99 mg/dL 867   BUN 8 - 23 mg/dL 19   Creatinine 9.55 - 1.00 mg/dL 9.11   Sodium 864 - 854 mmol/L 138   Potassium 3.5 - 5.1 mmol/L 3.4   Chloride 98 - 111 mmol/L 104   CO2 22 - 32 mmol/L 25   Calcium 8.9 - 10.3 mg/dL 8.9     Other pertinent labs: n/a  Imaging/Diagnostic Tests: Radiologist Impression: No results found.  My interpretation: n/a Lera Nancyann KATHEE ROSALEA 05/07/2024, 8:37 AM  PGY-1, [facility] Health Family Medicine FPTS Intern pager: [phone], text pages welcome Secure chat group [facility] Teaching Service

## 2024-05-08 DIAGNOSIS — E440 Moderate protein-calorie malnutrition: Secondary | ICD-10-CM | POA: Diagnosis not present

## 2024-05-08 DIAGNOSIS — Z515 Encounter for palliative care: Secondary | ICD-10-CM | POA: Diagnosis not present

## 2024-05-08 DIAGNOSIS — Z7189 Other specified counseling: Secondary | ICD-10-CM | POA: Diagnosis not present

## 2024-05-08 DIAGNOSIS — R4689 Other symptoms and signs involving appearance and behavior: Secondary | ICD-10-CM | POA: Diagnosis not present

## 2024-05-08 DIAGNOSIS — R4182 Altered mental status, unspecified: Secondary | ICD-10-CM | POA: Diagnosis not present

## 2024-05-08 NOTE — Plan of Care (Signed)

## 2024-05-08 NOTE — Assessment & Plan Note (Signed)
 Pt now on comfort cares per Palliative team. See their note for detailed plan.

## 2024-05-08 NOTE — Progress Notes (Signed)
     Daily Progress Note Intern Pager: [phone]  Patient name: Amanda Shaw Medical record number: [id number] Date of birth: Jul 05, 1930 Age: 89 y.o. Gender: female  Primary Care Provider: Donah Laymon PARAS, MD Consultants: Palliative Care Code Status: DNR/DNI  Pt Overview and Major Events to Date:  9/29 - Admitted 10/6 - Pt transitioned to comfort cares  Assessment and Plan:  89yo female with PMHx dementia, HTN, HLD, glaucoma, osteoporosis admitted for AMS suspected in setting of UTI. UTI is treated, AMS has not significantly improved. Pt is now on comfort cares. Plan is for her to transfer to [facility] when a bed is available, pt has been accepted.   Assessment & Plan AMS (altered mental status) Subclinical hyperthyroidism Chronic health problem Edema of left lower extremity Malnutrition of moderate degree Pleural effusion, bilateral Pt now on comfort cares per Palliative team. See their note for detailed plan.   FEN/GI: regular PPx: none Dispo:Hospice, home vs [facility] pending  Subjective:   Vital Signs Stable. Pt follows commands to squeeze and release fingers. Slight tracking of eyes.  Objective: Temp:  [97.7 F (36.5 C)-97.9 F (36.6 C)] 97.9 F (36.6 C) (10/07 0745) Pulse Rate:  [91-112] 112 (10/07 0745) Resp:  [16-18] 18 (10/07 0745) BP: (123-141)/(76-80) 133/80 (10/07 0745) SpO2:  [94 %-97 %] 94 % (10/07 0745)  Physical Exam Constitutional:      General: She is not in acute distress. Cardiovascular:     Rate and Rhythm: Normal rate.     Heart sounds: No murmur heard. Pulmonary:     Effort: No respiratory distress.  Skin:    General: Skin is warm and dry.  Neurological:     Mental Status: She is alert.      Laboratory: Most recent CBC Lab Results  Component Value Date   WBC 8.9 05/03/2024   HGB 14.3 05/03/2024   HCT 41.5 05/03/2024   MCV 92.0 05/03/2024   PLT 200 05/03/2024   Most recent BMP    Latest Ref Rng & Units  05/03/2024    5:40 AM  BMP  Glucose 70 - 99 mg/dL 867   BUN 8 - 23 mg/dL 19   Creatinine 9.55 - 1.00 mg/dL 9.11   Sodium 864 - 854 mmol/L 138   Potassium 3.5 - 5.1 mmol/L 3.4   Chloride 98 - 111 mmol/L 104   CO2 22 - 32 mmol/L 25   Calcium 8.9 - 10.3 mg/dL 8.9     Imaging/Diagnostic Tests: Radiologist Impression: No results found. My interpretation: n/a Lera Nancyann KATHEE ROSALEA 05/08/2024, 7:53 AM  PGY-1, [facility] Health Family Medicine FPTS Intern pager: [phone], text pages welcome Secure chat group [facility] Teaching Service

## 2024-05-08 NOTE — TOC Progression Note (Signed)
 Transition of Care [facility]) - Progression Note    Patient Details  Name: Amanda Shaw MRN: [id number] Date of Birth: November 30, 1929  Transition of Care [facility]) CM/SW Contact  Rosaline JONELLE Joe, RN Phone Number: 05/08/2024, 12:26 PM  Clinical Narrative:    Cm spoke with CM with [facility] and patient has been accepted for Inpatient hospice at [facility] but is waiting on bed availability at this time.   No family are present in the room.  Palliative Care team will continues to follow the patient while patient is waiting on placement.                     Expected Discharge Plan and Services                                               Social Drivers of Health (SDOH) Interventions SDOH Screenings   Food Insecurity: No Food Insecurity (04/30/2024)  Housing: Low Risk  (05/04/2024)  Transportation Needs: No Transportation Needs (04/30/2024)  Utilities: Not At Risk (04/30/2024)  Alcohol Screen: Low Risk  (01/03/2023)  Depression (PHQ2-9): Low Risk  (01/19/2024)  Financial Resource Strain: Low Risk  (01/03/2023)  Physical Activity: Insufficiently Active (01/03/2023)  Social Connections: Socially Isolated (04/30/2024)  Stress: No Stress Concern Present (01/03/2023)  Tobacco Use: Low Risk  (04/30/2024)    Readmission Risk Interventions     No data to display

## 2024-05-08 NOTE — Care Management Important Message (Addendum)
 Important Message  Patient Details  Name: Amanda Shaw MRN: [id number] Date of Birth: July 26, 1930   Important Message Given:  Yes - Medicare IM Due to illness patient could not sign a copy was left in the patient room at bedside.     Priyah Schmuck 05/08/2024, 10:30 AM

## 2024-05-08 NOTE — Progress Notes (Signed)
   Palliative Medicine Inpatient Follow Up Note HPI:   89 y.o. female  with past medical history of HTN, seizure like activity, peripheral venous insufficiency, osteoporosis, glaucoma, and dementia admitted on 04/30/2024 with altered mental status.    AMS likely secondary to UTI.   Today's Discussion 05/08/2024  *Please note that this is a verbal dictation therefore any spelling or grammatical errors are due to the Dragon Medical One system interpretation.  Chart reviewed inclusive of vital signs, progress notes, laboratory results, and diagnostic images. Sahar has received x3 doses of dilaudid  IVP in the last 24 hours.   Per secure chat with patients RN, Tara there are no significant concerns this morning.   I met with Petula this morning. She has her eyes open though is not responsive. She is generally very frail in appearance. Her breathing pattern appears even and regular. She has warm extremities with palpable pulses.   I called and spoke with patients son, Elspeth. We reviewed again, the plan for [facility] once there is an accepting bed. He expressed gratitude for this update.  Have reached out to [facility] as well as [facility] liaison to gain further insights on whether or not a bed will be offered today.   Questions and concerns addressed/Palliative Support Provided.   Objective Assessment: Vital Signs Vitals:   05/07/24 2216 05/08/24 0745  BP: 123/76 133/80  Pulse: 98 (!) 112  Resp: 17 18  Temp: 97.8 F (36.6 C) 97.9 F (36.6 C)  SpO2: 97% 94%   No intake or output data in the 24 hours ending 05/08/24 0850  Last Weight  Most recent update: 05/01/2024 12:37 PM    Weight  54 kg (119 lb)            Physical Exam Gen:  Frail elderly Caucasian F chronically ill in appearance HEENT: Dry mucous membranes CV: Irregular rate and rhythm  PULM: On RA, breathing is even and nonlabored ABD: soft/nontender  EXT: No edema  Neuro: Somnolent though opens  eyes  SUMMARY OF RECOMMENDATIONS    Code Status: DNR-Comfort Continue to provide psycho-social and emotional support to patient and family Palliative medicine team will continue to follow.  [facility] liaison will update once a bed is available as patient  has been approved for [facility]  Symptom Management: Hydromorphone  PRN for severe pain/air hunger/comfort - at this time the dose is working well Oxycodone  PRN for moderate pain Acetaminophen  PRN for mild pain Lorazepam PRN for agitation/restlessness/anxiety Robinul  PRN for excessive secretions Zofran  PRN for nausea Liquifilm tears PRN for dry eyes Haldol PRN for agitation/anxiety May have comfort feeding Comfort cart for family Unrestricted visitations in the setting of EOL (per policy) Oxygen PRN 2L or less for comfort. No escalation.  ______________________________________________________________________________________ Rosaline Becton Manning Palliative Medicine Team Team Cell Phone: [phone] Please utilize secure chat with additional questions, if there is no response within 30 minutes please call the above phone number  MDM: High in the setting of IV opioid review/adjustment  Detailed review of medical records (labs, imaging, vital signs), medically appropriate exam, discussed with treatment team, counseling and education to patient, family, & staff, documenting clinical information, coordination of care.

## 2024-05-08 NOTE — Progress Notes (Signed)
 Nutrition Brief Note  Chart reviewed. Pt now transitioned to comfort care 10/6.  Regular diet ordered.  No further nutrition interventions planned at this time.  Please re-consult as needed.   Surena Welge, MS, RD, LDN Clinical Dietitian  Please see AMiON for contact information.

## 2024-05-08 NOTE — Plan of Care (Addendum)
 Comfort care measures continued. Patient tries to answer yes or no questions and can track staff while staff stands on right side of patient. Turn tolerance maintained. Lip moisturizer applied, PRN pain medication administered before partial linens change given.   Problem: Education: Goal: Knowledge of General Education information will improve Description: Including pain rating scale, medication(s)/side effects and non-pharmacologic comfort measures Outcome: Progressing   Problem: Health Behavior/Discharge Planning: Goal: Ability to manage health-related needs will improve Outcome: Progressing   Problem: Clinical Measurements: Goal: Ability to maintain clinical measurements within normal limits will improve Outcome: Progressing   Problem: Nutrition: Goal: Adequate nutrition will be maintained Outcome: Progressing   Problem: Coping: Goal: Level of anxiety will decrease Outcome: Progressing   Problem: Elimination: Goal: Will not experience complications related to bowel motility Outcome: Progressing   Problem: Pain Managment: Goal: General experience of comfort will improve and/or be controlled Outcome: Progressing   Problem: Safety: Goal: Ability to remain free from injury will improve Outcome: Progressing   Problem: Skin Integrity: Goal: Risk for impaired skin integrity will decrease Outcome: Progressing   Problem: Pain Management: Goal: Satisfaction with pain management regimen will improve Outcome: Progressing

## 2024-05-09 DIAGNOSIS — Z515 Encounter for palliative care: Secondary | ICD-10-CM | POA: Diagnosis not present

## 2024-05-09 DIAGNOSIS — Z7189 Other specified counseling: Secondary | ICD-10-CM | POA: Diagnosis not present

## 2024-05-09 DIAGNOSIS — R4689 Other symptoms and signs involving appearance and behavior: Secondary | ICD-10-CM | POA: Diagnosis not present

## 2024-05-09 DIAGNOSIS — R4182 Altered mental status, unspecified: Secondary | ICD-10-CM | POA: Diagnosis not present

## 2024-05-09 DIAGNOSIS — E440 Moderate protein-calorie malnutrition: Secondary | ICD-10-CM | POA: Diagnosis not present

## 2024-05-09 NOTE — Assessment & Plan Note (Signed)
 Pt now on comfort cares per Palliative team. See their note for detailed plan.

## 2024-05-09 NOTE — Plan of Care (Signed)
 Patient resting calmly respirations at 16. Midshift patient able to track nurse and raise her arm to answer yes to questions. Turn tolerance maintained, mouth swabbed and lip moisturizer applied.    Problem: Education: Goal: Knowledge of General Education information will improve Description: Including pain rating scale, medication(s)/side effects and non-pharmacologic comfort measures Outcome: Progressing   Problem: Health Behavior/Discharge Planning: Goal: Ability to manage health-related needs will improve Outcome: Progressing   Problem: Clinical Measurements: Goal: Ability to maintain clinical measurements within normal limits will improve Outcome: Progressing   Problem: Activity: Goal: Risk for activity intolerance will decrease Outcome: Progressing   Problem: Elimination: Goal: Will not experience complications related to urinary retention Outcome: Progressing   Problem: Pain Managment: Goal: General experience of comfort will improve and/or be controlled Outcome: Progressing   Problem: Safety: Goal: Ability to remain free from injury will improve Outcome: Progressing   Problem: Coping: Goal: Ability to identify and develop effective coping behavior will improve Outcome: Progressing   Problem: Pain Management: Goal: Satisfaction with pain management regimen will improve Outcome: Progressing

## 2024-05-09 NOTE — Progress Notes (Signed)
     Daily Progress Note Intern Pager: [phone]  Patient name: Amanda Shaw Medical record number: [id number] Date of birth: [birth year] Age: 89 y.o. Gender: female  Primary Care Provider: Donah Laymon PARAS, MD Consultants: Palliative Care Code Status: DNR/DNI  Pt Overview and Major Events to Date:  9/29 - Admitted 10/6 - Pt transitioned to comfort cares  Assessment and Plan:  89yo female with PMHx dementia, HTN, HLD, glaucoma, osteoporosis admitted for AMS suspected in setting of UTI. UTI is treated, AMS has not significantly improved. Pt is now on comfort cares. Plan is for her to transfer to [facility] when a bed is available, pt has been accepted. Family is involved and active. Son Steven (HCPOA) can be reached at [phone] for updates.  Still pending bed at [facility]. Pt accepted.  Assessment & Plan AMS (altered mental status) Subclinical hyperthyroidism Chronic health problem Edema of left lower extremity Malnutrition of moderate degree Pleural effusion, bilateral Pt now on comfort cares per Palliative team. See their note for detailed plan.  FEN/GI: regular PPx: none Dispo: Hospice at [facility] pending bed availability  Subjective:  Pt was resting in bed with her gown over her face so she could see the TV through the arm hole. She gestured for help to change the channel, but was non verbal, and her gestures were not completely clear. Was in no acute distress.   Objective: Temp:  [97.4 F (36.3 C)-97.9 F (36.6 C)] 97.4 F (36.3 C) (10/08 0536) Pulse Rate:  [110-116] 110 (10/08 0536) Resp:  [18] 18 (10/07 2133) BP: (104-133)/(73-82) 117/82 (10/08 0536) SpO2:  [89 %-94 %] 93 % (10/08 0536)  Physical Exam Vitals and nursing note reviewed. Exam conducted with a chaperone present.  Constitutional:      General: She is not in acute distress. Cardiovascular:     Rate and Rhythm: Normal rate.     Heart sounds: No murmur heard. Pulmonary:      Effort: Pulmonary effort is normal. No respiratory distress.  Abdominal:     Palpations: Abdomen is soft.     Tenderness: There is no abdominal tenderness.  Musculoskeletal:     Right lower leg: No edema.     Left lower leg: No edema.  Skin:    General: Skin is warm and dry.  Neurological:     Mental Status: She is alert.     Laboratory: Most recent CBC Lab Results  Component Value Date   WBC 8.9 05/03/2024   HGB 14.3 05/03/2024   HCT 41.5 05/03/2024   MCV 92.0 05/03/2024   PLT 200 05/03/2024   Most recent BMP    Latest Ref Rng & Units 05/03/2024    5:40 AM  BMP  Glucose 70 - 99 mg/dL 867   BUN 8 - 23 mg/dL 19   Creatinine 9.55 - 1.00 mg/dL 9.11   Sodium 864 - 854 mmol/L 138   Potassium 3.5 - 5.1 mmol/L 3.4   Chloride 98 - 111 mmol/L 104   CO2 22 - 32 mmol/L 25   Calcium 8.9 - 10.3 mg/dL 8.9     Imaging/Diagnostic Tests: Radiologist Impression: No results found. My interpretation: n/a Lera Nancyann KATHEE ROSALEA 05/09/2024, 7:26 AM  PGY-1, [facility] Health Family Medicine FPTS Intern pager: [phone], text pages welcome Secure chat group [facility] Teaching Service

## 2024-05-09 NOTE — Progress Notes (Signed)
   Palliative Medicine Inpatient Follow Up Note HPI:   89 y.o. female  with past medical history of HTN, seizure like activity, peripheral venous insufficiency, osteoporosis, glaucoma, and dementia admitted on 04/30/2024 with altered mental status.    AMS likely secondary to UTI.   Today's Discussion 05/09/2024  *Please note that this is a verbal dictation therefore any spelling or grammatical errors are due to the Dragon Medical One system interpretation.  Chart reviewed inclusive of vital signs, progress notes, laboratory results, and diagnostic images.  Has received x2 doses of dilaudid  in the last 24 hours.   I met with Amanda Shaw at bedside this morning. She is able to open her eyes. She is de-robbed therefore, I assisted her in placing he cloths back on. Amanda Shaw is not verbal. She has warm extremities. She is on RA. She has an elevated HR. Her pulses present and distal extremities are warm.  No family present at bedside at the time of assessment.  Per discussions with [facility] liaison, there is no bed available at [facility].   Questions and concerns addressed/Palliative Support Provided.   Objective Assessment: Vital Signs Vitals:   05/09/24 0536 05/09/24 0759  BP: 117/82 130/88  Pulse: (!) 110 (!) 110  Resp:  18  Temp: (!) 97.4 F (36.3 C) 98 F (36.7 C)  SpO2: 93% 95%    Intake/Output Summary (Last 24 hours) at 05/09/2024 1147 Last data filed at 05/09/2024 0400 Gross per 24 hour  Intake --  Output 300 ml  Net -300 ml    Last Weight  Most recent update: 05/01/2024 12:37 PM    Weight  54 kg (119 lb)            Physical Exam Gen:  Frail elderly Caucasian F chronically ill in appearance HEENT: Dry mucous membranes CV: Irregular rate and rhythm  PULM: On RA, breathing is even and nonlabored ABD: soft/nontender  EXT: No edema  Neuro: Somnolent though opens eyes  SUMMARY OF RECOMMENDATIONS    Code Status: DNR-Comfort Continue to provide psycho-social  and emotional support to patient and family Palliative medicine team will continue to follow.  [facility] liaison will update once a bed is available as patient has been approved for [facility] --> As of this morning there was not a bed available  Symptom Management: Hydromorphone  PRN for severe pain/air hunger/comfort - at this time the dose is working well Oxycodone  PRN for moderate pain Acetaminophen  PRN for mild pain Lorazepam PRN for agitation/restlessness/anxiety Robinul  PRN for excessive secretions Zofran  PRN for nausea Liquifilm tears PRN for dry eyes Haldol PRN for agitation/anxiety May have comfort feeding Comfort cart for family Unrestricted visitations in the setting of EOL (per policy) Oxygen PRN 2L or less for comfort. No escalation.  ______________________________________________________________________________________ Amanda Shaw [facility] Palliative Medicine Team Team Cell Phone: [phone] Please utilize secure chat with additional questions, if there is no response within 30 minutes please call the above phone number  MDM: High in the setting of IV opioid review/adjustment  Detailed review of medical records (labs, imaging, vital signs), medically appropriate exam, discussed with treatment team, counseling and education to patient, family, & staff, documenting clinical information, coordination of care.

## 2024-05-10 DIAGNOSIS — Z515 Encounter for palliative care: Secondary | ICD-10-CM | POA: Diagnosis not present

## 2024-05-10 DIAGNOSIS — F03918 Unspecified dementia, unspecified severity, with other behavioral disturbance: Secondary | ICD-10-CM

## 2024-05-10 NOTE — Progress Notes (Signed)
     Daily Progress Note Intern Pager: [phone]  Patient name: Amanda Shaw Medical record number: [id number] Date of birth: 08/20/1929 Age: 89 y.o. Gender: female  Primary Care Provider: Donah Laymon PARAS, MD Consultants: Palliative care Code Status: DNR/DNI  Pt Overview and Major Events to Date:  9/29 - Admitted 10/6 - Pt transitioned to comfort care  Assessment and Plan:  89yo female with PMHx dementia, HTN, HLD, glaucoma, osteoporosis admitted for AMS suspected in setting of UTI versus progression of dementia.  UTI treated with no significant improvement of AMS.  Patient and family decided on comfort care, and she awaits a bed at [facility]. Assessment & Plan AMS (altered mental status) Dementia (HCC) Chronic health problem Patient on comfort care awaiting hospice bed Palliative care following while inpatient Please see detailed symptom management recommendations and palliative care note  FEN/GI: regular PPx: none Dispo:to [facility] pending bed availability.   Subjective:  Patient resting comfortably in bed  Objective: No vitals today   Physical Exam: General: Thin woman resting comfortably in bed Respiratory: Mouth agape and mouth breathing, no increased work of breathing, lungs clear to auscultation bilaterally Cardiovascular: Regular rate and rhythm, no murmurs appreciated, very weak radial pulses  Laboratory: No labs   Imaging/Diagnostic Tests: None   Alena Morrison, Elio, MD 05/10/2024, 7:16 AM  PGY-1, [facility] Family Medicine FPTS Intern pager: [phone], text pages welcome Secure chat group [facility] Teaching Service

## 2024-05-10 NOTE — Assessment & Plan Note (Addendum)
 Patient on comfort care awaiting hospice bed Palliative care following while inpatient Please see detailed symptom management recommendations and palliative care note

## 2024-05-10 NOTE — Plan of Care (Signed)
  Problem: Safety: Goal: Ability to remain free from injury will improve Outcome: Progressing   Problem: Education: Goal: Knowledge of General Education information will improve Description: Including pain rating scale, medication(s)/side effects and non-pharmacologic comfort measures Outcome: Not Progressing   Problem: Health Behavior/Discharge Planning: Goal: Ability to manage health-related needs will improve Outcome: Not Progressing   Problem: Clinical Measurements: Goal: Ability to maintain clinical measurements within normal limits will improve Outcome: Not Progressing   Problem: Nutrition: Goal: Adequate nutrition will be maintained Outcome: Not Progressing

## 2024-05-10 NOTE — Progress Notes (Signed)
 [id number] [facility] Liaison Note   Received a request from [facility] for family interest in [facility].  Chart reviewed and spoke with family to acknowledge referral.  Hospice eligibility is confirmed.  Unfortunately, [facility] is not able to offer a room today.  [facility] Liaison will follow up tomorrow if a room becomes available.  Please do not hesitate to call with questions.   Thank you,  Greig Basket BSN, [facility] [phone]

## 2024-05-14 ENCOUNTER — Telehealth: Payer: Self-pay

## 2024-05-14 NOTE — Telephone Encounter (Signed)
 Patient's son, Marcey, left message on nurse line regarding patient. He reports that patient passed away on Jun 01, 2024. He wanted to make sure that Dr. Donah was aware of this.   He is also asking if death certificate can be signed off. They are trying to make arrangements and are needing this to be completed.   Will forward message to PCP.   Chiquita JAYSON English, RN

## 2024-05-15 NOTE — Telephone Encounter (Signed)
 DC signed Florence Kemps, left voicemail offering my condolences and letting him know it is signed Offered to speak with him on the phone if he has any questions  Laymon JINNY Legions, MD

## 2024-05-28 ENCOUNTER — Other Ambulatory Visit: Payer: Self-pay | Admitting: Family Medicine

## 2024-06-02 NOTE — Progress Notes (Incomplete)
 Patient expired at 05:55am. Two RN pronounced (with Rosilyn RAMAN., RN).

## 2024-06-02 NOTE — Plan of Care (Signed)
   Problem: Education: Goal: Knowledge of General Education information will improve Description: Including pain rating scale, medication(s)/side effects and non-pharmacologic comfort measures Outcome: Not Progressing   Problem: Health Behavior/Discharge Planning: Goal: Ability to manage health-related needs will improve Outcome: Not Progressing

## 2024-06-02 NOTE — Discharge Summary (Addendum)
   Family Medicine Teaching [facility] Death Summary  Patient name: Amanda Shaw Medical record number: [id number] Date of birth: [birth year] Age: 89 y.o. Gender: female Date of Admission: May 21, 2024  Date of Death: 2024/06/01 Admitting Physician: Alan Flies, MD  Primary Care Provider: Donah Laymon PARAS, MD Consultants: Palliative care  Indication for Hospitalization: AMS  Discharge Diagnoses/Problem List:  Principal Problem for Admission: AMS Other Problems addressed during stay:  Principal Problem:   AMS (altered mental status) Active Problems:   Subclinical hyperthyroidism   Edema of left lower extremity   Behavioral change   Dementia (HCC)   Malnutrition of moderate degree   Pleural effusion, bilateral   Brief Hospital Course:  Amanda Shaw is a 89 y.o. year old with a history of HTN, HLD, glaucoma, dementia, osteoporosis who presented with altered mental status and urinary frequency and was admitted to the [facility] for UTI.  AMS UTI Per family, patient acting different from baseline over past 3 to 4 weeks prior to admission, and especially agitated and aggressive over the few days prior to admission.  Increased urinary frequency, UA suggestive of UTI. Patient started on IV antibiotics with ceftriaxone 05-21-24- 10/3).  Throughout admission patient had waxing and waning responsiveness that did not improve with UTI treatment.  MRI brain negative workup otherwise unrevealing.  Likely progressive decline of dementia, family opted to focus on comfort measures on 2024/05/28. Her transfer to hospice care was delayed for bed availability, but comfort cares were provided on 28-May-2024.  Patient passed away at 6AM on 2024/06/01.     Results/Tests Pending at Time of Discharge:  Unresulted Labs (From admission, onward)    None      Disposition: In-hospital death  Discharge Exam:  Vitals:   01-Jun-2024 0036 06-01-24 0300  BP:    Pulse: (!) 124    Resp: 18 (!) 25  Temp:    SpO2: 90%    Death declared by RN x2.  Significant Procedures: None    Discharge Instructions: Please refer to Patient Instructions section of EMR for full details.  Patient was counseled important signs and symptoms that should prompt return to medical care, changes in medications, dietary instructions, activity restrictions, and follow up appointments.    Theophilus Pagan, MD 2024/06/01, 3:38 PM PGY-3, [facility]

## 2024-06-02 DEATH — deceased

## 2024-06-25 ENCOUNTER — Ambulatory Visit: Admitting: Physician Assistant

## 2024-06-25 ENCOUNTER — Encounter
